# Patient Record
Sex: Female | Born: 1935
Health system: Southern US, Community
[De-identification: ages and names within clinical notes are randomized; demographics above are authoritative.]

## PROBLEM LIST (undated history)

## (undated) DIAGNOSIS — G4733 Obstructive sleep apnea (adult) (pediatric): Secondary | ICD-10-CM

## (undated) DIAGNOSIS — Z9989 Dependence on other enabling machines and devices: Secondary | ICD-10-CM

## (undated) DIAGNOSIS — R51 Headache: Secondary | ICD-10-CM

## (undated) DIAGNOSIS — F32A Depression, unspecified: Secondary | ICD-10-CM

## (undated) DIAGNOSIS — J069 Acute upper respiratory infection, unspecified: Secondary | ICD-10-CM

## (undated) DIAGNOSIS — I517 Cardiomegaly: Secondary | ICD-10-CM

## (undated) DIAGNOSIS — I441 Atrioventricular block, second degree: Secondary | ICD-10-CM

## (undated) DIAGNOSIS — Z950 Presence of cardiac pacemaker: Secondary | ICD-10-CM

## (undated) DIAGNOSIS — I499 Cardiac arrhythmia, unspecified: Secondary | ICD-10-CM

## (undated) DIAGNOSIS — R519 Headache, unspecified: Secondary | ICD-10-CM

## (undated) DIAGNOSIS — G43109 Migraine with aura, not intractable, without status migrainosus: Secondary | ICD-10-CM

## (undated) DIAGNOSIS — G4730 Sleep apnea, unspecified: Secondary | ICD-10-CM

## (undated) DIAGNOSIS — M1990 Unspecified osteoarthritis, unspecified site: Secondary | ICD-10-CM

## (undated) DIAGNOSIS — N189 Chronic kidney disease, unspecified: Secondary | ICD-10-CM

## (undated) DIAGNOSIS — R0789 Other chest pain: Secondary | ICD-10-CM

## (undated) DIAGNOSIS — G8929 Other chronic pain: Secondary | ICD-10-CM

## (undated) DIAGNOSIS — E7800 Pure hypercholesterolemia, unspecified: Secondary | ICD-10-CM

## (undated) DIAGNOSIS — D649 Anemia, unspecified: Secondary | ICD-10-CM

## (undated) DIAGNOSIS — I10 Essential (primary) hypertension: Secondary | ICD-10-CM

## (undated) DIAGNOSIS — K219 Gastro-esophageal reflux disease without esophagitis: Secondary | ICD-10-CM

## (undated) DIAGNOSIS — E785 Hyperlipidemia, unspecified: Secondary | ICD-10-CM

## (undated) DIAGNOSIS — F329 Major depressive disorder, single episode, unspecified: Secondary | ICD-10-CM

## (undated) HISTORY — DX: Cardiomegaly: I51.7

## (undated) HISTORY — DX: Migraine with aura, not intractable, without status migrainosus: G43.109

## (undated) HISTORY — PX: FOOT SURGERY: SHX648

## (undated) HISTORY — DX: Headache, unspecified: R51.9

## (undated) HISTORY — DX: Pure hypercholesterolemia, unspecified: E78.00

## (undated) HISTORY — DX: Other chronic pain: G89.29

## (undated) HISTORY — PX: CARPAL TUNNEL RELEASE: SHX101

## (undated) HISTORY — DX: Hyperlipidemia, unspecified: E78.5

## (undated) HISTORY — PX: CATARACT EXTRACTION: SUR2

## (undated) HISTORY — DX: Essential (primary) hypertension: I10

## (undated) HISTORY — DX: Atrioventricular block, second degree: I44.1

## (undated) HISTORY — DX: Unspecified osteoarthritis, unspecified site: M19.90

## (undated) HISTORY — DX: Presence of cardiac pacemaker: Z95.0

## (undated) HISTORY — DX: Cardiac arrhythmia, unspecified: I49.9

## (undated) HISTORY — PX: HAND SURGERY: SHX662

## (undated) HISTORY — DX: Headache: R51

## (undated) HISTORY — DX: Chronic kidney disease, unspecified: N18.9

## (undated) HISTORY — PX: TOTAL KNEE ARTHROPLASTY: SHX125

## (undated) HISTORY — DX: Sleep apnea, unspecified: G47.30

## (undated) HISTORY — PX: KNEE SURGERY: SHX244

## (undated) HISTORY — DX: Obstructive sleep apnea (adult) (pediatric): G47.33

## (undated) HISTORY — DX: Other chest pain: R07.89

## (undated) HISTORY — DX: Dependence on other enabling machines and devices: Z99.89

---

## 1966-04-20 HISTORY — PX: TUBAL LIGATION: SHX77

## 1998-02-26 ENCOUNTER — Other Ambulatory Visit: Admission: RE | Admit: 1998-02-26 | Discharge: 1998-02-26 | Payer: Self-pay | Admitting: Obstetrics and Gynecology

## 1998-10-18 ENCOUNTER — Ambulatory Visit (HOSPITAL_COMMUNITY): Admission: RE | Admit: 1998-10-18 | Discharge: 1998-10-18 | Payer: Self-pay | Admitting: Gastroenterology

## 1998-10-18 ENCOUNTER — Encounter (INDEPENDENT_AMBULATORY_CARE_PROVIDER_SITE_OTHER): Payer: Self-pay | Admitting: Specialist

## 1999-05-13 ENCOUNTER — Other Ambulatory Visit: Admission: RE | Admit: 1999-05-13 | Discharge: 1999-05-13 | Payer: Self-pay | Admitting: Obstetrics and Gynecology

## 2000-05-14 ENCOUNTER — Other Ambulatory Visit: Admission: RE | Admit: 2000-05-14 | Discharge: 2000-05-14 | Payer: Self-pay | Admitting: Obstetrics and Gynecology

## 2000-11-14 ENCOUNTER — Ambulatory Visit (HOSPITAL_BASED_OUTPATIENT_CLINIC_OR_DEPARTMENT_OTHER): Admission: RE | Admit: 2000-11-14 | Discharge: 2000-11-14 | Payer: Self-pay | Admitting: Internal Medicine

## 2001-05-16 ENCOUNTER — Other Ambulatory Visit: Admission: RE | Admit: 2001-05-16 | Discharge: 2001-05-16 | Payer: Self-pay | Admitting: Obstetrics and Gynecology

## 2002-05-16 ENCOUNTER — Other Ambulatory Visit: Admission: RE | Admit: 2002-05-16 | Discharge: 2002-05-16 | Payer: Self-pay | Admitting: Obstetrics and Gynecology

## 2003-03-13 ENCOUNTER — Ambulatory Visit (HOSPITAL_COMMUNITY): Admission: RE | Admit: 2003-03-13 | Discharge: 2003-03-13 | Payer: Self-pay | Admitting: Gastroenterology

## 2003-03-13 ENCOUNTER — Encounter (INDEPENDENT_AMBULATORY_CARE_PROVIDER_SITE_OTHER): Payer: Self-pay | Admitting: *Deleted

## 2003-05-18 ENCOUNTER — Other Ambulatory Visit: Admission: RE | Admit: 2003-05-18 | Discharge: 2003-05-18 | Payer: Self-pay | Admitting: Obstetrics and Gynecology

## 2004-02-04 ENCOUNTER — Encounter (INDEPENDENT_AMBULATORY_CARE_PROVIDER_SITE_OTHER): Payer: Self-pay | Admitting: *Deleted

## 2004-02-04 ENCOUNTER — Ambulatory Visit (HOSPITAL_COMMUNITY): Admission: RE | Admit: 2004-02-04 | Discharge: 2004-02-04 | Payer: Self-pay | Admitting: Gastroenterology

## 2004-05-28 ENCOUNTER — Other Ambulatory Visit: Admission: RE | Admit: 2004-05-28 | Discharge: 2004-05-28 | Payer: Self-pay | Admitting: Obstetrics and Gynecology

## 2005-05-22 ENCOUNTER — Other Ambulatory Visit: Admission: RE | Admit: 2005-05-22 | Discharge: 2005-05-22 | Payer: Self-pay | Admitting: Obstetrics and Gynecology

## 2006-05-26 ENCOUNTER — Other Ambulatory Visit: Admission: RE | Admit: 2006-05-26 | Discharge: 2006-05-26 | Payer: Self-pay | Admitting: Obstetrics and Gynecology

## 2006-06-23 ENCOUNTER — Ambulatory Visit (HOSPITAL_COMMUNITY): Admission: RE | Admit: 2006-06-23 | Discharge: 2006-06-23 | Payer: Self-pay | Admitting: Gastroenterology

## 2008-04-20 HISTORY — PX: PACEMAKER INSERTION: SHX728

## 2008-06-04 ENCOUNTER — Encounter: Payer: Self-pay | Admitting: Cardiology

## 2008-06-04 ENCOUNTER — Inpatient Hospital Stay (HOSPITAL_COMMUNITY): Admission: EM | Admit: 2008-06-04 | Discharge: 2008-06-07 | Payer: Self-pay | Admitting: Emergency Medicine

## 2008-07-16 ENCOUNTER — Ambulatory Visit: Payer: Self-pay | Admitting: Obstetrics and Gynecology

## 2008-07-16 ENCOUNTER — Other Ambulatory Visit: Admission: RE | Admit: 2008-07-16 | Discharge: 2008-07-16 | Payer: Self-pay | Admitting: Obstetrics and Gynecology

## 2008-07-16 ENCOUNTER — Encounter: Payer: Self-pay | Admitting: Obstetrics and Gynecology

## 2009-02-15 ENCOUNTER — Ambulatory Visit: Payer: Self-pay | Admitting: Obstetrics and Gynecology

## 2009-02-18 ENCOUNTER — Ambulatory Visit: Payer: Self-pay | Admitting: Obstetrics and Gynecology

## 2009-02-28 ENCOUNTER — Ambulatory Visit: Payer: Self-pay | Admitting: Obstetrics and Gynecology

## 2009-07-31 ENCOUNTER — Ambulatory Visit: Payer: Self-pay | Admitting: Obstetrics and Gynecology

## 2010-02-16 ENCOUNTER — Encounter: Payer: Self-pay | Admitting: Internal Medicine

## 2010-02-17 ENCOUNTER — Ambulatory Visit: Payer: Self-pay | Admitting: Internal Medicine

## 2010-03-03 ENCOUNTER — Encounter: Payer: Self-pay | Admitting: Internal Medicine

## 2010-03-03 ENCOUNTER — Ambulatory Visit: Payer: Self-pay | Admitting: Cardiology

## 2010-03-18 ENCOUNTER — Ambulatory Visit (HOSPITAL_COMMUNITY)
Admission: RE | Admit: 2010-03-18 | Discharge: 2010-03-18 | Payer: Self-pay | Source: Home / Self Care | Admitting: Internal Medicine

## 2010-04-07 ENCOUNTER — Encounter: Payer: Self-pay | Admitting: Internal Medicine

## 2010-05-08 ENCOUNTER — Encounter
Admission: RE | Admit: 2010-05-08 | Discharge: 2010-05-20 | Payer: Self-pay | Source: Home / Self Care | Attending: Orthopedic Surgery | Admitting: Orthopedic Surgery

## 2010-05-20 NOTE — Cardiovascular Report (Signed)
Summary: Office Visit Remote   Office Visit Remote   Imported By: Roderic Ovens 03/03/2010 17:01:47  _____________________________________________________________________  External Attachment:    Type:   Image     Comment:   External Document

## 2010-05-20 NOTE — Letter (Signed)
Summary: Remote Device Check  [employer], Main Office  [address]   [place], [place] [address]   Phone: [phone]  Fax: [phone]     March 03, 2010 MRN: [id number]   [facility] [address] [place], [place]  [address]   Dear Ms. Handlin,   Your remote transmission was recieved and reviewed by your physician.  All diagnostics were within normal limits for you.  _____Your next transmission is scheduled for:                       .  Please transmit at any time this day.  If you have a wireless device your transmission will be sent automatically.  ___X___Your next office visit is scheduled for: February 2012 with Dr Johney Frame. Please call our office to schedule an appointment.    Sincerely,  Vella Kohler

## 2010-05-21 ENCOUNTER — Ambulatory Visit: Payer: Medicare Other | Attending: Internal Medicine | Admitting: Physical Therapy

## 2010-05-21 DIAGNOSIS — R262 Difficulty in walking, not elsewhere classified: Secondary | ICD-10-CM | POA: Insufficient documentation

## 2010-05-21 DIAGNOSIS — M25569 Pain in unspecified knee: Secondary | ICD-10-CM | POA: Insufficient documentation

## 2010-05-21 DIAGNOSIS — IMO0001 Reserved for inherently not codable concepts without codable children: Secondary | ICD-10-CM | POA: Insufficient documentation

## 2010-05-22 NOTE — Miscellaneous (Signed)
Summary: Device preload  Clinical Lists Changes  Observations: Added new observation of PPM INDICATN: Mobitz II (04/07/2010 11:48) Added new observation of MAGNET RTE: BOL 85 ERI 65 (04/07/2010 11:48) Added new observation of PPMLEADSTAT2: active (04/07/2010 11:48) Added new observation of PPMLEADSER2: [id number] (04/07/2010 11:48) Added new observation of PPMLEADMOD2: 5076  (04/07/2010 11:48) Added new observation of PPMLEADDOI2: 06/06/2008  (04/07/2010 11:48) Added new observation of PPMLEADLOC2: RV  (04/07/2010 11:48) Added new observation of PPMLEADSTAT1: active  (04/07/2010 11:48) Added new observation of PPMLEADSER1: [id number]  (04/07/2010 11:48) Added new observation of PPMLEADMOD1: 5076  (04/07/2010 11:48) Added new observation of PPMLEADDOI1: 06/06/2008  (04/07/2010 11:48) Added new observation of PPMLEADLOC1: RA  (04/07/2010 11:48) Added new observation of PPM DOI: 06/06/2008  (04/07/2010 11:48) Added new observation of PPM SERL#: [id number]  (04/07/2010 11:48) Added new observation of PPM MODL#: [id number]  (04/07/2010 96:29) Added new observation of PPM IMP MD: Charlynn Court  (04/07/2010 11:48) Added new observation of PACEMAKERMFG: Medtronic  (04/07/2010 11:48) Added new observation of PPM REFER MD: Villa Herb  (04/07/2010 11:48) Added new observation of PACEMAKER MD: Hillis Range, MD  (04/07/2010 11:48)      PPM Specifications Following MD:  Hillis Range, MD     Referring MD:  Villa Herb PPM Vendor:  Medtronic     PPM Model Number:  [id number]     PPM Serial Number:  [id number] PPM DOI:  06/06/2008     PPM Implanting MD:  Charlynn Court  Lead 1    Location: RA     DOI: 06/06/2008     Model #: [id number]     Serial #: [id number]     Status: active Lead 2    Location: RV     DOI: 06/06/2008     Model #: [id number]     Serial #: [id number]     Status: active  Magnet Response Rate:  BOL 85 ERI 65  Indications:  Mobitz II

## 2010-05-26 ENCOUNTER — Ambulatory Visit: Payer: Medicare Other | Admitting: Physical Therapy

## 2010-05-29 ENCOUNTER — Encounter: Payer: Medicare Other | Admitting: Physical Therapy

## 2010-06-02 ENCOUNTER — Encounter (INDEPENDENT_AMBULATORY_CARE_PROVIDER_SITE_OTHER): Payer: Medicare Other | Admitting: Internal Medicine

## 2010-06-02 ENCOUNTER — Encounter: Payer: Self-pay | Admitting: Internal Medicine

## 2010-06-02 DIAGNOSIS — E785 Hyperlipidemia, unspecified: Secondary | ICD-10-CM | POA: Insufficient documentation

## 2010-06-02 DIAGNOSIS — I10 Essential (primary) hypertension: Secondary | ICD-10-CM

## 2010-06-02 DIAGNOSIS — R609 Edema, unspecified: Secondary | ICD-10-CM | POA: Insufficient documentation

## 2010-06-02 DIAGNOSIS — I441 Atrioventricular block, second degree: Secondary | ICD-10-CM | POA: Insufficient documentation

## 2010-06-11 NOTE — Assessment & Plan Note (Signed)
Summary: gso card pt/mdt ppm/r.s from bumplist gd/kwb   Visit Type:  Device check Referring Provider:  Dr Patty Sermons   History of Present Illness: [name] is a pleasant 76 yo WF with a h/o mobitz II second degree AV block who presents to establish care in the EP device clinic.  She was found to have mobitz II AV block and underwent PPM implantation (MDT) by Dr Reyes Ivan 06/06/08.  She reports doing very well since having her pacemaker implantation.  She denies CP, palpitations, dizziness, presyncope, or syncope.  She reports dypsnea with moderate activity which is stable.  She denies orthopnea, or PND.  She has stable edema.  She has chronic bilateral knee pain which limits mobility.  She is otherwise without complaint today.  Current Medications (verified): 1)  Atenolol 50 Mg Tabs (Atenolol) .... Take 1 Tablet By Mouth Two Times A Day 2)  Benicar Hct 40-12.5 Mg Tabs (Olmesartan Medoxomil-Hctz) .... Take 1 Tablet By Mouth Once A Day 3)  Naproxen Dr 500 Mg Tbec (Naproxen) .... Take 1 Tablet By Mouth Two Times A Day 4)  Omeprazole 20 Mg Tbec (Omeprazole) .... Take 1 Tablet By Mouth Two Times A Day 5)  Simvastatin 40 Mg Tabs (Simvastatin) .... Take 1 Tablet By Mouth Once A Day 6)  Biotin 5000 5 Mg Caps (Biotin) .... Take 1 Capsule By Mouth Once A Day 7)  Calcium 600+d Plus Minerals 600-400 Mg-Unit Chew (Calcium Carbonate-Vit D-Min) .... Take 1 Tablet By Mouth Once A Day 8)  Fish Oil 1200 Mg Caps (Omega-3 Fatty Acids) .... Take 1 Capsule By Mouth Once A Day 9)  Iron 325 (65 Fe) Mg Tabs (Ferrous Sulfate) .... Take 1 Tablet By Mouth Once A Day 10)  Multivitamins  Tabs (Multiple Vitamin) .... Take 1 Tablet By Mouth Once A Day  Allergies (verified): No Known Drug Allergies  Past History:  Past Medical History: Mobitz II AV block s/p PPM (MDT) by Dr Reyes Ivan 06/06/08 obesity DJD HTN HL OSA  Past Surgical History: Mobitz II AV block s/p PPM (MDT) by Dr Reyes Ivan 06/06/08  Family  History: CAD  Social History: denies TED  Review of Systems       All systems are reviewed and negative except as listed in the HPI.   Vital Signs:  Patient profile:   76 year old female Height:      61 inches Weight:      219.75 pounds BMI:     41.67 Pulse rate:   71 / minute Pulse rhythm:   regular Resp:     18 per minute BP sitting:   138 / 73  (left arm) Cuff size:   large  Vitals Entered By: Vikki Ports (June 02, 2010 3:38 PM)  Physical Exam  General:  obese, pleasant Head:  normocephalic and atraumatic Eyes:  PERRLA/EOM intact; conjunctiva and lids normal. Mouth:  Teeth, gums and palate normal. Oral mucosa normal. Neck:  supple Lungs:  Clear bilaterally to auscultation and percussion. Heart:  RRR, no m/r/g Abdomen:  Bowel sounds positive; abdomen soft and non-tender without masses, organomegaly, or hernias noted. No hepatosplenomegaly. Msk:  Back normal, normal gait. Muscle strength and tone normal. Extremities:  No clubbing or cyanosis. 1+ BLE edema Neurologic:  Alert and oriented x 3. Skin:  Intact without lesions or rashes. Psych:  Normal affect.   PPM Specifications Following MD:  Hillis Range, MD     Referring MD:  Villa Herb PPM Vendor:  Medtronic     PPM  Model Number:  ADDRL1     PPM Serial Number:  [id number] PPM DOI:  06/06/2008     PPM Implanting MD:  Charlynn Court  Lead 1    Location: RA     DOI: 06/06/2008     Model #: 8413     Serial #: [id number]     Status: active Lead 2    Location: RV     DOI: 06/06/2008     Model #: 3664     Serial #: [id number]     Status: active  Magnet Response Rate:  BOL 85 ERI 65  Indications:  Mobitz II  MD Comments:  see scanned report  Impression & Recommendations:  Problem # 1:  MOBITZ II ATRIOVENTRICULAR BLOCK (ICD-426.12) normal pacemaker function see scanned report  Problem # 2:  ESSENTIAL HYPERTENSION, BENIGN (ICD-401.1) above goal likely worsened by NSAIDS avoid NSAIDS as  above consider tylenol for pain weight loss also advised salt restriction  Problem # 3:  EDEMA (ICD-782.3) likely worsened by NSAIDS avoid NSAIDS as able  Problem # 4:  HYPERLIPIDEMIA (ICD-272.4) stable Her updated medication list for this problem includes:    Simvastatin 40 Mg Tabs (Simvastatin) .Marland Kitchen... Take 1 tablet by mouth once a day  Patient Instructions: 1)  Your physician wants you to follow-up in:12 months with Dr.Dare Sanger   You will receive a reminder letter in the mail two months in advance. If you don't receive a letter, please call our office to schedule the follow-up appointment.

## 2010-06-26 NOTE — Cardiovascular Report (Signed)
Summary: Office Visit   Office Visit   Imported By: Roderic Ovens 06/20/2010 12:34:51  _____________________________________________________________________  External Attachment:    Type:   Image     Comment:   External Document

## 2010-08-05 LAB — CARDIAC PANEL(CRET KIN+CKTOT+MB+TROPI)
CK, MB: 1.8 ng/mL (ref 0.3–4.0)
CK, MB: 1.7 ng/mL (ref 0.3–4.0)
Relative Index: INVALID (ref 0.0–2.5)
Relative Index: INVALID (ref 0.0–2.5)
Total CK: 85 U/L (ref 7–177)
Total CK: 72 U/L (ref 7–177)
Troponin I: <0.01 ng/mL (ref 0.00–0.06)
Troponin I: <0.01 ng/mL (ref 0.00–0.06)

## 2010-08-05 LAB — CBC
HCT: 38.4 % (ref 36.0–46.0)
HCT: 36.6 % (ref 36.0–46.0)
Hemoglobin: 13.5 g/dL (ref 12.0–15.0)
Hemoglobin: 12.9 g/dL (ref 12.0–15.0)
MCHC: 35.2 g/dL (ref 30.0–36.0)
MCHC: 35.2 g/dL (ref 30.0–36.0)
MCV: 87.6 fL (ref 78.0–100.0)
MCV: 86.8 fL (ref 78.0–100.0)
Platelets: 247 10*3/uL (ref 150–400)
Platelets: 242 10*3/uL (ref 150–400)
RBC: 4.38 MIL/uL (ref 3.87–5.11)
RBC: 4.22 MIL/uL (ref 3.87–5.11)
RDW: 13.9 % (ref 11.5–15.5)
RDW: 13.7 % (ref 11.5–15.5)
WBC: 5.3 10*3/uL (ref 4.0–10.5)
WBC: 4.8 10*3/uL (ref 4.0–10.5)

## 2010-08-05 LAB — BASIC METABOLIC PANEL
BUN: 15 mg/dL (ref 6–23)
CO2: 23 mEq/L (ref 19–32)
Calcium: 9.7 mg/dL (ref 8.4–10.5)
Chloride: 110 mEq/L (ref 96–112)
Creatinine, Ser: 0.92 mg/dL (ref 0.4–1.2)
GFR calc Af Amer: >60 mL/min (ref >60)
GFR calc non Af Amer: >60 mL/min (ref >60)
Glucose, Bld: 111 mg/dL — ABNORMAL HIGH (ref 70–99)
Potassium: 4.3 mEq/L (ref 3.5–5.1)
Sodium: 140 mEq/L (ref 135–145)

## 2010-08-05 LAB — APTT: aPTT: 29 seconds (ref 24–37)

## 2010-08-05 LAB — GLUCOSE, CAPILLARY
Glucose-Capillary: 80 mg/dL (ref 70–99)
Glucose-Capillary: 92 mg/dL (ref 70–99)

## 2010-08-05 LAB — DIFFERENTIAL
Basophils Absolute: 0.1 10*3/uL (ref 0.0–0.1)
Basophils Relative: 1 % (ref 0–1)
Eosinophils Absolute: 0.3 10*3/uL (ref 0.0–0.7)
Eosinophils Relative: 5 % (ref 0–5)
Lymphocytes Relative: 35 % (ref 12–46)
Lymphs Abs: 1.9 10*3/uL (ref 0.7–4.0)
Monocytes Absolute: 0.5 10*3/uL (ref 0.1–1.0)
Monocytes Relative: 9 % (ref 3–12)
Neutro Abs: 2.6 10*3/uL (ref 1.7–7.7)
Neutrophils Relative %: 49 % (ref 43–77)

## 2010-08-05 LAB — COMPREHENSIVE METABOLIC PANEL
ALT: 13 U/L (ref 0–35)
AST: 26 U/L (ref 0–37)
Albumin: 3.8 g/dL (ref 3.5–5.2)
Alkaline Phosphatase: 24 U/L — ABNORMAL LOW (ref 39–117)
BUN: 14 mg/dL (ref 6–23)
CO2: 25 mEq/L (ref 19–32)
Calcium: 9.8 mg/dL (ref 8.4–10.5)
Chloride: 108 mEq/L (ref 96–112)
Creatinine, Ser: 0.98 mg/dL (ref 0.4–1.2)
GFR calc Af Amer: >60 mL/min (ref >60)
GFR calc non Af Amer: 56 mL/min — ABNORMAL LOW (ref >60)
Glucose, Bld: 102 mg/dL — ABNORMAL HIGH (ref 70–99)
Potassium: 4.6 mEq/L (ref 3.5–5.1)
Sodium: 141 mEq/L (ref 135–145)
Total Bilirubin: 0.8 mg/dL (ref 0.3–1.2)
Total Protein: 6.6 g/dL (ref 6.0–8.3)

## 2010-08-05 LAB — T4, FREE: Free T4: 1.1 ng/dL (ref 0.89–1.80)

## 2010-08-05 LAB — LIPID PANEL
Cholesterol: 171 mg/dL (ref 0–200)
HDL: 47 mg/dL (ref >39)
LDL Cholesterol: 95 mg/dL (ref 0–99)
Total CHOL/HDL Ratio: 3.6 RATIO
Triglycerides: 144 mg/dL (ref <150)
VLDL: 29 mg/dL (ref 0–40)

## 2010-08-05 LAB — BRAIN NATRIURETIC PEPTIDE: Pro B Natriuretic peptide (BNP): 151 pg/mL — ABNORMAL HIGH (ref 0.0–100.0)

## 2010-08-05 LAB — TROPONIN I: Troponin I: <0.01 ng/mL (ref 0.00–0.06)

## 2010-08-05 LAB — HEMOGLOBIN A1C
Hgb A1c MFr Bld: 5.7 % (ref 4.6–6.1)
Mean Plasma Glucose: 117 mg/dL

## 2010-08-05 LAB — CK TOTAL AND CKMB (NOT AT ARMC)
CK, MB: 2.2 ng/mL (ref 0.3–4.0)
Relative Index: INVALID (ref 0.0–2.5)
Total CK: 89 U/L (ref 7–177)

## 2010-08-05 LAB — HEMOCCULT GUIAC POC 1CARD (OFFICE): Fecal Occult Bld: NEGATIVE

## 2010-08-05 LAB — PROTIME-INR
INR: 1 (ref 0.00–1.49)
Prothrombin Time: 13.4 seconds (ref 11.6–15.2)

## 2010-08-05 LAB — POCT CARDIAC MARKERS
CKMB, poc: 1.2 ng/mL (ref 1.0–8.0)
Myoglobin, poc: 75.3 ng/mL (ref 12–200)
Troponin i, poc: <0.05 ng/mL (ref 0.00–0.09)

## 2010-08-05 LAB — D-DIMER, QUANTITATIVE: D-Dimer, Quant: 0.38 ug/mL-FEU (ref 0.00–0.48)

## 2010-08-05 LAB — TSH: TSH: 2.64 u[IU]/mL (ref 0.350–4.500)

## 2010-08-05 LAB — MAGNESIUM: Magnesium: 2.3 mg/dL (ref 1.5–2.5)

## 2010-08-06 ENCOUNTER — Encounter: Payer: Self-pay | Admitting: Cardiology

## 2010-08-07 ENCOUNTER — Other Ambulatory Visit: Payer: Self-pay | Admitting: Obstetrics and Gynecology

## 2010-08-07 ENCOUNTER — Encounter: Payer: Medicare Other | Admitting: Obstetrics and Gynecology

## 2010-08-07 ENCOUNTER — Encounter (INDEPENDENT_AMBULATORY_CARE_PROVIDER_SITE_OTHER): Payer: Medicare Other | Admitting: Obstetrics and Gynecology

## 2010-08-07 ENCOUNTER — Other Ambulatory Visit (HOSPITAL_COMMUNITY)
Admission: RE | Admit: 2010-08-07 | Discharge: 2010-08-07 | Disposition: A | Discharge status: Home / Self Care | Payer: Medicare Other | Source: Ambulatory Visit | Attending: Obstetrics and Gynecology | Admitting: Obstetrics and Gynecology

## 2010-08-07 DIAGNOSIS — B373 Candidiasis of vulva and vagina: Secondary | ICD-10-CM

## 2010-08-07 DIAGNOSIS — N898 Other specified noninflammatory disorders of vagina: Secondary | ICD-10-CM

## 2010-08-07 DIAGNOSIS — Z124 Encounter for screening for malignant neoplasm of cervix: Secondary | ICD-10-CM | POA: Insufficient documentation

## 2010-08-07 DIAGNOSIS — N3941 Urge incontinence: Secondary | ICD-10-CM

## 2010-08-07 DIAGNOSIS — N390 Urinary tract infection, site not specified: Secondary | ICD-10-CM

## 2010-08-19 ENCOUNTER — Encounter: Payer: Self-pay | Admitting: Internal Medicine

## 2010-08-19 ENCOUNTER — Ambulatory Visit (INDEPENDENT_AMBULATORY_CARE_PROVIDER_SITE_OTHER): Payer: Medicare Other | Admitting: Internal Medicine

## 2010-08-19 VITALS — BP 126/70 | HR 69 | Ht 61.0 in | Wt 219.0 lb

## 2010-08-19 DIAGNOSIS — G4733 Obstructive sleep apnea (adult) (pediatric): Secondary | ICD-10-CM

## 2010-08-19 DIAGNOSIS — K219 Gastro-esophageal reflux disease without esophagitis: Secondary | ICD-10-CM

## 2010-08-19 DIAGNOSIS — R05 Cough: Secondary | ICD-10-CM

## 2010-08-19 NOTE — Progress Notes (Signed)
Subjective:    Patient ID: [name], female    DOB: 08-Jan-1935, 76 y.o.   MRN: [id number]  HPI 58 yoF here with husband on kind referral by Dr Eloise Harman for pulmonary consultation because of cough. Never smoker. Describes onset of cough about 2 years ago. Persistent through most days, without defined aggravating factors. Had seasonal allergic rhinitis mainly in Spring years ago, but denies hx of asthma or pneumonia.  PFT at [facility] 03/18/10 showed mild reversible small  airway obstruction. Has been treated for GERD with dilatation of stricture twice, most recently 2 years ago- she isn't sure if that timing relates to onset of cough. Increasing omeprazole to twice daily may have helped dough. Notes cough on first waking in AM, then through day. Not related to eating. Good and bad days with no pattern. Feels throat tickle then coughs, without any sense of deep chest involvement. Denies nose or sinus discomfort, drainage, etc. Some short of breath with activity, but without wheeze and she attributes it to her level of fitness and obesity. Has OSA/ CPAP- she doesn't think this is the problem. Coughs little at night. CXR 03/03/10- reported mild CE. Pacemaker, spondylosis. No active process.   Review of Systems See HPI Constitutional:   No weight loss, night sweats,  Fevers, chills, fatigue, lassitude. HEENT:   No headaches,  Difficulty swallowing,  Tooth/dental problems,  Sore throat,                No sneezing, itching, ear ache, nasal congestion, post nasal drip,   CV:  No chest pain,  Orthopnea, PND, swelling in lower extremities, anasarca, dizziness, palpitations  GI  No heartburn, indigestion, abdominal pain, nausea, vomiting, diarrhea, change in bowel habits, loss of appetite  Resp: No acute shortness of breath with exertion or at rest.  No excess mucus, no productive cough,   No coughing up of blood.  No change in color of mucus.  No wheezing.    Skin: no rash or lesions.  GU: no dysuria,  change in color of urine, no urgency or frequency.  No flank pain.  MS:  No joint pain or swelling.  No decreased range of motion.  No back pain.  Psych:  No change in mood or affect. No depression or anxiety.  No memory loss.      Objective:   Physical Exam General- Alert, Oriented, Affect-appropriate, Distress- none acute,  obese  Skin- rash-none, lesions- none, excoriation- none  Lymphadenopathy- none  Head- atraumatic  Eyes- Gross vision intact, PERRLA, conjunctivae clear secretions  Ears- Normal- Hearing, canals, Tm L ,   R ,  Nose- Clear, No-Septal dev, mucus, polyps, erosion, perforation   Throat- Mallampati IV , mucosa clear , drainage- none, tonsils- atrophic  Neck- flexible , trachea midline, no stridor , thyroid nl, carotid no bruit  Chest - symmetrical excursion , unlabored     Heart/CV- RRR , no murmur , no gallop  , no rub, nl s1 s2                     - JVD- none , edema- none, stasis changes- none, varices- none     Lung- Trace crackle in bases, unlabored, wheeze- none, cough- none , dullness-none, rub- none     Chest wall- Abd- tender-no, distended-no, bowel sounds-present, HSM- no  Br/ Gen/ Rectal- Not done, not indicated  Extrem- cyanosis- none, clubbing, none, atrophy- none, strength- nl  Neuro- grossly intact to observation  Assessment & Plan:

## 2010-08-19 NOTE — Patient Instructions (Signed)
Sample Spiriva once daily. Please call and let us know if this helps.  Try using a sugar-free hard candy instead of peppermint

## 2010-08-21 ENCOUNTER — Encounter: Payer: Self-pay | Admitting: *Deleted

## 2010-08-22 ENCOUNTER — Ambulatory Visit (INDEPENDENT_AMBULATORY_CARE_PROVIDER_SITE_OTHER): Payer: Medicare Other | Admitting: Cardiology

## 2010-08-22 ENCOUNTER — Encounter: Payer: Self-pay | Admitting: Cardiology

## 2010-08-22 VITALS — BP 130/72 | HR 67 | Wt 218.0 lb

## 2010-08-22 DIAGNOSIS — M1990 Unspecified osteoarthritis, unspecified site: Secondary | ICD-10-CM | POA: Insufficient documentation

## 2010-08-22 DIAGNOSIS — I119 Hypertensive heart disease without heart failure: Secondary | ICD-10-CM

## 2010-08-22 DIAGNOSIS — I10 Essential (primary) hypertension: Secondary | ICD-10-CM

## 2010-08-22 NOTE — Assessment & Plan Note (Signed)
This elderly woman has been experiencing severe pain in her right knee.  She is scheduled for right total knee replacement on 11/17/10.  She comes in for preoperative cardiac clearance.  She has not been expressing any recent chest pain or shortness of breath.  She does have a history of atrioventricular node disease and has a functioning dual-chamber pacemaker.

## 2010-08-22 NOTE — Progress Notes (Signed)
[name] Date of Birth:  12-18-1934 [facility] [address]   [address] [place], [place]  [address] [phone]           Fax   [phone]  HPI: This pleasant 76 year old Caucasian female is seen for a six-month followup office visit and for preoperative cardiac clearance prior to scheduled right total knee replacement on 11/17/10.  She has a past history of intermittent high-grade AV block and has a functioning dual-chamber pacemaker.  She has a history of essential hypertension and history of hypercholesterolemia.  She's also had a history of an intermittent cough she does have a CPAP machine.  She had recent pulmonary function studies which were done at [facility] which she states was okay.  She recently saw Dr. Levonne Spiller young who Gave her a trial of Spiriva.  She is not on ACE inhibitors.  She does not have any history of ischemic heart disease.  She had a normal LexiScan Cardiolite stress test on 05/24/09.  This was done as preop clearance for hand surgery.  She had a echocardiogram on 06/04/08 which showed normal ejection fraction of 65% and no left ventricular regional wall motion abnormalities.  Left ventricular diastolic function parameters were normal.   Current Outpatient Prescriptions  Medication Sig Dispense Refill  . amLODipine (NORVASC) 5 MG tablet Take 5 mg by mouth daily.        Marland Kitchen aspirin 81 MG tablet Take 81 mg by mouth daily.        Marland Kitchen atenolol (TENORMIN) 50 MG tablet Take 100 mg by mouth daily.        . Biotin 5000 MCG TABS Take 1 tablet by mouth daily.        . Calcium Carbonate-Vitamin D (CALCIUM 600+D) 600-400 MG-UNIT per tablet Take 1 tablet by mouth daily.        . Multiple Vitamin (MULTIVITAMIN PO) Take 1 tablet by mouth daily.        . naproxen (NAPROSYN) 500 MG tablet Take 500 mg by mouth 2 (two) times daily with a meal.        . Omega-3 Fatty Acids (FISH OIL) 1200 MG CAPS Take 1 capsule by mouth daily.        Marland Kitchen omeprazole  (PRILOSEC) 20 MG capsule Take 20 mg by mouth 2 (two) times daily.       . simvastatin (ZOCOR) 40 MG tablet Take 40 mg by mouth at bedtime.        Marland Kitchen telmisartan-hydrochlorothiazide (MICARDIS HCT) 80-12.5 MG per tablet Take 1 tablet by mouth daily.        . Tiotropium Bromide Monohydrate (SPIRIVA HANDIHALER IN) Inhale into the lungs.        . Ferrous Sulfate (IRON) 325 (65 FE) MG TABS Take 1 tablet by mouth daily.          Allergies  Allergen Reactions  . Amoxicillin     Patient Active Problem List  Diagnoses  . HYPERLIPIDEMIA  . Essential hypertension, benign  . MOBITZ II ATRIOVENTRICULAR BLOCK  . EDEMA  . Osteoarthritis    History  Smoking status  . Never Smoker   Smokeless tobacco  . Not on file    History  Alcohol Use: Not on file    Family History  Problem Relation Age of Onset  . Emphysema Father   . Allergies Father   . Heart disease Father   . Heart disease Mother     Review of Systems: The  patient denies any heat or cold intolerance.  No weight gain or weight loss.  The patient denies headaches or blurry vision.  There is no cough or sputum production.  The patient denies dizziness.  There is no hematuria or hematochezia.  The patient denies any muscle aches or arthritis.  The patient denies any rash.  The patient denies frequent falling or instability.  There is no history of depression or anxiety.  All other systems were reviewed and are negative.   Physical Exam: Filed Vitals:   08/22/10 1033  BP: 130/72  Pulse: 67  The general appearance feels a well-developed well-nourished woman in no distress.Pupils equal and reactive.   Extraocular Movements are full.  There is no scleral icterus.  The mouth and pharynx are normal.  The neck is supple.  The carotids reveal no bruits.  The jugular venous pressure is normal.  The thyroid is not enlarged.  There is no lymphadenopathy.The chest is clear to percussion and auscultation. There are no rales or rhonchi.  Expansion of the chest is symmetrical.The precordium is quiet.  The first heart sound is normal.  The second heart sound is physiologically split.  There is no murmur gallop rub or click.  There is no abnormal lift or heave.The abdomen is soft and nontender. Bowel sounds are normal. The liver and spleen are not enlarged. There Are no abdominal masses. There are no bruits.The pedal pulses are good.  There is no phlebitis or edema.  There is no cyanosis or clubbing.Strength is normal and symmetrical in all extremities.  There is no lateralizing weakness.  There are no sensory deficits.The skin is warm and dry.  There is no rash.  EKG today shows an AV paced rhythm at 67 per minute.    Assessment / Plan:  My impression is that the patient is doing well.  She is cleared for orthopedic surgery.  No further cardiac testing needs to be done.  She is to continue her same medications.  She'll be rechecked here in 6 months.

## 2010-08-22 NOTE — Assessment & Plan Note (Signed)
The patient has a past history of essential hypertension.  He is presently on Micardis HCT 80 mg/12.5 mg one daily and her blood pressure has been remaining stable she's not having the dizzy spells or syncope.

## 2010-08-23 ENCOUNTER — Encounter: Payer: Self-pay | Admitting: Internal Medicine

## 2010-08-23 DIAGNOSIS — G4733 Obstructive sleep apnea (adult) (pediatric): Secondary | ICD-10-CM | POA: Insufficient documentation

## 2010-08-23 DIAGNOSIS — R05 Cough: Secondary | ICD-10-CM | POA: Insufficient documentation

## 2010-08-23 DIAGNOSIS — K219 Gastro-esophageal reflux disease without esophagitis: Secondary | ICD-10-CM | POA: Insufficient documentation

## 2010-08-23 NOTE — Assessment & Plan Note (Signed)
She reports compliance with CPAP

## 2010-08-23 NOTE — Assessment & Plan Note (Signed)
Nonspecific cough. Clues are her small airways reactive disease, seen on PFT, without previous asthma. This may respond to a bronchodilator. Also there may be relation to her hx of GERD with stricture dilatation, although she doesn't recognize it. I have asked her to stop use of peppermint lozenges, which may relax the LES. We will try Spiriva. If that doesn't help consider methacholine challenge.

## 2010-08-29 ENCOUNTER — Encounter: Payer: Self-pay | Admitting: Internal Medicine

## 2010-09-02 ENCOUNTER — Telehealth: Payer: Self-pay | Admitting: Internal Medicine

## 2010-09-02 NOTE — H&P (Signed)
NAMEKAMRIN, SPATH               ACCOUNT NO.:  [id number]   MEDICAL RECORD NO.:  [id number]          PATIENT TYPE:  INP   LOCATION:  2039                         FACILITY:  [facility]   PHYSICIAN:  Cassell Clement, M.D. DATE OF BIRTH:  1935/02/04   DATE OF ADMISSION:  06/04/2008  DATE OF DISCHARGE:                              HISTORY & PHYSICAL   ADDENDUM   PHYSICAL EXAMINATION:  VITAL SIGNS:  Blood pressure is 156/80, pulse is  76 and regular with transient drop in pulse to 38 on telemetry monitor.  The respirations are normal.  GENERAL APPEARANCE:  A well-developed, somewhat overweight woman in no  acute distress.  SKIN:  Warm and dry.  HEENT:  The pupils are equal and reactive.  The extraocular movements  are full.  The sclerae are nonicteric.  Mouth and pharynx are normal.  NECK:  The jugular venous pressure is normal.  Carotids normal.  Thyroid  normal.  No lymphadenopathy.  CHEST:  Clear to percussion and auscultation.  HEART:  A quiet precordium.  The first sound is normal, second sound is  normal.  There is no murmur, gallop, rub, or click.  ABDOMEN:  Obese, nontender.  No hepatosplenomegaly.  No palpable mass.  The bowel sounds are active.  EXTREMITIES:  Good pedal pulses.  There is no phlebitis or edema.  There  is no cyanosis or clubbing.  There is no skin rash.  NEUROLOGIC:  Motor strength and sensation are intact.  The patient is  alert and oriented.           ______________________________  Cassell Clement, M.D.     TB/MEDQ  D:  06/05/2008  T:  06/06/2008  Job:  [id number]   cc:   Barry Dienes. Eloise Harman, M.D.

## 2010-09-02 NOTE — Telephone Encounter (Signed)
Spoke with pt.  She states that she has finished spiriva samples given to her at last ov on 08/19/10- she states that she feels that spiriva has helped with her am wheezing and her cough.  She would like rx for this sent to [facility]. She also wanted to let Dr Maple Hudson know that she is now taking micardis HCT for her BP. Med has already been added to her list. Pls advise if okay to send rx for spiriva, thanks! Allergies  Allergen Reactions  . Amoxicillin

## 2010-09-02 NOTE — H&P (Signed)
[name], [name]               ACCOUNT NO.:  [id number]   MEDICAL RECORD NO.:  [id number]          PATIENT TYPE:  OBV   LOCATION:  2039                         FACILITY:  [facility]   PHYSICIAN:  Cassell Clement, M.D. DATE OF BIRTH:  [birth year]   DATE OF ADMISSION:  06/04/2008  DATE OF DISCHARGE:                              HISTORY & PHYSICAL   CHIEF COMPLAINT:  Chest pain.   HISTORY OF PRESENT ILLNESS:  This is a 76 year old married Caucasian  female admitted with substernal chest pain.  She had the onset of the  discomfort 2 days ago on Saturday night, June 02, 2008, at around 7  p.m.  She was washing dishes at that time and noted a heaviness in her  chest associated with dyspnea.  She has sleep apnea and put herself on a  CPAP machine and obtained relief.  On the following evening, last  evening, she again had similar symptoms in the late afternoon and  evening and again put herself on her CPAP machine with relief of dyspnea  and chest discomfort.  This morning, she again had onset of chest  pressure and called Dr. Reuel Boom Paterson's office who advised her to come  to the emergency room.  The patient does not have any history of known  coronary artery disease.  She was seen in our office for the last time  on March 06, 2003, and at that time had a 1-day adenosine Cardiolite  SPECT imaging study which was negative for ischemia and showed LV  function to be vigorous.  She does have a history of hypertension.  She  has been on atenolol uncertain dose each evening and Benicar uncertain  dose each morning.  Other home meds include naproxen 500 mg b.i.d.,  omeprazole 20 mg daily, simvastatin uncertain dose at bedtime and she  also takes vitamins, fish oil, calcium, iron, and macular protection  plus multivitamin.   The social history reveals that she is retired from [employer] for  [employer]  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  She is  married, has 3 grown children.  She does not  use alcohol or tobacco.   The family history reveals her father had several heart attacks and died  of emphysema at 65.  Mother had an angina pectoris, but lived to age 53.   Previous surgery includes 2 cesarean sections, left knee arthroscopy,  and cataract surgery.   REVIEW OF SYSTEMS:  The patient does have a history of  hypercholesterolemia.  She has a history of obesity.  Her present weight  is about 210.  The review of systems reveals that the chest discomfort  that she experienced in the last 3 days intermittently was substernal,  did not radiate, was not associated with any nausea, vomiting, or  diaphoresis.   The gastrointestinal function reveals no constipation or change in bowel  habits, hematochezia, or melena.  She has urinary frequency during the  day, but rarely has any nocturia at night.  She has had a slight cough,  not productive of any sputum  and she did notice that when she coughed  the symptoms seemed to improve a little 2 nights ago despite the fact  she did not bring up any sputum.  The patient denies any dizziness or  syncope or Stokes-Adams attacks.   All other systems negative in detail.   Her chest x-ray shows cardiomegaly.  Her EKG shows normal sinus rhythm,  first-degree heart block with right bundle-branch block and she had a  right bundle-branch block in 2004 as well.  Her D-dimer is normal.  Initial point-of-care cardiac enzymes are normal.  Her white count is  53,00, hemoglobin 13.5, and platelets 247,000.  Myoglobin 75.  CK-MB is  1.2.  Troponin less than 0.05.  Sodium 143, potassium 4.3, blood sugar  111, BUN 15, and creatinine 0.92.  Occult blood is negative  Her D-dimer  is 0.38.  Protime is 13.4.  PTT 29.   IMPRESSION:  1. Atypical chest pain, rule out myocardial infarction, rule out      angina pectoris.  2. Transient second-degree heart block noted on the ER monitor during      the  examination.  It was transient, but it did reproduce somewhat      the symptoms that she had experienced earlier.  3. Old right bundle-branch block.  4. Essential hypertension with cardiomegaly.  5. Hyperlipidemia.  6. Sleep apnea.  7. Osteoarthritis.  8. Macular degeneration.   DISPOSITION:  We are admitting her to telemetry observation status.  We  will try to get a 2-D echo today to evaluate cardiomegaly.  We will get  serial cardiac enzymes.  We will check a B-natriuretic peptide and A1c  and lipid panel.  We will continue her home meds except for stopping her  atenolol because of the transient second-degree heart block noted on the  monitor.           ______________________________  Cassell Clement, M.D.     TB/MEDQ  D:  06/04/2008  T:  06/05/2008  Job:  [id number]   cc:   Barry Dienes. Eloise Harman, M.D.

## 2010-09-02 NOTE — Discharge Summary (Signed)
[name], [name]               ACCOUNT NO.:  [id number]   MEDICAL RECORD NO.:  [id number]          PATIENT TYPE:  INP   LOCATION:  2039                         FACILITY:  [facility]   PHYSICIAN:  Cassell Clement, M.D. DATE OF BIRTH:  [birth year]   DATE OF ADMISSION:  06/04/2008  DATE OF DISCHARGE:  06/07/2008                               DISCHARGE SUMMARY   FINAL DIAGNOSES:  1. Second-degree atrioventricular block.  2. Old right bundle-branch block.  3. Essential hypertension.  4. Cardiomegaly.  5. Atypical chest pain, myocardial infarction ruled out.  6. Hyperlipidemia.  7. Sleep apnea.  8. Osteoarthritis.  9. Macular degeneration.   OPERATIONS PERFORMED:  1. Two-dimensional echocardiogram.  2. Insertion of Medtronic dual-chamber pacemaker by Dr. Lady Deutscher      on June 06, 2008, for symptomatic second-degree heart block.   HISTORY:  This is a 76 year old Caucasian female admitted with atypical  chest pain.  She had been experiencing intermittent chest discomfort  associated with a heaviness in the chest and dyspnea for the past 48  hours.  Interestingly, she would put herself on her CPAP machine and  that seemed to give her relief.  On the morning of admission, she had  recurrence of the chest pressure and was advised to come to the  emergency room.  She denies any radiation of discomfort to the arms.  There has been no nausea, vomiting, or diaphoresis.  No fever, chills,  or bronchitis.   Home meds including atenolol, Benicar, Naprosyn, omeprazole,  simvastatin, and multiple vitamins and supplements.   PHYSICAL EXAMINATION:  VITAL SIGNS:  On admission, blood pressure of  150/80, pulse was 68 with normal sinus rhythm, and she was having short  runs of 3:1 heart block with pulse dropping into the high 30s and low  40s.  HEAD AND NECK:  Unremarkable.  Jugular venous pressure normal.  Carotids  normal.  LUNGS:  Clear.  HEART:  No murmur, gallop, or rub.  ABDOMEN:   Soft and nontender.  EXTREMITIES:  Good peripheral pulses.  No phlebitis or edema.  NEUROLOGIC:  Physiologic.   On admission, her chest x-ray showed cardiomegaly, but no CHF.  EKG  showed normal sinus rhythm with a first-degree heart block and with  intermittent second-degree block and she had a chronic right bundle-  branch block.  Her D-dimer was normal.  Cardiac point of care enzymes  were negative x1 on admission.   HOSPITAL COURSE:  The patient was admitted to telemetry.  She underwent  a 2-dimensional echocardiogram on the day of admission which showed good  left ventricular systolic function and no significant valve  abnormalities.  We stopped her atenolol on admission because of the  presence of his second-degree heart block.  We continued her on ARBs.  The patient continued to have runs of second-degree block.  It seemed  that her symptoms of chest heaviness were correlated with the episodes  of second-degree block and that her symptoms were related to her AV  block.  We looked for reversible causes of the AV block.  She was no  longer on  any beta blocker, but the AV block persisted.  Thyroid  function was normal.  It was felt that she would benefit from a dual-  chamber pacemaker and she was seen by Dr. Lady Deutscher, who felt that a  dual-chamber permanent pacemaker was indicated.  This was implanted by  Dr. Reyes Ivan on June 06, 2008.  It is a Medtronic unit.  The procedure  went well.  The following morning, she had a chest x-ray which showed no  pneumothorax and the pacemaker was interrogated and showed that it was  functioning normally.  The incision looked good.  Blood pressure  remained elevated and we are going to resume her beta blocker at  discharge to help normalize blood pressure.   The patient will be sent home with careful instructions regarding  limitation of movement of the left arm for the next week and with  instructions not to get the incision wet.  The  incision will be painted  with Betadine daily for 3 days at home.  The patient will return to work  on Wednesday, June 20, 2008.  The patient will be seen in my office in 1  week for a followup office visit and EKG.   DISCHARGE MEDICATIONS:  1. Benicar 40 mg daily.  2. Atenolol 50 mg nightly.  3. Aspirin 81 mg daily.  4. Naprosyn 500 mg twice a day.  5. Omeprazole 20 mg daily.  6. Simvastatin 40 mg nightly.  7. Tylenol as needed for pain.  Continue home multivitamins and      supplements and she will continue use of her CPAP machine at home.   TIME SPENT IN DISCUSSING DISCHARGE PLANS AND PREPARING DISCHARGE PAPERS:  30 minutes.   CONDITION ON DISCHARGED:  Improved.           ______________________________  Cassell Clement, M.D.     TB/MEDQ  D:  06/07/2008  T:  06/07/2008  Job:  [id number]   cc:   Elmore Guise., M.D.  Barry Dienes Eloise Harman, M.D.

## 2010-09-03 MED ORDER — TIOTROPIUM BROMIDE MONOHYDRATE 18 MCG IN CAPS: ORAL_CAPSULE | RESPIRATORY_TRACT | Status: DC

## 2010-09-03 NOTE — Telephone Encounter (Signed)
Lmomtcb x 1 

## 2010-09-03 NOTE — Telephone Encounter (Signed)
Ok to Rx Spiriva, # 30, 1 daily, refill prn

## 2010-09-04 ENCOUNTER — Encounter: Payer: Medicare Other | Admitting: *Deleted

## 2010-09-04 NOTE — Telephone Encounter (Signed)
Spoke w/ pt and she is aware rx was sent to pharmacy and needed nothing else further

## 2010-09-05 ENCOUNTER — Encounter: Payer: Self-pay | Admitting: *Deleted

## 2010-09-05 NOTE — Op Note (Signed)
NAMEKRISLYNN, [name]               ACCOUNT NO.:  [id number]   MEDICAL RECORD NO.:  [id number]          PATIENT TYPE:  AMB   LOCATION:  ENDO                         FACILITY:  [facility]   PHYSICIAN:  Petra Kuba, M.D.    DATE OF BIRTH:  06/04/1934   DATE OF PROCEDURE:  06/23/2006  DATE OF DISCHARGE:                               OPERATIVE REPORT   PROCEDURE:  Esophagogastroduodenoscopy with Savory dilatation.   INDICATION:  Recurrent dysphagia.  History of hiatal hernia and  stricture.  Consent was signed after risks, benefits, methods and  options thoroughly discussed multiple times in the past.   MEDICINES USED:  Fentanyl 50 mcg, Versed 5 mg.   PROCEDURE:  The video endoscope was inserted by direct vision.  The  proximal main esophagus was normal.  There was a widely patent ring just  above the small hiatal hernia.  The scope passed easily through and into  the stomach.  Advanced through a normal antrum, normal pylorus, into a  normal duodenal bulb and around the celiac to a normal second portion of  the duodenum.  The scope was slowly withdrawn back to the bulb and a  good look there ruled out abnormalities in that location.  The scope was  withdrawn back into the stomach and retroflexed.  Annularis, cardia,  fundus, lesser and greater curve were all normal, except for the hiatal  hernia being confirmed in the cardia.  Straight visualization of the  stomach did not reveal any additional findings. The scope was slowly  withdrawn back to 20 cm, again confirming the normal esophagus.  The  scope was readvanced into the stomach.  Under fluoro guidance, the  Savory wire was advanced and customary J loop was confirmed  endoscopically and under fluoro, scope was removed, making sure to keep  the wire in the proper location and then succession of the Savory 14 and  then 16 mm dilators were advanced and confirmed in proper position in  the stomach under fluoro.  Once the 16 was advanced  into the stomach,  the wire was withdrawn back into the dilator.  Both were removed in  tandem.  The procedure was terminated at this junction.  Both dilators  were passed with very minimal resistance and only trace of heme on the  16.  The patient tolerated the procedure well.  There was no obvious  immediate complication.   ENDOSCOPIC DIAGNOSES:  1. Small hiatal hernia with a widely patent ring.  2. Otherwise normal esophagogastroduodenoscopy and therapy Savory      dilatations seen under fluoroscopy.   PLAN:  Seeing how the dilation worked, followup with me p.r.n. or in 6  weeks and recheck symptoms to make sure no further workup plans or more  aggressive dilatations are needed.           ______________________________  Petra Kuba, M.D.     MEM/MEDQ  D:  06/23/2006  T:  06/23/2006  Job:  [id number]   cc:   Vania Rea. Jarold Motto, MD, Caleen Essex, FAGA

## 2010-09-05 NOTE — Op Note (Signed)
[name], [name]               ACCOUNT NO.:  [id number]   MEDICAL RECORD NO.:  [id number]          PATIENT TYPE:  AMB   LOCATION:  ENDO                         FACILITY:  [facility]   PHYSICIAN:  Petra Kuba, M.D.    DATE OF BIRTH:  Jan 11, 1935   DATE OF PROCEDURE:  DATE OF DISCHARGE:                                 OPERATIVE REPORT   PROCEDURE:  Colonoscopy with biopsy.   SURGEON:   INDICATIONS FOR PROCEDURE:  Screening.  Consent was signed after risks,  benefits and options were thoroughly discussed multiple times in the past.   MEDICATIONS:  Demerol 75 and Versed 5.   DESCRIPTION OF PROCEDURE:  Rectal inspection was pertinent for external  hemorrhoids, small.  Digital examination was negative.  Video pediatric  adjustable colonoscope was inserted and easily advanced around the colon to  the cecum.  This did require rolling her on her back and some abdominal  pressure.  No obvious abnormality was seen on insertion except for some rare  early left-sided diverticula.  The cecum was identified by the appendiceal  orifice and the ileocecal valve.  Prep was adequate.  There was some liquid  stool that required washing and suctioning.  On slow withdrawal through the  colon, in the cecal pole, a tiny polyp was seen and was cold biopsied x2.  The ascending colon was normal.  The mid transverse questionable polyp along  the fold was seen and cold biopsied x3, put in the same container.  Scope  was further withdrawn.  The rare early left-sided diverticula were  confirmed; in the mid sigmoid, another tiny polyp was seen, cold biopsied x2  put in the same container.  No other abnormalities were seen as we slowly  withdrew back to the rectum.  Anorectal pull through and retroflexion  confirmed some small hemorrhoids.  Scope was straightened and readvanced a  short ways up the left side of the colon.  Air was suctioned, scope removed.  The patient tolerated the procedure well.  There was no  obvious immediate  complication.   ENDOSCOPIC DIAGNOSES:  1.  Internal and external small hemorrhoids.  2.  Rare early left-sided diverticula.  3.  Three tiny questionable polyps in the sigmoid, transverse and cecum, all      cold biopsied.  4.  Otherwise within normal limits to the cecum.   PLAN:  Await pathology to determine future colonic screening.  Happy to see  back p.r.n. Otherwise return care to Dr. Eloise Harman for the customary health  care maintenance to include yearly rectals and guaiacs.       MEM/MEDQ  D:  02/04/2004  T:  02/04/2004  Job:  [id number]   cc:   Barry Dienes. Eloise Harman, M.D.  [address]  [place]  [place] [address]  Fax: [phone]

## 2010-09-05 NOTE — Op Note (Signed)
NAME:  Tracey, Levine                         ACCOUNT NO.:  [id number]   MEDICAL RECORD NO.:  [id number]                   PATIENT TYPE:  AMB   LOCATION:  ENDO                                 FACILITY:  [facility]   PHYSICIAN:  Petra Kuba, M.D.                 DATE OF BIRTH:  05-01-1934   DATE OF PROCEDURE:  03/13/2003  DATE OF DISCHARGE:                                 OPERATIVE REPORT   PROCEDURE:  Esophagogastroduodenoscopy with biopsy and Savary dilatation.   INDICATIONS:  Patient with increasing dysphagia with known hiatal hernia and  ring.  Consent was signed after risks, benefits, methods, and options  thoroughly discussed in the office.   MEDICINES USED:  Demerol 60 mg, Versed 6 mg.   DESCRIPTION OF PROCEDURE:  The video endoscope was inserted by direct  vision.  The proximal and midesophagus looked normal.  In the distal  esophagus was a small hiatal hernia and a widely patent fibrous ring.  The  scope passed easily through this area and into the antrum, pertinent for  some antral erosions, probably nonsteroidal-induced, through a normal  pylorus, into a normal duodenal bulb and around the C-loop to a normal  second portion of the duodenum.  The scope was withdrawn back to the bulb  and a good look there ruled out abnormalities in that location.  The scope  was withdrawn back to the stomach and retroflexed.  High in the cardia the  hiatal hernia was confirmed.  The fundus, angularis, lesser and greater  curve were normal on retroflexed visualization and straight visualization of  the stomach and pertinent for antral erosions and ruled out any additional  findings.  The scope was then slowly withdrawn back to the posterior  pharynx.  No additional esophageal findings were seen.  The scope was then  advanced to the antrum.  Savary wire was advanced.  The customary J-loop was  confirmed under fluoroscopy.  The scope was removed and in succession the  Savary 14 and 16 mm  dilators were advanced and confirmed in the proper  position in the stomach under fluoroscopy, and there was no resistance or  any heme on either of the dilators.  We went ahead and reinserted the scope  after the 16 and the wire were removed in tandem, and there was good  fracturing of the ring seen, no active bleeding or obvious significant tear.  We did go ahead and take four biopsies of the antral erosions into the  proximal stomach at this juncture to rule out Helicobacter and confirm the  antritis.  The scope was then slowly withdrawn.  Again a good look at the  ring confirmed its fracturing, no active bleeding.  The scope was further  withdrawn.  No additional findings were seen.  The patient tolerated the  procedure well.  There was no obvious immediate complication or problem.   ENDOSCOPIC DIAGNOSES:  1. Small hiatal hernia with widely patent fibrous ring.  2. Antral erosions, probably nonsteroidal-induced, status post biopsy.  3. Otherwise normal esophagogastroduodenoscopy with two biopsies in the     proximal stomach to rule out Helicobacter.   THERAPY:  Savary dilatation under fluoroscopy to 16 mm without heme or  resistance, with good fracturing of the ring post endoscopy, done more to  take biopsies than to reassess.   PLAN:  1. Continue Aciphex, since it seems to be helping.  2. Follow up p.r.n. or in two to three months to recheck symptoms and make     sure no further workup plans are needed.  3. Await pathology to see if Helicobacter is needed to be treated.                                               Petra Kuba, M.D.    MEM/MEDQ  D:  03/13/2003  T:  03/13/2003  Job:  [id number]   cc:   Barry Dienes. Eloise Harman, M.D.  [address]  [place]  [place] [address]  Fax: [phone]

## 2010-09-09 ENCOUNTER — Encounter: Payer: Self-pay | Admitting: Internal Medicine

## 2010-09-14 ENCOUNTER — Other Ambulatory Visit: Payer: Self-pay | Admitting: Internal Medicine

## 2010-09-16 ENCOUNTER — Ambulatory Visit (INDEPENDENT_AMBULATORY_CARE_PROVIDER_SITE_OTHER): Payer: Medicare Other | Admitting: *Deleted

## 2010-09-16 DIAGNOSIS — I441 Atrioventricular block, second degree: Secondary | ICD-10-CM

## 2010-09-16 NOTE — Progress Notes (Signed)
Pacer remote check  

## 2010-09-30 ENCOUNTER — Encounter: Payer: Self-pay | Admitting: *Deleted

## 2010-09-30 ENCOUNTER — Encounter: Payer: Self-pay | Admitting: Internal Medicine

## 2010-09-30 ENCOUNTER — Ambulatory Visit (INDEPENDENT_AMBULATORY_CARE_PROVIDER_SITE_OTHER): Payer: Medicare Other | Admitting: Internal Medicine

## 2010-09-30 VITALS — BP 118/62 | HR 83 | Ht 61.0 in | Wt 215.2 lb

## 2010-09-30 DIAGNOSIS — R05 Cough: Secondary | ICD-10-CM

## 2010-09-30 DIAGNOSIS — R059 Cough, unspecified: Secondary | ICD-10-CM

## 2010-09-30 DIAGNOSIS — G4733 Obstructive sleep apnea (adult) (pediatric): Secondary | ICD-10-CM

## 2010-09-30 DIAGNOSIS — K219 Gastro-esophageal reflux disease without esophagitis: Secondary | ICD-10-CM

## 2010-09-30 NOTE — Assessment & Plan Note (Signed)
Respsonse to Spiriva suggests cholinergic/ vagal cough mechanism. This may be a cough equivalent asthma.    We can continue this since she feels she is now good enough

## 2010-09-30 NOTE — Progress Notes (Signed)
Subjective:    Patient ID: AIMAN SONN, female    DOB: Jul 20, 1934, 76 y.o.   MRN: [id number]  HPI    Review of Systems     Objective:   Physical Exam        Assessment & Plan:   Subjective:    Patient ID: MYRIAH BOGGUS, female    DOB: 1934-06-15, 76 y.o.   MRN: [id number]  HPI 08/19/10- 64 yoF here with husband on kind referral by Dr Eloise Harman for pulmonary consultation because of cough. Never smoker. Describes onset of cough about 2 years ago. Persistent through most days, without defined aggravating factors. Had seasonal allergic rhinitis mainly in Spring years ago, but denies hx of asthma or pneumonia.  PFT at [facility] 03/18/10 showed mild reversible small  airway obstruction. Has been treated for GERD with dilatation of stricture twice, most recently 2 years ago- she isn't sure if that timing relates to onset of cough. Increasing omeprazole to twice daily may have helped dough. Notes cough on first waking in AM, then through day. Not related to eating. Good and bad days with no pattern. Feels throat tickle then coughs, without any sense of deep chest involvement. Denies nose or sinus discomfort, drainage, etc. Some short of breath with activity, but without wheeze and she attributes it to her level of fitness and obesity. Has OSA/ CPAP- she doesn't think this is the problem. Coughs little at night. CXR 03/03/10- reported mild CE. Pacemaker, spondylosis. No active process.  09/30/10- Cough, GERD, OSA/ CPAP Changed her Spiriva from morning to bedtime. Cough and wheeze were mostly in morning. Wheeze is gone. She notes a little cough, especially with laughter. Cough is much better than before. Spiriva does dry her mouth.  Continues CPAP every night at 12. She is facing TKR right knee.   Review of Systems See HPI Constitutional:   No weight loss, night sweats,  Fevers, chills, fatigue, lassitude. HEENT:   No headaches,  Difficulty swallowing,  Tooth/dental problems,  Sore throat,           No sneezing, itching, ear ache, nasal congestion, post nasal drip,   CV:  No chest pain,  Orthopnea, PND, swelling in lower extremities, anasarca, dizziness, palpitations  GI  No heartburn, indigestion, abdominal pain, nausea, vomiting, diarrhea, change in bowel habits, loss of appetite  Resp: No acute shortness of breath with exertion or at rest.  No excess mucus, no productive cough,   No coughing up of blood.  No change in color of mucus.  No wheezing.    Skin: no rash or lesions.  GU: no dysuria, change in color of urine, no urgency or frequency.  No flank pain.  MS:  No joint pain or swelling.  No decreased range of motion.  No back pain.  Psych:  No change in mood or affect. No depression or anxiety.  No memory loss.      Objective:   Physical Exam General- Alert, Oriented, Affect-appropriate, Distress- none acute,  obese  Skin- rash-none, lesions- none, excoriation- none  Lymphadenopathy- none  Head- atraumatic  Eyes- Gross vision intact, PERRLA, conjunctivae clear secretions  Ears- Normal- Hearing, canals, Tm - normal  Nose- Clear, No-Septal dev, mucus, polyps, erosion, perforation   Throat- Mallampati IV , mucosa clear , drainage- none, tonsils- atrophic  Neck- flexible , trachea midline, no stridor , thyroid nl, carotid no bruit  Chest - symmetrical excursion , unlabored     Heart/CV- RRR , no murmur , no gallop  ,  no rub, nl s1 s2                     - JVD- none , edema- none, stasis changes- none, varices- none     Lung- Trace crackle in bases, unlabored, wheeze- none, cough- none , dullness-none, rub- none     Chest wall- Abd- tender-no, distended-no, bowel sounds-present, HSM- no  Br/ Gen/ Rectal- Not done, not indicated  Extrem- cyanosis- none, clubbing, none, atrophy- none, strength- nl  Neuro- grossly intact to observation         Assessment & Plan:

## 2010-09-30 NOTE — Patient Instructions (Signed)
Continue CPAP at 12. Let me have a copy of the download and I can follow you here as you request.   Consider trying Biotene for dry mouth- otc  Continue Spiriva

## 2010-09-30 NOTE — Assessment & Plan Note (Signed)
She is asking that I manage her CPAP. Pressure seems good. We discussed management through upcoming surgery.

## 2010-10-04 ENCOUNTER — Encounter: Payer: Self-pay | Admitting: Internal Medicine

## 2010-10-04 NOTE — Assessment & Plan Note (Signed)
  Reminded about reflux precautions.

## 2010-10-30 ENCOUNTER — Telehealth: Payer: Self-pay | Admitting: Cardiology

## 2010-10-30 NOTE — Telephone Encounter (Deleted)
OV [phone]

## 2010-11-05 ENCOUNTER — Other Ambulatory Visit: Payer: Self-pay | Admitting: Orthopedic Surgery

## 2010-11-05 ENCOUNTER — Encounter (HOSPITAL_COMMUNITY): Payer: Medicare Other

## 2010-11-05 LAB — URINALYSIS, ROUTINE W REFLEX MICROSCOPIC
Bilirubin Urine: NEGATIVE
Nitrite: NEGATIVE
Protein, ur: NEGATIVE mg/dL
Specific Gravity, Urine: 1.024 (ref 1.005–1.030)
Urobilinogen, UA: 1 mg/dL (ref 0.0–1.0)

## 2010-11-05 LAB — COMPREHENSIVE METABOLIC PANEL
Albumin: 3.6 g/dL (ref 3.5–5.2)
Alkaline Phosphatase: 29 U/L — ABNORMAL LOW (ref 39–117)
BUN: 32 mg/dL — ABNORMAL HIGH (ref 6–23)
Chloride: 102 mEq/L (ref 96–112)
Creatinine, Ser: 1.26 mg/dL — ABNORMAL HIGH (ref 0.50–1.10)
GFR calc Af Amer: 50 mL/min — ABNORMAL LOW (ref >60)
Glucose, Bld: 124 mg/dL — ABNORMAL HIGH (ref 70–99)
Potassium: 4.1 mEq/L (ref 3.5–5.1)
Total Bilirubin: 0.3 mg/dL (ref 0.3–1.2)

## 2010-11-05 LAB — CBC
MCHC: 32.8 g/dL (ref 30.0–36.0)
Platelets: 296 10*3/uL (ref 150–400)
RDW: 14.2 % (ref 11.5–15.5)
WBC: 8.7 10*3/uL (ref 4.0–10.5)

## 2010-11-05 LAB — PROTIME-INR
INR: 1.12 (ref 0.00–1.49)
Prothrombin Time: 14.6 seconds (ref 11.6–15.2)

## 2010-11-05 LAB — SURGICAL PCR SCREEN
MRSA, PCR: NEGATIVE
Staphylococcus aureus: NEGATIVE

## 2010-11-17 ENCOUNTER — Inpatient Hospital Stay (HOSPITAL_COMMUNITY)
Admission: RE | Admit: 2010-11-17 | Discharge: 2010-11-20 | DRG: 470 | Disposition: A | Discharge status: Home Health | Payer: Medicare Other | Source: Ambulatory Visit | Attending: Orthopedic Surgery | Admitting: Orthopedic Surgery

## 2010-11-17 DIAGNOSIS — Z780 Asymptomatic menopausal state: Secondary | ICD-10-CM

## 2010-11-17 DIAGNOSIS — H547 Unspecified visual loss: Secondary | ICD-10-CM | POA: Diagnosis present

## 2010-11-17 DIAGNOSIS — G43909 Migraine, unspecified, not intractable, without status migrainosus: Secondary | ICD-10-CM | POA: Diagnosis present

## 2010-11-17 DIAGNOSIS — H9190 Unspecified hearing loss, unspecified ear: Secondary | ICD-10-CM | POA: Diagnosis present

## 2010-11-17 DIAGNOSIS — E871 Hypo-osmolality and hyponatremia: Secondary | ICD-10-CM | POA: Diagnosis not present

## 2010-11-17 DIAGNOSIS — Z01812 Encounter for preprocedural laboratory examination: Secondary | ICD-10-CM

## 2010-11-17 DIAGNOSIS — K219 Gastro-esophageal reflux disease without esophagitis: Secondary | ICD-10-CM | POA: Diagnosis present

## 2010-11-17 DIAGNOSIS — E7800 Pure hypercholesterolemia, unspecified: Secondary | ICD-10-CM | POA: Diagnosis present

## 2010-11-17 DIAGNOSIS — H3530 Unspecified macular degeneration: Secondary | ICD-10-CM | POA: Diagnosis present

## 2010-11-17 DIAGNOSIS — Z79899 Other long term (current) drug therapy: Secondary | ICD-10-CM

## 2010-11-17 DIAGNOSIS — K449 Diaphragmatic hernia without obstruction or gangrene: Secondary | ICD-10-CM | POA: Diagnosis present

## 2010-11-17 DIAGNOSIS — Z7982 Long term (current) use of aspirin: Secondary | ICD-10-CM

## 2010-11-17 DIAGNOSIS — I10 Essential (primary) hypertension: Secondary | ICD-10-CM | POA: Diagnosis present

## 2010-11-17 DIAGNOSIS — Z950 Presence of cardiac pacemaker: Secondary | ICD-10-CM

## 2010-11-17 DIAGNOSIS — G4733 Obstructive sleep apnea (adult) (pediatric): Secondary | ICD-10-CM | POA: Diagnosis present

## 2010-11-17 DIAGNOSIS — M1710 Unilateral primary osteoarthritis, unspecified knee: Principal | ICD-10-CM | POA: Diagnosis present

## 2010-11-17 LAB — TYPE AND SCREEN

## 2010-11-17 LAB — PROTIME-INR: INR: 1.18 (ref 0.00–1.49)

## 2010-11-17 NOTE — Telephone Encounter (Signed)
No note required for this encounter.

## 2010-11-17 NOTE — Op Note (Signed)
[name], Levine NO.:  [id number]  MEDICAL RECORD NO.:  [id number]  LOCATION:  0010                         FACILITY:  [facility]  PHYSICIAN:  Ollen Gross, M.D.    DATE OF BIRTH:  07/29/1934  DATE OF PROCEDURE:  11/17/2010 DATE OF DISCHARGE:                              OPERATIVE REPORT   PREOPERATIVE DIAGNOSIS:  Osteoarthritis, right knee.  POSTOPERATIVE DIAGNOSIS:  Osteoarthritis, right knee.  PROCEDURE:  Right total knee arthroplasty.  SURGEON:  Ollen Gross, M.D.  ASSISTANT:  Alexzandrew L. Perkins, P.A.C.  ANESTHESIA:  Attempted spinal, conversion to general.  ESTIMATED BLOOD LOSS:  Minimal.  DRAIN:  Hemovac x1.  TOURNIQUET TIME:  41 minutes at 300 mmHg.  COMPLICATIONS:  None.  CONDITION.:  Stable to recovery.  BRIEF CLINICAL NOTE:  [name] is a 76 year old female with advanced end-stage arthritis of the right knee with progressively worsening pain and dysfunction.  She has failed nonoperative management and presents for right total knee arthroplasty.  PROCEDURE IN DETAIL:  After attempted administration of spinal anesthetic, a tourniquet was placed high on her right thigh.  Right lower extremity prepped and draped in usual sterile fashion.  I tested by pinching the skin and she felt that, so was converted to general. Extremity was wrapped in Esmarch, knee flexed, tourniquet inflated to 300 mmHg.  Midline incision was made with a 10 blade through subcutaneous tissue to the level of the extensor mechanism.  Fresh blade was used make a medial parapatellar arthrotomy.  Soft tissue on the proximal medial tibia was subperiosteally elevated to the joint line with the knife and into the semimembranosus bursa with a Cobb elevator. Soft tissue laterally was elevated with attention being paid to avoid patellar tendon on tibial tubercle.  Patella was everted, knee flexed 90 degrees, ACL and PCL removed.  Drill was used to create a starting  hole in distal femur.  Canal was thoroughly irrigated.  A 5 degrees right valgus alignment guide was placed and the distal femoral cutting block pinned to remove 10 mm off the distal femur.  Distal femoral resection was made with an oscillating saw.  Tibia subluxed forward, menisci removed.  Extramedullary tibial alignment guide was placed referencing proximally at the medial aspect of tibial tubercle and distally along the second metatarsal axis, tibial crest.  Block was pinned to remove 2 mm off the more deficient medial side.  Tibial resection was made with an oscillating saw.  Size 2.5 was most appropriate tibial component.  Proximal tibia was prepared with a modular drill and keel punch for the size 2.5.  Femoral sizing guide was placed, size 2 was the most appropriate. Rotation was marked of the epicondylar axis.  Size 2 cutting block was placed and the anterior-posterior chamfer cuts made.  Intercondylar block was placed and that cut was made.  Trial size 2 posterior stabilized femur was placed.  A 10-mm posterior stabilized rotating platform insert trial was placed.  With the 10, full extension was achieved with excellent varus-valgus and anterior-posterior balance throughout full range of motion.  Patella was everted and thickness measured to be 22 mm.  Freehand resection was taken to 12 mm, 35 template  was placed, lug holes were drilled, trial patella was placed and it tracks normally.  Osteophytes were removed off the posterior femur with the trial in place.  All trials were removed and the cut bone surfaces prepared with pulsatile lavage.  Cement was mixed and once ready for implantation, the size 2.5 mobile bearing tibial tray, size 2 posterior stabilized femur and 35 patella were cemented into place. Patella was held with the clamp.  Trial 10-mm insert was placed, knee held in full extension, all extruded cement removed.  When the cement fully hardened, then the permanent  10-mm posterior stabilized rotating platform insert was placed in the tibial tray.  Wound was copiously irrigated with saline solution and the arthrotomy closed over Hemovac drain with interrupted #1 PDS.  Flexion against gravity was to 135 degrees.  Patella tracks normally.  Tourniquet was released after total time of 41 minutes.  Subcu was closed with interrupted 2-0 Vicryl, subcuticular running 4-0 Monocryl.  The incision were cleaned and dried and Steri-Strips and bulky sterile dressing were applied.  Catheter for Marcaine pain pump was placed and pump initiated.  Incisions were cleaned and dried.  Steri-Strips applied.  Bulky sterile dressing applied.  She was placed into a knee immobilizer, awakened and transported to recovery in stable condition.     Ollen Gross, M.D.     FA/MEDQ  D:  11/17/2010  T:  11/17/2010  Job:  [id number]  Electronically Signed by Ollen Gross M.D. on 11/17/2010 05:34:50 PM

## 2010-11-18 LAB — BASIC METABOLIC PANEL
CO2: 26 mEq/L (ref 19–32)
Calcium: 9.1 mg/dL (ref 8.4–10.5)
Chloride: 100 mEq/L (ref 96–112)
Glucose, Bld: 134 mg/dL — ABNORMAL HIGH (ref 70–99)
Potassium: 4.1 mEq/L (ref 3.5–5.1)
Sodium: 133 mEq/L — ABNORMAL LOW (ref 135–145)

## 2010-11-18 LAB — PROTIME-INR: INR: 1.15 (ref 0.00–1.49)

## 2010-11-18 LAB — CBC
HCT: 32.8 % — ABNORMAL LOW (ref 36.0–46.0)
Hemoglobin: 11.3 g/dL — ABNORMAL LOW (ref 12.0–15.0)
MCV: 85.4 fL (ref 78.0–100.0)
RBC: 3.84 MIL/uL — ABNORMAL LOW (ref 3.87–5.11)
RDW: 13.8 % (ref 11.5–15.5)
WBC: 10.2 10*3/uL (ref 4.0–10.5)

## 2010-11-19 LAB — BASIC METABOLIC PANEL
CO2: 29 mEq/L (ref 19–32)
Calcium: 9.5 mg/dL (ref 8.4–10.5)
Creatinine, Ser: 0.8 mg/dL (ref 0.50–1.10)
GFR calc non Af Amer: >60 mL/min (ref >60)

## 2010-11-19 LAB — CBC
Hemoglobin: 10.9 g/dL — ABNORMAL LOW (ref 12.0–15.0)
MCH: 28.6 pg (ref 26.0–34.0)
MCV: 87.1 fL (ref 78.0–100.0)
Platelets: 267 10*3/uL (ref 150–400)
RBC: 3.81 MIL/uL — ABNORMAL LOW (ref 3.87–5.11)
WBC: 11 10*3/uL — ABNORMAL HIGH (ref 4.0–10.5)

## 2010-11-20 LAB — CBC
MCH: 28.2 pg (ref 26.0–34.0)
MCV: 86.1 fL (ref 78.0–100.0)
Platelets: 301 10*3/uL (ref 150–400)
RDW: 13.9 % (ref 11.5–15.5)
WBC: 11 10*3/uL — ABNORMAL HIGH (ref 4.0–10.5)

## 2010-12-01 NOTE — Discharge Summary (Signed)
[name], [name]               ACCOUNT NO.:  [id number]  MEDICAL RECORD NO.:  [id number]  LOCATION:  1614                         FACILITY:  [facility]  PHYSICIAN:  Ollen Gross, M.D.    DATE OF BIRTH:  04-30-1934  DATE OF ADMISSION:  11/17/2010 DATE OF DISCHARGE:  11/20/2010                              DISCHARGE SUMMARY   DATE OF DISCHARGE:  November 20, 2010  ADMITTING DIAGNOSES: 1. Osteoarthritis bilateral knees, right greater than left. 2. Migraines. 3. Impaired hearing. 4. Impaired vision. 5. Tinnitus. 6. Macular degeneration. 7. Right eye cataract. 8. Sleep apnea. 9. Hypertension. 10.Hypercholesterolemia. 11.Pacemaker placement secondary to bradycardia/sick sinus syndrome. 12.Hiatal hernia. 13.Reflux disease. 14.Hemorrhoids. 15.Childhood illnesses of measles and mumps. 16.Postmenopausal.  DISCHARGE DIAGNOSIS: 1. Osteoarthritis right knee status post right total knee replacement     arthroplasty. 2. Postop hyponatremia improved. 3. Migraines. 4. Impaired hearing. 5. Impaired vision. 6. Tinnitus. 7. Macular degeneration. 8. Right eye cataract. 9. Sleep apnea. 10.Hypertension. 11.Hypercholesterolemia. 12.Pacemaker placement secondary to bradycardia/sick sinus syndrome. 13.Hiatal hernia. 14.Reflux disease. 15.Hemorrhoids. 16.Childhood illnesses of measles and mumps. 17.Postmenopausal.  PROCEDURE:  November 17, 2010, right total knee.  SURGEON:  Ollen Gross, M.D.  ASSISTANT:  Alexzandrew L. Perkins, P.A.C.  ANESTHESIA:  Surgery was done under attempted spinal but was converted over to general.  TOURNIQUET TIME:  41 minutes.  CONSULTS:  None.  BRIEF HISTORY:  The patient is a 76 year old female with advanced arthritis of right knee, progressive worsening pain dysfunction, failed nonoperative management, now presents for total knee arthroplasty.  LABORATORY DATA:  Preop CBC showed hemoglobin 12.3, hematocrit 37.5, white cell count 8.7, platelets 296.   PT/INR 14.6 and 1.12 with PTT of 34.  Chem panel on admission, elevated glucose of 124, elevated BUN and creatinine of 32 and 1.26 respectively, slightly low alk phos of 29. Remaining chem panel within normal limits.  Preop UA negative.  Blood group type A negative.  Nasal swabs were negative staph aureus, negative for MRSA.  Serial CBCs were followed.  Hemoglobin dropped down to 11.3, then 10.9, last H and H back up to 11.4 and 34.8.  Serial protimes followed per Coumadin protocol.  Last PT/INR 28.3 and 2.6.  Serial BMPs were followed for 48 hours.  Electrolytes showed sodium dropped 138 - 133, back up to 136.  Sodium 97.  BUN and creatinine came down to normal levels of 10 and 0.8.  EKG dated Aug 22, 2010, no interpretation, appears to be in sinus rhythm, confirmed by Dr. Patty Sermons.  HOSPITAL COURSE:  The patient on the date of admission was taken to the OR, underwent above procedure without complications.  The patient tolerated the procedure well, later transferred to the orthopedic floor, started on p.o. and IV analgesics, given 24 hours postop IV antibiotics. Started Coumadin for DVT prophylaxis.  She did have some pain through the night but actually got better by the next morning.  Sodium was down a little bit, but he had good urine output.  She was started back on home meds, started back on CPAP since she has had a history of sleep apnea.  She started to get up out of bed on day one, by day two  actually doing a little bit better.  Dressing changed, incision looked good.  She was already walking about 80 feet by day two.  No signs of infection. Hemoglobin was stable and progressing well.  By day three she was seen on rounds by Dr. Lequita Halt, she was meeting her goals with therapy.  She was tolerating her meds and discharged home.  DISCHARGE/PLAN: 1. She was discharged home on November 20, 2010. 2. Discharge diagnoses, please see above. 3. Discharge meds, OxyIR for pain, Robaxin for  spasm, Coumadin for DVT     prophylaxis.  Continue home meds of Spiriva, simvastatin,     omeprazole, Micardis, HCTZ, atenolol, amlodipine.  DIET:  Heart-healthy cardiac diet.  FOLLOWUP:  In 2 weeks.  ACTIVITY:  She is to weight-bear as tolerated, total knee protocol. Home health PT.  DISPOSITION:  Home.  CONDITION ON DISCHARGE:  Improved.     Alexzandrew L. Julien Girt, P.A.C.   ______________________________ Ollen Gross, M.D.    ALP/MEDQ  D:  11/20/2010  T:  11/21/2010  Job:  [id number]  cc:   Barry Dienes. Eloise Harman, M.D. Fax: [phone]  Clinton D. Maple Hudson, MD, FCCP, FACP [facility] [address] [place] [address]  Cassell Clement, M.D. Fax: [phone]  Electronically Signed by Patrica Duel P.A.C. on 11/27/2010 07:41:35 AM Electronically Signed by Ollen Gross M.D. on 12/01/2010 11:38:35 AM

## 2010-12-01 NOTE — H&P (Signed)
[name], [name]               ACCOUNT NO.:  [id number]  MEDICAL RECORD NO.:  [id number]  LOCATION:                                 FACILITY:  PHYSICIAN:  Ollen Gross, M.D.    DATE OF BIRTH:  Nov 14, 1934  DATE OF ADMISSION:  11/17/2010 DATE OF DISCHARGE:                             HISTORY & PHYSICAL   CHIEF COMPLAINT:  Right greater than left knee pain.  HISTORY OF PRESENT ILLNESS:  The patient is a 76 year old female who has been seen by Dr. Lequita Halt for ongoing bilateral knee pain.  She has had problems with her knees, dating back for several years now.  She has been undergone a left knee arthroscopy in the past and unfortunately her knee is progressively gotten worse with time.  It is felt, she would benefit from undergoing surgical intervention.  X-rays show end-stage arthritis and she elects to proceed with surgery.  ALLERGIES:  No known drug allergies.  INTOLERANCES:  CODEINE causes nausea, but she has taken Vicodin in the past without difficulty.  CURRENT MEDICATIONS:  Naproxen, simvastatin, atenolol, omeprazole, baby aspirin, tramadol, calcium plus D,multivitamin, fish oil. She also takes Spiriva, she usually takes for a week on and then a week off.  PAST MEDICAL HISTORY: 1. Migraines. 2. Impaired hearing, uses hearing aids. 3. Impaired vision, uses glasses. 4. Tinnitus. 5. Macular degeneration. 6. Right eye cataract. 7. Sleep apnea, which she uses CPAP. 8. Hypertension. 9. Hypercholesterolemia. 10.Pacemaker placement secondary to bradycardia/sick sinus syndrome. 11.Hiatal hernia. 12.Reflux disease. 13.Hemorrhoids. 14.Childhood illnesses measles, mumps, postmenopausal.  PAST SURGICAL HISTORY:  Pacemaker placement, knee scope, foot surgery, cesarean section x2.  FAMILY HISTORY:  Father with heart disease.  Mother with breast cancer.  SOCIAL HISTORY:  Nonsmoker.  No alcohol.  Three children.  Husband will be assisting with care.  She has two steps  entering onto the porch and one into her home.  REVIEW OF SYSTEMS:  GENERAL:  No fever, chills, or night sweats.  NEURO: No seizure, syncope, or paralysis.  RESPIRATORY:  No shortness breath, productive cough, or hemoptysis.  CARDIOVASCULAR:  No chest pain, angina, or orthopnea.  GI:  No nausea, vomiting, diarrhea, or constipation.  GU:  No dysuria, hematuria, or discharge. MUSCULOSKELETAL:  Knee pain.  PHYSICAL EXAMINATION:  VITAL SIGNS:  Pulse 76, respirations 12, blood pressure 118/70. GENERAL:  A 76 year old white female, well nourished, well developed, in no acute stress.  She is alert, oriented, and cooperative.  Very pleasant.  Good historian.  Short statured.  Overweight. HEENT:  Normocephalic, atraumatic.  Pupils are round and reactive.  EOMs intact.  Never wore glasses, has bilateral hearing aids. NECK:  Supple.  No carotid bruits. CHEST:  Clear anterior and posterior chest walls.  No rhonchi, rales, or wheezing. HEART:  Regular rate and rhythm without murmur.  S1, S2 noted. ABDOMEN:  Soft, round, slightly protuberant.  Bowel sounds present. RECTAL/BREASTS/GENITALIA:  Not done, not pertinent to present illness. EXTREMITIES:  Right knee, no effusion, range of motion 10-120, marked crepitus, tender more medial than lateral.  Left knee, no effusion, range of motion 5-120, marked crepitus, tender more medial than lateral.  IMPRESSION:  Osteoarthritis, right knee greater than  left knee.  PLAN:  The patient will be admitted to [facility] to undergo a right total knee replacement arthroplasty.  Surgery will be performed by Dr. Ollen Gross.  She has had a clearance given to her by Dr. Eloise Harman, also clearance by Dr. Patty Sermons.  She does states that she has talked with Dr. Maple Hudson and he has requested that her CPAP be applied in the recovery room.     Alexzandrew L. Julien Girt, P.A.C.   ______________________________ Ollen Gross, M.D.    ALP/MEDQ  D:   11/16/2010  T:  11/17/2010  Job:  [id number]  cc:   Barry Dienes. Eloise Harman, M.D. Fax: [phone]  Clinton D. Maple Hudson, MD, FCCP, FACP [facility] [address] [place] [address]  Cassell Clement, M.D. Fax: [phone]  Electronically Signed by Patrica Duel P.A.C. on 11/20/2010 09:37:05 AM Electronically Signed by Ollen Gross M.D. on 12/01/2010 11:38:32 AM

## 2010-12-18 ENCOUNTER — Encounter: Payer: Self-pay | Admitting: Internal Medicine

## 2010-12-18 ENCOUNTER — Ambulatory Visit (INDEPENDENT_AMBULATORY_CARE_PROVIDER_SITE_OTHER): Payer: Medicare Other | Admitting: *Deleted

## 2010-12-18 ENCOUNTER — Other Ambulatory Visit: Payer: Self-pay

## 2010-12-18 DIAGNOSIS — I441 Atrioventricular block, second degree: Secondary | ICD-10-CM

## 2010-12-19 LAB — REMOTE PACEMAKER DEVICE
AL IMPEDENCE PM: 397 Ohm
ATRIAL PACING PM: 32
BATTERY VOLTAGE: 2.8 V

## 2010-12-24 ENCOUNTER — Encounter: Payer: Self-pay | Admitting: *Deleted

## 2011-01-01 NOTE — Progress Notes (Signed)
Pacer checked by remote 

## 2011-03-19 ENCOUNTER — Other Ambulatory Visit: Payer: Self-pay

## 2011-03-19 ENCOUNTER — Encounter: Payer: Self-pay | Admitting: Internal Medicine

## 2011-03-19 ENCOUNTER — Ambulatory Visit (INDEPENDENT_AMBULATORY_CARE_PROVIDER_SITE_OTHER): Payer: Medicare Other | Admitting: *Deleted

## 2011-03-19 DIAGNOSIS — I441 Atrioventricular block, second degree: Secondary | ICD-10-CM

## 2011-03-20 LAB — REMOTE PACEMAKER DEVICE
AL AMPLITUDE: 2.8 mv
AL IMPEDENCE PM: 413 Ohm
AL THRESHOLD: 0.5 V
ATRIAL PACING PM: 30
BAMS-0001: 175 {beats}/min
BATTERY VOLTAGE: 2.8 V
RV LEAD IMPEDENCE PM: 670 Ohm
RV LEAD THRESHOLD: 0.625 V
VENTRICULAR PACING PM: 99

## 2011-03-24 NOTE — Progress Notes (Signed)
Remote pacer check  

## 2011-03-31 ENCOUNTER — Ambulatory Visit (INDEPENDENT_AMBULATORY_CARE_PROVIDER_SITE_OTHER): Payer: Medicare Other | Admitting: Internal Medicine

## 2011-03-31 ENCOUNTER — Encounter: Payer: Self-pay | Admitting: Internal Medicine

## 2011-03-31 VITALS — BP 138/64 | HR 85 | Ht 60.0 in | Wt 203.6 lb

## 2011-03-31 DIAGNOSIS — R05 Cough: Secondary | ICD-10-CM

## 2011-03-31 DIAGNOSIS — G4733 Obstructive sleep apnea (adult) (pediatric): Secondary | ICD-10-CM

## 2011-03-31 NOTE — Progress Notes (Signed)
Patient ID: [name], female    DOB: 07/05/1934, 76 y.o.   MRN: [id number]  HPI 08/19/10- 79 yoF here with husband on kind referral by Dr Eloise Harman for pulmonary consultation because of cough. Never smoker. Describes onset of cough about 2 years ago. Persistent through most days, without defined aggravating factors. Had seasonal allergic rhinitis mainly in Spring years ago, but denies hx of asthma or pneumonia.  PFT at [facility] 03/18/10 showed mild reversible small  airway obstruction. Has been treated for GERD with dilatation of stricture twice, most recently 2 years ago- she isn't sure if that timing relates to onset of cough. Increasing omeprazole to twice daily may have helped dough. Notes cough on first waking in AM, then through day. Not related to eating. Good and bad days with no pattern. Feels throat tickle then coughs, without any sense of deep chest involvement. Denies nose or sinus discomfort, drainage, etc. Some short of breath with activity, but without wheeze and she attributes it to her level of fitness and obesity. Has OSA/ CPAP- she doesn't think this is the problem. Coughs little at night. CXR 03/03/10- reported mild CE. Pacemaker, spondylosis. No active process.  09/30/10- Cough, GERD, OSA/ CPAP Changed her Spiriva from morning to bedtime. Cough and wheeze were mostly in morning. Wheeze is gone. She notes a little cough, especially with laughter. Cough is much better than before. Spiriva does dry her mouth.  Continues CPAP every night at 12. She is facing TKR right knee.   03/31/11- 76 yoF never smoker followed for Cough, GERD, OSA/ CPAP Has had flu shot. Since last year has had a right total knee replacement without respiratory complication. She stopped Spiriva as unhelpful. She has a pacemaker but denies acute heart problems. She is going to a gym to ride an Administrator, arts. She has had no coughing lately.  Sleep apnea is well controlled using CPAP comfortably at 12 CWP, all night every  night/[employer].  Review of Systems See HPI Constitutional:   No-   weight loss, night sweats, fevers, chills, fatigue, lassitude. HEENT:   No-  headaches, difficulty swallowing, tooth/dental problems, sore throat,       No-  sneezing, itching, ear ache, nasal congestion, post nasal drip,  CV:  No-   chest pain, orthopnea, PND, swelling in lower extremities, anasarca,                                  dizziness, palpitations Resp: No-   shortness of breath with exertion or at rest.              No-   productive cough,  No non-productive cough,  No- coughing up of blood.              No-   change in color of mucus.  No- wheezing.   Skin: No-   rash or lesions. GI:  No-   heartburn, indigestion, abdominal pain, nausea, vomiting, diarrhea,                 change in bowel habits, loss of appetite GU: No-   dysuria, change in color of urine, no urgency or frequency.  No- flank pain. MS:  No-   joint pain or swelling.  No- decreased range of motion.  No- back pain. Neuro-     nothing unusual Psych:  No- change in mood or affect. No depression or anxiety.  No  memory loss.      Objective:   Physical Exam General- Alert, Oriented, Affect-appropriate, Distress- none acute, overweight Skin- rash-none, lesions- none, excoriation- none Lymphadenopathy- none Head- atraumatic            Eyes- Gross vision intact, PERRLA, conjunctivae clear secretions            Ears- Hearing, canals-normal            Nose- Clear, no-Septal dev, mucus, polyps, erosion, perforation             Throat- Mallampati III , mucosa clear , drainage- none, tonsils- atrophic Neck- flexible , trachea midline, no stridor , thyroid nl, carotid no bruit Chest - symmetrical excursion , unlabored           Heart/CV- RRR , no murmur , no gallop  , no rub, nl s1 s2                           - JVD- none , edema- none, stasis changes- none, varices- none           Lung- clear to P&A, wheeze- none, cough- none , dullness-none, rub- none            Chest wall- L pacemaker Abd- tender-no, distended-no, bowel sounds-present, HSM- no Br/ Gen/ Rectal- Not done, not indicated Extrem- cyanosis- none, clubbing, none, atrophy- none, strength- nl Neuro- grossly intact to observation

## 2011-03-31 NOTE — Patient Instructions (Signed)
Ok to restart Spiriva if needed for cough  Please ask Dr Silvano Rusk office to send a copy of your original diagnostic sleep study for our record.

## 2011-04-04 NOTE — Assessment & Plan Note (Signed)
Good compliance and control with CPAP. No problems identified. We reviewed medical indications for CPAP again.

## 2011-04-04 NOTE — Assessment & Plan Note (Signed)
Cough has been substantially controlled. We will address again as needed.

## 2011-04-16 ENCOUNTER — Encounter: Payer: Self-pay | Admitting: *Deleted

## 2011-05-19 ENCOUNTER — Ambulatory Visit (INDEPENDENT_AMBULATORY_CARE_PROVIDER_SITE_OTHER): Payer: Medicare Other | Admitting: Cardiology

## 2011-05-19 ENCOUNTER — Encounter: Payer: Self-pay | Admitting: Cardiology

## 2011-05-19 DIAGNOSIS — I119 Hypertensive heart disease without heart failure: Secondary | ICD-10-CM

## 2011-05-19 DIAGNOSIS — I441 Atrioventricular block, second degree: Secondary | ICD-10-CM

## 2011-05-19 DIAGNOSIS — I10 Essential (primary) hypertension: Secondary | ICD-10-CM

## 2011-05-19 DIAGNOSIS — E785 Hyperlipidemia, unspecified: Secondary | ICD-10-CM

## 2011-05-19 DIAGNOSIS — M1990 Unspecified osteoarthritis, unspecified site: Secondary | ICD-10-CM

## 2011-05-19 NOTE — Assessment & Plan Note (Signed)
The patient has not been experiencing any exertional chest pain or dyspnea.  She's not having any signs or symptoms of congestive heart failure.  Her blood pressure has been remaining stable on current therapy.

## 2011-05-19 NOTE — Progress Notes (Signed)
[name] Date of Birth:  08-12-1934 [facility] [address] [place], [place]  [address] [phone]  Fax   [phone]  HPI: This pleasant 77 year old woman is seen for a preoperative clearance office visit.  She is scheduled for left total knee replacement on 08/24/11 by Dr. Lequita Halt.  She has a past history of intermittent Mobitz 2 AV block and has a functioning dual-chamber pacemaker.  She has a history of essential hypertension and history of hypercholesterolemia.  She has sleep apnea and uses a CPAP machine.  She does not have any history of ischemic heart disease.  She had a normal lysis and Cardiolite stress test on 05/24/09.  She had an echocardiogram on 06/04/08 which showed a normal ejection fraction of 65% and no regional wall motion abnormalities and showed normal left ventricular diastolic function is well.  Current Outpatient Prescriptions  Medication Sig Dispense Refill  . amLODipine (NORVASC) 5 MG tablet Take 5 mg by mouth daily.        Marland Kitchen aspirin 81 MG tablet Take 81 mg by mouth daily.        Marland Kitchen atenolol (TENORMIN) 50 MG tablet Take 100 mg by mouth daily.        . Multiple Vitamin (MULTIVITAMIN PO) Take 1 tablet by mouth daily.        . Multiple Vitamins-Minerals (MACULAR VITAMIN BENEFIT) TABS Take by mouth daily.        . naproxen (NAPROSYN) 500 MG tablet Take 500 mg by mouth daily.       . Omega-3 Fatty Acids (FISH OIL) 1200 MG CAPS Take 1 capsule by mouth daily.        Marland Kitchen omeprazole (PRILOSEC) 20 MG capsule Take 20 mg by mouth daily.       . simvastatin (ZOCOR) 40 MG tablet Take 40 mg by mouth at bedtime.        Marland Kitchen telmisartan-hydrochlorothiazide (MICARDIS HCT) 80-12.5 MG per tablet Take 1 tablet by mouth daily.          Allergies  Allergen Reactions  . Amoxicillin     Patient Active Problem List  Diagnoses  . HYPERLIPIDEMIA  . Essential hypertension, benign  . MOBITZ II ATRIOVENTRICULAR BLOCK  . EDEMA  . Osteoarthritis  . Cough,  persistent  . Obstructive sleep apnea  . GERD (gastroesophageal reflux disease)  . Mobitz type 2 second degree AV block  . Benign hypertensive heart disease without heart failure    History  Smoking status  . Never Smoker   Smokeless tobacco  . Not on file    History  Alcohol Use: Not on file    Family History  Problem Relation Age of Onset  . Emphysema Father   . Allergies Father   . Heart disease Father   . Heart disease Mother     Review of Systems: The patient denies any heat or cold intolerance.  No weight gain or weight loss.  The patient denies headaches or blurry vision.  There is no cough or sputum production.  The patient denies dizziness.  There is no hematuria or hematochezia.  The patient denies any muscle aches or arthritis.  The patient denies any rash.  The patient denies frequent falling or instability.  There is no history of depression or anxiety.  All other systems were reviewed and are negative.   Physical Exam: Filed Vitals:   05/19/11 1112  BP: 114/65  Pulse: 71   the general appearance reveals a slightly overweight woman  in no acute distress.  She has lost 14 pounds since her last office visit here.Pupils equal and reactive.   Extraocular Movements are full.  There is no scleral icterus.  The mouth and pharynx are normal.  The neck is supple.  The carotids reveal no bruits.  The jugular venous pressure is normal.  The thyroid is not enlarged.  There is no lymphadenopathy.  The chest is clear to percussion and auscultation. There are no rales or rhonchi. Expansion of the chest is symmetrical.  The precordium is quiet.  The first heart sound is normal.  The second heart sound is physiologically split.  There is no murmur gallop rub or click.  There is no abnormal lift or heave.  The abdomen is soft and nontender. Bowel sounds are normal. The liver and spleen are not enlarged. There Are no abdominal masses. There are no bruits.  The pedal pulses are  good.  There is no phlebitis or edema.  There is no cyanosis or clubbing. Strength is normal and symmetrical in all extremities.  There is no lateralizing weakness.  There are no sensory deficits.  The skin is warm and dry.  There is no rash.  EKG today shows normal sinus rhythm with ventricular pacing which is tracking the atrium normally.      Assessment / Plan: The patient is doing well.  She is cleared from a cardiac standpoint for proposed knee surgery in May.  We will plan to see her back for a followup office visit in about 6 months.  She will stay on same medication and she is encouraged to continue with her weight loss regimen.

## 2011-05-19 NOTE — Assessment & Plan Note (Signed)
The patient has had progressive difficulty with ambulation because of left knee pain.  She has had a previous successful right total knee replacement on 11/17/10.

## 2011-05-19 NOTE — Patient Instructions (Signed)
Your physician recommends that you continue on your current medications as directed. Please refer to the Current Medication list given to you today.  Your physician wants you to follow-up in: 6 months. You will receive a reminder letter in the mail two months in advance. If you don't receive a letter, please call our office to schedule the follow-up appointment.  

## 2011-05-19 NOTE — Assessment & Plan Note (Signed)
The patient has been on simvastatin 40 mg daily.  She is not having any myalgias from simvastatin.  Her lipids are checked by her primary care provider

## 2011-05-26 NOTE — Progress Notes (Signed)
Addended by: Judithe Modest D on: 05/26/2011 12:22 PM   Modules accepted: Orders

## 2011-06-08 ENCOUNTER — Encounter: Payer: Self-pay | Admitting: Internal Medicine

## 2011-06-08 ENCOUNTER — Ambulatory Visit (INDEPENDENT_AMBULATORY_CARE_PROVIDER_SITE_OTHER): Payer: Medicare Other | Admitting: Internal Medicine

## 2011-06-08 DIAGNOSIS — I441 Atrioventricular block, second degree: Secondary | ICD-10-CM

## 2011-06-08 DIAGNOSIS — I10 Essential (primary) hypertension: Secondary | ICD-10-CM

## 2011-06-08 LAB — PACEMAKER DEVICE OBSERVATION
AL IMPEDENCE PM: 408 Ohm
AL THRESHOLD: 0.5 V
ATRIAL PACING PM: 30
RV LEAD AMPLITUDE: 11.2 mv
RV LEAD IMPEDENCE PM: 669 Ohm

## 2011-06-08 NOTE — Assessment & Plan Note (Signed)
Normal pacemaker function See Arita Miss Art report  AV delay extended today to promote intrinsic conduction

## 2011-06-08 NOTE — Patient Instructions (Addendum)
Your physician wants you to follow-up in: 12 months with Dr Jacquiline Doe will receive a reminder letter in the mail two months in advance. If you don't receive a letter, please call our office to schedule the follow-up appointment.   Remote monitoring is used to monitor your Pacemaker of ICD from home. This monitoring reduces the number of office visits required to check your device to one time per year. It allows [place] to keep an eye on the functioning of your device to ensure it is working properly. You are scheduled for a device check from home on 09/10/11. You may send your transmission at any time that day. If you have a wireless device, the transmission will be sent automatically. After your physician reviews your transmission, you will receive a postcard with your next transmission date.

## 2011-06-08 NOTE — Assessment & Plan Note (Signed)
Stable No change required today  Sodium restriction advised Avoid NSIADS as able

## 2011-06-08 NOTE — Progress Notes (Signed)
PCP:  Garlan Fillers, MD, MD Primary Cardiologist:  Dr Patty Sermons  The patient presents today for routine electrophysiology followup.  Since last being seen in our clinic, the patient reports doing very well. Her primary concern is with knee pain.  She has stable edema.  Today, she denies symptoms of palpitations, chest pain, shortness of breath, orthopnea, PND,  dizziness, presyncope, syncope, or neurologic sequela.  The patient feels that she is tolerating medications without difficulties and is otherwise without complaint today.   Past Medical History  Diagnosis Date  . Hypertension   . Abnormal heart rhythm   . Hyperlipidemia   . Chronic headaches   . Sleep apnea     on CPAP-uses [employer]  . Second degree Mobitz II AV block     s/p PPM by Dr Reyes Ivan 2010  . Hypercholesteremia   . Obstructive sleep apnea on CPAP   . Cardiomegaly   . Atypical chest pain   . Osteoarthritis   . Muscle degeneration    Past Surgical History  Procedure Date  . Tubal ligation 1968  . Pacemaker insertion 2010    dual-chamber (MDT) implanted by Dr Reyes Ivan  . Cesarean section   . Cesarean section   . Foot surgery     Current Outpatient Prescriptions  Medication Sig Dispense Refill  . amLODipine (NORVASC) 5 MG tablet Take 5 mg by mouth daily.        Marland Kitchen aspirin 81 MG tablet Take 81 mg by mouth daily.        Marland Kitchen atenolol (TENORMIN) 50 MG tablet Take 100 mg by mouth daily.        . Multiple Vitamin (MULTIVITAMIN PO) Take 1 tablet by mouth daily.        . Multiple Vitamins-Minerals (MACULAR VITAMIN BENEFIT) TABS Take by mouth daily.        . naproxen (NAPROSYN) 500 MG tablet Take 500 mg by mouth 2 (two) times daily with a meal.       . Omega-3 Fatty Acids (FISH OIL) 1200 MG CAPS Take 1 capsule by mouth daily.        Marland Kitchen omeprazole (PRILOSEC) 20 MG capsule Take 20 mg by mouth daily.       . Probiotic Product ([employer] DIGESTIVE PROBIOTIC PO) Take 1 tablet by mouth daily.      . simvastatin (ZOCOR) 40 MG tablet Take  40 mg by mouth at bedtime.        Marland Kitchen telmisartan-hydrochlorothiazide (MICARDIS HCT) 80-12.5 MG per tablet Take 1 tablet by mouth daily.          Allergies  Allergen Reactions  . Amoxicillin     History   Social History  . Marital Status: Married    Spouse Name: Liana Gerold Soden    Number of Children: 3  . Years of Education: N/A   Occupational History  . retired    Social History Main Topics  . Smoking status: Never Smoker   . Smokeless tobacco: Not on file  . Alcohol Use: Not on file  . Drug Use: Not on file  . Sexually Active: Not on file   Other Topics Concern  . Not on file   Social History Narrative  . No narrative on file    Family History  Problem Relation Age of Onset  . Emphysema Father   . Allergies Father   . Heart disease Father   . Heart disease Mother    Physical Exam: Filed Vitals:   06/08/11 1010  BP:  120/66  Pulse: 79  Weight: 208 lb 12.8 oz (94.711 kg)    GEN- The patient is overweight appearing, alert and oriented x 3 today.   Head- normocephalic, atraumatic Eyes-  Sclera clear, conjunctiva pink Ears- hearing intact Oropharynx- clear Neck- supple, no JVP Lymph- no cervical lymphadenopathy Lungs- Clear to ausculation bilaterally, normal work of breathing Chest- pacemaker pocket is well healed Heart- Regular rate and rhythm, no murmurs, rubs or gallops, PMI not laterally displaced GI- soft, NT, ND, + BS Extremities- no clubbing, cyanosis, 1+ edema  Pacemaker interrogation- reviewed in detail today,  See PACEART report  Assessment and Plan:

## 2011-06-15 ENCOUNTER — Telehealth: Payer: Self-pay | Admitting: Internal Medicine

## 2011-06-15 NOTE — Telephone Encounter (Signed)
New Problem   Patient came into office 06/08/11, Adjustments made to pacer on that day.  Patient was told that she should not feel any difference, but has since that time.  She is experiencing flutters lasting about 15 seconds, and she defiantly feels different. Please return call to patient on cell # [phone].  No SOB or dizziness.

## 2011-06-15 NOTE — Telephone Encounter (Signed)
Per pt since changes made on 06-08-11 it is hard to get breath or chest heaviness. Not all time but did not have these feelings before changes. Pt would like to change back to prev settings. Scheduled pt for 06-16-11 @ 845.

## 2011-07-31 ENCOUNTER — Encounter: Payer: Self-pay | Admitting: Gynecology

## 2011-08-11 ENCOUNTER — Ambulatory Visit (INDEPENDENT_AMBULATORY_CARE_PROVIDER_SITE_OTHER): Payer: Medicare Other | Admitting: Obstetrics and Gynecology

## 2011-08-11 ENCOUNTER — Encounter (HOSPITAL_COMMUNITY): Payer: Self-pay | Admitting: Pharmacy Technician

## 2011-08-11 ENCOUNTER — Encounter: Payer: Self-pay | Admitting: Obstetrics and Gynecology

## 2011-08-11 ENCOUNTER — Other Ambulatory Visit: Payer: Self-pay | Admitting: Orthopedic Surgery

## 2011-08-11 VITALS — BP 126/76 | Ht 61.0 in | Wt 208.0 lb

## 2011-08-11 DIAGNOSIS — B373 Candidiasis of vulva and vagina: Secondary | ICD-10-CM

## 2011-08-11 DIAGNOSIS — R3915 Urgency of urination: Secondary | ICD-10-CM

## 2011-08-11 DIAGNOSIS — R32 Unspecified urinary incontinence: Secondary | ICD-10-CM | POA: Insufficient documentation

## 2011-08-11 DIAGNOSIS — N6019 Diffuse cystic mastopathy of unspecified breast: Secondary | ICD-10-CM

## 2011-08-11 DIAGNOSIS — N393 Stress incontinence (female) (male): Secondary | ICD-10-CM

## 2011-08-11 DIAGNOSIS — N952 Postmenopausal atrophic vaginitis: Secondary | ICD-10-CM

## 2011-08-11 DIAGNOSIS — N3941 Urge incontinence: Secondary | ICD-10-CM

## 2011-08-11 MED ORDER — NYSTATIN-TRIAMCINOLONE 100000-0.1 UNIT/GM-% EX OINT: TOPICAL_OINTMENT | Freq: Two times a day (BID) | CUTANEOUS | Status: DC

## 2011-08-11 NOTE — Progress Notes (Signed)
Patient came back to see me today for further follow up. She just had her mammogram and she was told it was normal. She thinks she feels thickness under her right breast. She is not having any pain. We have not received report yet. She is having no vaginal bleeding. She is having no pelvic pain. She has normal bone densities. She does her lab through her PCP. She is experiencing loss of urine both with urgency and with coughing and laughing. It is much more common with urgency. She will occasionally have nocturia but never more than once a night. She does have vaginal dryness but she is doing well without treatment. She gets recurrent vulvar itching due to a yeast and is responding well to Mytrex cream.  ROS: 12 system review done. Pertinent positives above. Other positives include hypertension, hyperlipidemia, atrioventricular block with pacemaker, osteoarthritis, sleep apnea, chronic cough, obstructive sleep apnea, GERD.  Physical examination: Kennon Portela present. HEENT within normal limits. Neck: Thyroid not large. No masses. Supraclavicular nodes: not enlarged. Breasts: Examined in both sitting and lying  position. No skin changes and no masses. The patient does have lumpy breasts but clearly no dominant lesions. Abdomen: Soft no guarding rebound or masses or hernia. Pelvic: External: Within normal limits. BUS: Within normal limits except for her small sebaceous cyst of left labia. Vaginal:within normal limits. poor estrogen effect. No evidence of cystocele rectocele or enterocele. Cervix: clean. Uterus: Normal size and shape. Adnexa: No masses. Rectovaginal exam: Confirmatory and negative. Extremities: Within normal limits.  Assessment: #1. Urgency incontinence #2. Stress incontinence #3. Fibrocystic breasts #4. Atrophic vaginitis #5. Stable sebaceous cyst of left labia #6. Yeast vaginitis.  Plan: Enablex 7.5 mg daily. Samples given. Mytrex cream prescription written. Instructed on Kegel  exercises. Reassured about her breasts assuming mammogram report is normal.

## 2011-08-14 ENCOUNTER — Other Ambulatory Visit: Payer: Self-pay | Admitting: Orthopedic Surgery

## 2011-08-16 ENCOUNTER — Other Ambulatory Visit: Payer: Self-pay | Admitting: Orthopedic Surgery

## 2011-08-16 MED ORDER — BUPIVACAINE 0.25 % ON-Q PUMP SINGLE CATH 300ML: 300.0000 mL | INJECTION | Status: DC

## 2011-08-16 MED ORDER — DEXAMETHASONE SODIUM PHOSPHATE 10 MG/ML IJ SOLN: 10.0000 mg | Freq: Once | INTRAMUSCULAR | Status: DC

## 2011-08-17 ENCOUNTER — Ambulatory Visit (HOSPITAL_COMMUNITY)
Admission: RE | Admit: 2011-08-17 | Discharge: 2011-08-17 | Disposition: A | Discharge status: Home / Self Care | Payer: Medicare Other | Source: Ambulatory Visit | Attending: Orthopedic Surgery | Admitting: Orthopedic Surgery

## 2011-08-17 ENCOUNTER — Encounter (HOSPITAL_COMMUNITY): Payer: Self-pay

## 2011-08-17 ENCOUNTER — Encounter (HOSPITAL_COMMUNITY)
Admission: RE | Admit: 2011-08-17 | Discharge: 2011-08-17 | Disposition: A | Discharge status: Home / Self Care | Payer: Medicare Other | Source: Ambulatory Visit | Attending: Orthopedic Surgery | Admitting: Orthopedic Surgery

## 2011-08-17 DIAGNOSIS — Z01812 Encounter for preprocedural laboratory examination: Secondary | ICD-10-CM | POA: Insufficient documentation

## 2011-08-17 DIAGNOSIS — Z01818 Encounter for other preprocedural examination: Secondary | ICD-10-CM | POA: Insufficient documentation

## 2011-08-17 HISTORY — DX: Acute upper respiratory infection, unspecified: J06.9

## 2011-08-17 HISTORY — DX: Headache: R51

## 2011-08-17 HISTORY — DX: Gastro-esophageal reflux disease without esophagitis: K21.9

## 2011-08-17 HISTORY — DX: Cardiac arrhythmia, unspecified: I49.9

## 2011-08-17 LAB — APTT: aPTT: 32 seconds (ref 24–37)

## 2011-08-17 LAB — CBC
HCT: 38.5 % (ref 36.0–46.0)
Hemoglobin: 12.9 g/dL (ref 12.0–15.0)
MCH: 29.1 pg (ref 26.0–34.0)
MCHC: 33.5 g/dL (ref 30.0–36.0)

## 2011-08-17 LAB — COMPREHENSIVE METABOLIC PANEL
Alkaline Phosphatase: 27 U/L — ABNORMAL LOW (ref 39–117)
BUN: 23 mg/dL (ref 6–23)
Calcium: 9.8 mg/dL (ref 8.4–10.5)
GFR calc Af Amer: 64 mL/min — ABNORMAL LOW (ref >90)
Glucose, Bld: 85 mg/dL (ref 70–99)
Potassium: 4.1 mEq/L (ref 3.5–5.1)
Total Protein: 7.7 g/dL (ref 6.0–8.3)

## 2011-08-17 LAB — PROTIME-INR: INR: 1.04 (ref 0.00–1.49)

## 2011-08-17 LAB — URINALYSIS, ROUTINE W REFLEX MICROSCOPIC
Bilirubin Urine: NEGATIVE
Nitrite: NEGATIVE
Protein, ur: NEGATIVE mg/dL
Specific Gravity, Urine: 1.01 (ref 1.005–1.030)
Urobilinogen, UA: 0.2 mg/dL (ref 0.0–1.0)

## 2011-08-17 LAB — SURGICAL PCR SCREEN: MRSA, PCR: NEGATIVE

## 2011-08-17 NOTE — Pre-Procedure Instructions (Signed)
PT HAS EKG AND CARDIOLOGY OFFICE NOTE FROM DR. BRACKBILL FROM 05/19/11 AND NOTE OF CARDIAC CLEARANCE FOR HER LEFT KNEE REPLACEMENT SURGERY.  REPORTS IN EPIC AND ON PT'S CHART. PT HAS PACEMAKER--AND PERIOPERATIVE PRESCRIPTION FOR IMPLANTED CARDIAC DEVICE PROGRAMMING ON PT'S CHART SIGNED BY DR. Hillis Range AND DR. ALLRED'S LAST OFFICE NOTE 06/08/11 IN EPIC AND ON PT'S CHART. CBC, CMET, PT, PTT, UA, CXR WERE DONE TODAY - PREOP AT [facility].  T/S TO BE DONE DAY OF SURGERY.

## 2011-08-17 NOTE — Patient Instructions (Signed)
YOUR SURGERY IS SCHEDULED ON:  Monday 5/6  AT 8:30 AM  REPORT TO [place] [facility]AY [facility] AT: 6:00 AM      PHONE # FOR SHORT STAY IS [phone]  DO NOT EAT OR DRINK ANYTHING AFTER MIDNIGHT THE NIGHT BEFORE YOUR SURGERY.  YOU MAY BRUSH YOUR TEETH, RINSE OUT YOUR MOUTH--BUT NO WATER, NO FOOD, NO CHEWING GUM, NO MINTS, NO CANDIES, NO CHEWING TOBACCO.  PLEASE TAKE THE FOLLOWING MEDICATIONS THE AM OF YOUR SURGERY WITH A FEW SIPS OF WATER:  ATENOLOL AND OMEPRAZOLE    IF YOU USE INHALERS--USE YOUR INHALERS THE AM OF YOUR SURGERY AND BRING INHALERS TO THE HOSPITAL -TAKE TO SURGERY.    IF YOU ARE DIABETIC:  DO NOT TAKE ANY DIABETIC MEDICATIONS THE AM OF YOUR SURGERY.  IF YOU TAKE INSULIN IN THE EVENINGS--PLEASE ONLY TAKE 1/2 NORMAL EVENING DOSE THE NIGHT BEFORE YOUR SURGERY.  NO INSULIN THE AM OF YOUR SURGERY.  IF YOU HAVE SLEEP APNEA AND USE CPAP OR BIPAP--PLEASE BRING THE MASK --NOT THE MACHINE-NOT THE TUBING   -JUST THE MASK. DO NOT BRING VALUABLES, MONEY, CREDIT CARDS.  CONTACT LENS, DENTURES / PARTIALS, GLASSES SHOULD NOT BE WORN TO SURGERY AND IN MOST CASES-HEARING AIDS WILL NEED TO BE REMOVED.  BRING YOUR GLASSES CASE, ANY EQUIPMENT NEEDED FOR YOUR CONTACT LENS. FOR PATIENTS ADMITTED TO THE HOSPITAL--CHECK OUT TIME THE DAY OF DISCHARGE IS 11:00 AM.  ALL INPATIENT ROOMS ARE PRIVATE - WITH BATHROOM, TELEPHONE, TELEVISION AND WIFI INTERNET. IF YOU ARE BEING DISCHARGED THE SAME DAY OF YOUR SURGERY--YOU CAN NOT DRIVE YOURSELF HOME--AND SHOULD NOT GO HOME ALONE BY TAXI OR BUS.  NO DRIVING OR OPERATING MACHINERY FOR 24 HOURS FOLLOWING ANESTHESIA / PAIN MEDICATIONS.                            SPECIAL INSTRUCTIONS:  CHLORHEXIDINE SOAP SHOWER (other brand names are Betasept and Hibiclens ) PLEASE SHOWER WITH CHLORHEXIDINE THE NIGHT BEFORE YOUR SURGERY AND THE AM OF YOUR SURGERY. DO NOT USE CHLORHEXIDINE ON YOUR FACE OR PRIVATE AREAS--YOU MAY USE YOUR NORMAL SOAP THOSE AREAS AND YOUR NORMAL SHAMPOO.   WOMEN SHOULD AVOID SHAVING UNDER ARMS AND SHAVING LEGS 48 HOURS BEFORE USING CHLORHEXIDINE TO AVOID SKIN IRRITATION.  DO NOT USE IF ALLERGIC TO CHLORHEXIDINE.  PLEASE READ OVER ANY  FACT SHEETS THAT YOU WERE GIVEN: MRSA INFORMATION, BLOOD TRANSFUSION INFORMATION, INCENTIVE SPIROMETER INFORMATION.

## 2011-08-23 ENCOUNTER — Other Ambulatory Visit: Payer: Self-pay | Admitting: Orthopedic Surgery

## 2011-08-23 NOTE — Anesthesia Preprocedure Evaluation (Addendum)
Anesthesia Evaluation  Patient identified by MRN, date of birth, ID band Patient awake    Reviewed: Allergy & Precautions, H&P , NPO status , Patient's Chart, lab work & pertinent test results, reviewed documented beta blocker date and time   Airway Mallampati: II TM Distance: >3 FB Neck ROM: full    Dental No notable dental hx. (+) Teeth Intact and Dental Advisory Given   Pulmonary neg pulmonary ROS, sleep apnea and Continuous Positive Airway Pressure Ventilation , Recent URI , Resolved,  breath sounds clear to auscultation  Pulmonary exam normal       Cardiovascular Exercise Tolerance: Good hypertension, Pt. on medications and Pt. on home beta blockers negative cardio ROS  + dysrhythmias + pacemaker Rhythm:regular Rate:Normal  Mobitz 2 AV block   Neuro/Psych  Headaches, negative neurological ROS  negative psych ROS   GI/Hepatic negative GI ROS, Neg liver ROS, GERD-  Medicated and Controlled,  Endo/Other  negative endocrine ROS  Renal/GU negative Renal ROS  negative genitourinary   Musculoskeletal   Abdominal   Peds  Hematology negative hematology ROS (+)   Anesthesia Other Findings   Reproductive/Obstetrics negative OB ROS                          Anesthesia Physical Anesthesia Plan  ASA: III  Anesthesia Plan: General   Post-op Pain Management:    Induction: Intravenous  Airway Management Planned: Oral ETT  Additional Equipment:   Intra-op Plan:   Post-operative Plan: Extubation in OR  Informed Consent: I have reviewed the patients History and Physical, chart, labs and discussed the procedure including the risks, benefits and alternatives for the proposed anesthesia with the patient or authorized representative who has indicated his/her understanding and acceptance.   Dental Advisory Given  Plan Discussed with: CRNA and Surgeon  Anesthesia Plan Comments:         Anesthesia Quick Evaluation

## 2011-08-23 NOTE — H&P (Signed)
[name]  DOB: 03/07/1935 Married / Language: English / Race: White Female  Date of Admission:  08/24/2011  Chief Complaint:  Left Knee Pain  History of Present Illness The patient is a 77 year old female who comes in for a preoperative History and Physical. The patient is scheduled for a left total knee arthroplasty to be performed by Dr. Frank V. Aluisio, MD at [facility] on 08/24/2011. The patient is a 76 year old female who is postop for her right total knee arthroplasty. Overall the patient feels that the right knee is doing well. Post operative pain has been mild. The patient does report pain The patient does indicate that these symptoms are gradually improving. No pain medications are being taken. The patient is utilzing a home exercise program. [name] states that the right knee is doing well. The left knee is causing a lot of problems and is progressively getting worse. She is now at a stage where she wants to consider intervention for the left knee. They have been treated conservatively in the past for the above stated problem and despite conservative measures, they continue to have progressive pain and severe functional limitations and dysfunction. They have failed non-operative management. It is felt that they would benefit from undergoing total joint replacement. Risks and benefits of the procedure have been discussed with the patient and they elect to proceed with surgery. There are no active contraindications to surgery such as ongoing infection or rapidly progressive neurological disease.  Allergies NKDA  Intolerances CODEINE. 04/30/1999 Nausea. intolerance PENICILLIN. 04/30/1999 Diarrhea, Nausea. Amoxicillin *PENICILLINS*. Diarrhea, Nausea.  Medication History AmLODIPine Besylate (5MG Tablet, Oral daily) Active. Aspirin Child (81MG Tablet Chewable, Oral at bedtime) Active. Atenolol (50MG Tablet, Oral two times daily) Active. Naproxen (500MG  Tablet, Oral daily) Active. Omega 3 (1200MG Capsule, Oral) Active. Omeprazole (20MG Capsule DR, Oral daily) Active. Simvastatin (40MG Tablet, Oral at bedtime) Active. Micardis HCT (80-12.5MG Tablet, Oral daily) Active.  Problem List/Past Medical Trigger finger (727.03). 10/17/2010 Gastroesophageal Reflux Disease High blood pressure Hypercholesterolemia Migraine Headache Osteoarthritis Other disease, cancer, significant illness Sleep Apnea. uses CPAP Migraine Headache Impaired Vision. wears glasses Impaired Hearing. hearing aids Macular Degeneration. Left eye Cataract. Right eye Pacemaker. Due to bradycardia - History of Mobitz Type II Atrioventricular block Mobitz Type II Incomplete Atrioventricular Block Hiatal Hernia Hemorrhoids  Family History Cancer. mother Congestive Heart Failure. mother Diabetes Mellitus. mother Drug / Alcohol Addiction. father Heart Disease. father and grandfather fathers side Heart disease in female family member before age 55 Hypertension. mother and father Osteoarthritis. mother and sister Father. Deceased, Emphysema. age 77 Mother. Deceased, Breast cancer. age 94  Social History Alcohol use. never consumed alcohol Children. 3 Current work status. retired Drug/Alcohol Rehab (Currently). no Drug/Alcohol Rehab (Previously). no Exercise. Exercises weekly; does other Illicit drug use. no Living situation. live with spouse Marital status. married Number of flights of stairs before winded. 1 Pain Contract. no Tobacco / smoke exposure. no Tobacco use. Never smoker. never smoker Post-Surgical Plans. Plan is to go home after the hospital stay. Advance Directives. Living Will, Healthcare POA  Past Surgical History Arthroscopy of Knee. left Carpal Tunnel Repair. Date: 2011. bilateral Cataract Surgery. left Cesarean Delivery. 2 times Foot Surgery. left Tonsillectomy Total Knee Replacement. Date:  11/17/2010. right Tubal Ligation Dilation and Curettage of Uterus - Multiple. 1961, 1968  Review of Systems General:Not Present- Chills, Fever, Night Sweats, Appetite Loss, Fatigue, Feeling sick, Weight Gain and Weight Loss. Skin:Not Present- Itching, Rash, Skin Color   Changes, Ulcer, Psoriasis and Change in Hair or Nails. HEENT:Present- Hearing Loss and Decreased Hearing. Not Present- Sensitivity to light, Hearing problems, Nose Bleed and Ringing in the Ears. Neck:Not Present- Swollen Glands and Neck Mass. Respiratory:Not Present- Snoring, Chronic Cough, Bloody sputum and Dyspnea. Cardiovascular:Not Present- Shortness of Breath, Chest Pain, Swelling of Extremities, Leg Cramps and Palpitations. Gastrointestinal:Not Present- Bloody Stool, Heartburn, Abdominal Pain, Vomiting, Nausea and Incontinence of Stool. Female Genitourinary:Present- Incontinence. Not Present- Blood in Urine, Menstrual Irregularities, Frequency and Nocturia. Musculoskeletal:Present- Joint Pain. Not Present- Muscle Weakness, Muscle Pain, Joint Stiffness, Joint Swelling and Back Pain. Neurological:Not Present- Tingling, Numbness, Burning, Tremor, Headaches and Dizziness. Psychiatric:Not Present- Anxiety, Depression and Memory Loss. Endocrine:Not Present- Cold Intolerance, Heat Intolerance, Excessive hunger and Excessive Thirst. Hematology:Not Present- Abnormal Bleeding, Anemia, Blood Clots and Easy Bruising.  Vitals Weight: 200 lb Height: 61 in Body Surface Area: 1.98 m Body Mass Index: 37.79 kg/m Pulse: 74 (Regular) Resp.: 12 (Unlabored) BP: 134/78 (Sitting, Left Arm, Standard)  Physical Exam The physical exam findings are as follows: Patient is a 77 year old female with continued knee pain.   General Mental Status - Alert, cooperative and good historian. General Appearance- pleasant. Not in acute distress. Orientation- Oriented X3. Build & Nutrition- Well nourished and Well  developed.   Head and Neck Head- normocephalic, atraumatic . Neck Global Assessment- supple. no bruit auscultated on the right and no bruit auscultated on the left.   Eye Pupil- Bilateral- Regular and Round. Motion- Bilateral- EOMI.   Chest and Lung Exam Auscultation: Breath sounds:- clear at anterior chest wall and - clear at posterior chest wall. Adventitious sounds:- No Adventitious sounds.   Cardiovascular Auscultation:Rhythm- Regular rate and rhythm. Heart Sounds- S1 WNL and S2 WNL. Murmurs & Other Heart Sounds:Auscultation of the heart reveals - No Murmurs.   Abdomen Inspection:Contour- Generalized mild distention. Palpation/Percussion:Tenderness- Abdomen is non-tender to palpation. Rigidity (guarding)- Abdomen is soft. Auscultation:Auscultation of the abdomen reveals - Bowel sounds normal.   Female Genitourinary Not done, not pertinent to present illness  Musculoskeletal On exam she is in no distress. The right knee looks fine. There is no swelling. Range of motion about 3 to 122 degrees. There is no instability noted. Her left knee shows slight varus, range 5 to 120. Moderate crepitus on range of motion with no instability.  RADIOGRAPHS: X rays - AP and lateral look excellent. Right knee prosthesis shows no abnormalities. She does have arthritis in the left knee.  EKG (not done in this office) - per Dr. Brackbill's office note, the EKG shows normal sinus rhythm with ventricular pacing which is tracking the atrium normally.  Assessment & Plan Osteoarthritis Left Knee  Patient is for a Left Total Knee Replacement by Dr. Aluisio.  Plan is to go home.  PCP - Dr. Daniel Paterson - Patient has been seen by Dr. Paterson and felt to be stable for surgery. Dr. Paterson would like to be notified FYI when the patient comes in for surgery.  Cards - Dr. Tom Brackbill - Patient has been seen by Dr. Brackbill preoperatively and flet to be  stable from a cardiac standpoint for the proposed knee surgery. He will see her back 6 months in follow up. He recommended her staying on the same medications at this time.  Drew Romilda Proby, PA-C  

## 2011-08-24 ENCOUNTER — Inpatient Hospital Stay (HOSPITAL_COMMUNITY)
Admission: RE | Admit: 2011-08-24 | Discharge: 2011-08-27 | DRG: 470 | Disposition: A | Discharge status: Home Health | Payer: Medicare Other | Source: Ambulatory Visit | Attending: Orthopedic Surgery | Admitting: Orthopedic Surgery

## 2011-08-24 ENCOUNTER — Ambulatory Visit (HOSPITAL_COMMUNITY): Payer: Medicare Other | Admitting: Anesthesiology

## 2011-08-24 ENCOUNTER — Other Ambulatory Visit: Payer: Self-pay | Admitting: Orthopedic Surgery

## 2011-08-24 ENCOUNTER — Encounter (HOSPITAL_COMMUNITY): Payer: Self-pay | Admitting: Anesthesiology

## 2011-08-24 ENCOUNTER — Encounter (HOSPITAL_COMMUNITY): Payer: Self-pay | Admitting: *Deleted

## 2011-08-24 ENCOUNTER — Encounter (HOSPITAL_COMMUNITY)
Admission: RE | Disposition: A | Discharge status: Home Health | Payer: Self-pay | Source: Ambulatory Visit | Attending: Orthopedic Surgery

## 2011-08-24 DIAGNOSIS — M179 Osteoarthritis of knee, unspecified: Secondary | ICD-10-CM | POA: Diagnosis present

## 2011-08-24 DIAGNOSIS — Z96659 Presence of unspecified artificial knee joint: Secondary | ICD-10-CM

## 2011-08-24 DIAGNOSIS — I10 Essential (primary) hypertension: Secondary | ICD-10-CM | POA: Diagnosis present

## 2011-08-24 DIAGNOSIS — D62 Acute posthemorrhagic anemia: Secondary | ICD-10-CM | POA: Diagnosis not present

## 2011-08-24 DIAGNOSIS — M1710 Unilateral primary osteoarthritis, unspecified knee: Secondary | ICD-10-CM | POA: Diagnosis present

## 2011-08-24 DIAGNOSIS — Z950 Presence of cardiac pacemaker: Secondary | ICD-10-CM

## 2011-08-24 DIAGNOSIS — K219 Gastro-esophageal reflux disease without esophagitis: Secondary | ICD-10-CM | POA: Diagnosis present

## 2011-08-24 DIAGNOSIS — G4733 Obstructive sleep apnea (adult) (pediatric): Secondary | ICD-10-CM | POA: Diagnosis present

## 2011-08-24 HISTORY — PX: TOTAL KNEE ARTHROPLASTY: SHX125

## 2011-08-24 LAB — TYPE AND SCREEN
ABO/RH(D): A NEG
Antibody Screen: NEGATIVE

## 2011-08-24 SURGERY — ARTHROPLASTY, KNEE, TOTAL
Anesthesia: General | Site: Knee | Laterality: Left | Wound class: Clean

## 2011-08-24 MED ORDER — TEMAZEPAM 15 MG PO CAPS: 15.0000 mg | ORAL_CAPSULE | Freq: Every evening | ORAL | Status: DC | PRN

## 2011-08-24 MED ORDER — METOCLOPRAMIDE HCL 10 MG PO TABS: 5.0000 mg | ORAL_TABLET | Freq: Three times a day (TID) | ORAL | Status: DC | PRN

## 2011-08-24 MED ORDER — HYDROMORPHONE HCL PF 1 MG/ML IJ SOLN
INTRAMUSCULAR | Status: DC | PRN
  Administered 2011-08-24 (×2): 1 mg via INTRAVENOUS

## 2011-08-24 MED ORDER — DIPHENHYDRAMINE HCL 12.5 MG/5ML PO ELIX: 12.5000 mg | ORAL_SOLUTION | ORAL | Status: DC | PRN

## 2011-08-24 MED ORDER — ATORVASTATIN CALCIUM 20 MG PO TABS
20.0000 mg | ORAL_TABLET | Freq: Every day | ORAL | Status: DC
  Administered 2011-08-25 – 2011-08-26 (×2): 20 mg via ORAL
  Filled 2011-08-24 (×4): qty 1

## 2011-08-24 MED ORDER — TIOTROPIUM BROMIDE MONOHYDRATE 18 MCG IN CAPS
18.0000 ug | ORAL_CAPSULE | Freq: Every day | RESPIRATORY_TRACT | Status: DC | PRN
  Filled 2011-08-24: qty 5

## 2011-08-24 MED ORDER — METOCLOPRAMIDE HCL 5 MG/ML IJ SOLN: 5.0000 mg | Freq: Three times a day (TID) | INTRAMUSCULAR | Status: DC | PRN

## 2011-08-24 MED ORDER — CEFAZOLIN SODIUM 1-5 GM-% IV SOLN
1.0000 g | Freq: Four times a day (QID) | INTRAVENOUS | Status: AC
  Administered 2011-08-24 – 2011-08-25 (×3): 1 g via INTRAVENOUS
  Filled 2011-08-24 (×4): qty 50

## 2011-08-24 MED ORDER — CEFAZOLIN SODIUM-DEXTROSE 2-3 GM-% IV SOLR
INTRAVENOUS | Status: AC
  Filled 2011-08-24: qty 50

## 2011-08-24 MED ORDER — BUPIVACAINE ON-Q PAIN PUMP (FOR ORDER SET NO CHG)
INJECTION | Status: DC
  Filled 2011-08-24: qty 1

## 2011-08-24 MED ORDER — ACETAMINOPHEN 10 MG/ML IV SOLN
1000.0000 mg | Freq: Once | INTRAVENOUS | Status: AC
  Administered 2011-08-24: 1000 mg via INTRAVENOUS

## 2011-08-24 MED ORDER — CEFAZOLIN SODIUM-DEXTROSE 2-3 GM-% IV SOLR
2.0000 g | INTRAVENOUS | Status: AC
  Administered 2011-08-24: 2 g via INTRAVENOUS

## 2011-08-24 MED ORDER — RIVAROXABAN 10 MG PO TABS
10.0000 mg | ORAL_TABLET | Freq: Every day | ORAL | Status: DC
  Administered 2011-08-25 – 2011-08-27 (×3): 10 mg via ORAL
  Filled 2011-08-24 (×4): qty 1

## 2011-08-24 MED ORDER — PANTOPRAZOLE SODIUM 40 MG PO TBEC
40.0000 mg | DELAYED_RELEASE_TABLET | Freq: Every day | ORAL | Status: DC
  Filled 2011-08-24 (×2): qty 1

## 2011-08-24 MED ORDER — FLUTICASONE PROPIONATE 50 MCG/ACT NA SUSP
1.0000 | Freq: Every day | NASAL | Status: DC | PRN
  Filled 2011-08-24: qty 16

## 2011-08-24 MED ORDER — FENTANYL CITRATE 0.05 MG/ML IJ SOLN
INTRAMUSCULAR | Status: DC | PRN
  Administered 2011-08-24: 100 ug via INTRAVENOUS
  Administered 2011-08-24: 50 ug via INTRAVENOUS
  Administered 2011-08-24: 100 ug via INTRAVENOUS
  Administered 2011-08-24 (×2): 50 ug via INTRAVENOUS

## 2011-08-24 MED ORDER — PROPOFOL 10 MG/ML IV EMUL
INTRAVENOUS | Status: DC | PRN
  Administered 2011-08-24: 120 mg via INTRAVENOUS

## 2011-08-24 MED ORDER — PHENOL 1.4 % MT LIQD: 1.0000 | OROMUCOSAL | Status: DC | PRN

## 2011-08-24 MED ORDER — NEOSTIGMINE METHYLSULFATE 1 MG/ML IJ SOLN
INTRAMUSCULAR | Status: DC | PRN
  Administered 2011-08-24: 4 mg via INTRAVENOUS

## 2011-08-24 MED ORDER — METHOCARBAMOL 100 MG/ML IJ SOLN
500.0000 mg | Freq: Four times a day (QID) | INTRAVENOUS | Status: DC | PRN
  Administered 2011-08-24: 500 mg via INTRAVENOUS
  Filled 2011-08-24: qty 5

## 2011-08-24 MED ORDER — BUPIVACAINE 0.25 % ON-Q PUMP SINGLE CATH 300ML
300.0000 mL | INJECTION | Status: DC
  Filled 2011-08-24: qty 300

## 2011-08-24 MED ORDER — HYDROMORPHONE HCL PF 1 MG/ML IJ SOLN: 0.2500 mg | INTRAMUSCULAR | Status: DC | PRN

## 2011-08-24 MED ORDER — ONDANSETRON HCL 4 MG/2ML IJ SOLN
INTRAMUSCULAR | Status: DC | PRN
  Administered 2011-08-24: 4 mg via INTRAVENOUS

## 2011-08-24 MED ORDER — LIDOCAINE HCL (CARDIAC) 20 MG/ML IV SOLN
INTRAVENOUS | Status: DC | PRN
  Administered 2011-08-24: 60 mg via INTRAVENOUS

## 2011-08-24 MED ORDER — LACTATED RINGERS IV SOLN
INTRAVENOUS | Status: DC | PRN
  Administered 2011-08-24 (×2): via INTRAVENOUS

## 2011-08-24 MED ORDER — ATENOLOL 50 MG PO TABS
50.0000 mg | ORAL_TABLET | Freq: Two times a day (BID) | ORAL | Status: DC
  Administered 2011-08-24 – 2011-08-27 (×6): 50 mg via ORAL
  Filled 2011-08-24 (×7): qty 1

## 2011-08-24 MED ORDER — ROCURONIUM BROMIDE 100 MG/10ML IV SOLN
INTRAVENOUS | Status: DC | PRN
  Administered 2011-08-24: 40 mg via INTRAVENOUS

## 2011-08-24 MED ORDER — FLEET ENEMA 7-19 GM/118ML RE ENEM: 1.0000 | ENEMA | Freq: Once | RECTAL | Status: AC | PRN

## 2011-08-24 MED ORDER — GLYCOPYRROLATE 0.2 MG/ML IJ SOLN
INTRAMUSCULAR | Status: DC | PRN
  Administered 2011-08-24: 0.6 mg via INTRAVENOUS

## 2011-08-24 MED ORDER — ACETAMINOPHEN 10 MG/ML IV SOLN
INTRAVENOUS | Status: AC
  Filled 2011-08-24: qty 100

## 2011-08-24 MED ORDER — HYDROMORPHONE HCL 2 MG PO TABS
2.0000 mg | ORAL_TABLET | ORAL | Status: DC | PRN
  Administered 2011-08-25 (×2): 2 mg via ORAL
  Administered 2011-08-25 – 2011-08-27 (×3): 4 mg via ORAL
  Administered 2011-08-27: 2 mg via ORAL
  Filled 2011-08-24: qty 1
  Filled 2011-08-24: qty 2
  Filled 2011-08-24: qty 1
  Filled 2011-08-24: qty 2
  Filled 2011-08-24: qty 1
  Filled 2011-08-24: qty 2

## 2011-08-24 MED ORDER — DEXAMETHASONE SODIUM PHOSPHATE 10 MG/ML IJ SOLN
INTRAMUSCULAR | Status: DC | PRN
  Administered 2011-08-24: 10 mg via INTRAVENOUS

## 2011-08-24 MED ORDER — TELMISARTAN-HCTZ 80-12.5 MG PO TABS: 1.0000 | ORAL_TABLET | Freq: Every day | ORAL | Status: DC

## 2011-08-24 MED ORDER — POLYETHYLENE GLYCOL 3350 17 G PO PACK: 17.0000 g | PACK | Freq: Every day | ORAL | Status: DC | PRN

## 2011-08-24 MED ORDER — HYDROCHLOROTHIAZIDE 12.5 MG PO CAPS
12.5000 mg | ORAL_CAPSULE | Freq: Every day | ORAL | Status: DC
  Administered 2011-08-25 – 2011-08-26 (×2): 12.5 mg via ORAL
  Filled 2011-08-24 (×3): qty 1

## 2011-08-24 MED ORDER — TRAMADOL HCL 50 MG PO TABS
50.0000 mg | ORAL_TABLET | Freq: Four times a day (QID) | ORAL | Status: DC | PRN
  Filled 2011-08-24: qty 2

## 2011-08-24 MED ORDER — ACETAMINOPHEN 650 MG RE SUPP: 650.0000 mg | Freq: Four times a day (QID) | RECTAL | Status: DC | PRN

## 2011-08-24 MED ORDER — DOCUSATE SODIUM 100 MG PO CAPS
100.0000 mg | ORAL_CAPSULE | Freq: Two times a day (BID) | ORAL | Status: DC
  Administered 2011-08-24 – 2011-08-27 (×6): 100 mg via ORAL

## 2011-08-24 MED ORDER — NYSTATIN-TRIAMCINOLONE 100000-0.1 UNIT/GM-% EX OINT
1.0000 "application " | TOPICAL_OINTMENT | Freq: Two times a day (BID) | CUTANEOUS | Status: DC | PRN
  Filled 2011-08-24: qty 15

## 2011-08-24 MED ORDER — SODIUM CHLORIDE 0.9 % IV SOLN: INTRAVENOUS | Status: DC

## 2011-08-24 MED ORDER — SIMVASTATIN 40 MG PO TABS
40.0000 mg | ORAL_TABLET | Freq: Every day | ORAL | Status: DC
  Filled 2011-08-24: qty 1

## 2011-08-24 MED ORDER — HYDROMORPHONE HCL PF 1 MG/ML IJ SOLN: 0.5000 mg | INTRAMUSCULAR | Status: DC | PRN

## 2011-08-24 MED ORDER — LACTATED RINGERS IV SOLN
INTRAVENOUS | Status: DC
  Administered 2011-08-24: 10:00:00 via INTRAVENOUS

## 2011-08-24 MED ORDER — IRBESARTAN 300 MG PO TABS
300.0000 mg | ORAL_TABLET | Freq: Every day | ORAL | Status: DC
  Administered 2011-08-25: 300 mg via ORAL
  Filled 2011-08-24 (×4): qty 1

## 2011-08-24 MED ORDER — ACETAMINOPHEN 10 MG/ML IV SOLN
1000.0000 mg | Freq: Four times a day (QID) | INTRAVENOUS | Status: AC
  Administered 2011-08-24 – 2011-08-25 (×4): 1000 mg via INTRAVENOUS
  Filled 2011-08-24 (×6): qty 100

## 2011-08-24 MED ORDER — ONDANSETRON HCL 4 MG PO TABS: 4.0000 mg | ORAL_TABLET | Freq: Four times a day (QID) | ORAL | Status: DC | PRN

## 2011-08-24 MED ORDER — ONDANSETRON HCL 4 MG/2ML IJ SOLN
4.0000 mg | Freq: Four times a day (QID) | INTRAMUSCULAR | Status: DC | PRN
  Administered 2011-08-26: 4 mg via INTRAVENOUS
  Filled 2011-08-24: qty 2

## 2011-08-24 MED ORDER — METHOCARBAMOL 500 MG PO TABS
500.0000 mg | ORAL_TABLET | Freq: Four times a day (QID) | ORAL | Status: DC | PRN
  Administered 2011-08-24 – 2011-08-27 (×4): 500 mg via ORAL
  Filled 2011-08-24 (×4): qty 1

## 2011-08-24 MED ORDER — BISACODYL 10 MG RE SUPP: 10.0000 mg | Freq: Every day | RECTAL | Status: DC | PRN

## 2011-08-24 MED ORDER — SODIUM CHLORIDE 0.9 % IR SOLN
Status: DC | PRN
  Administered 2011-08-24: 3000 mL

## 2011-08-24 MED ORDER — MENTHOL 3 MG MT LOZG: 1.0000 | LOZENGE | OROMUCOSAL | Status: DC | PRN

## 2011-08-24 MED ORDER — AMLODIPINE BESYLATE 5 MG PO TABS
5.0000 mg | ORAL_TABLET | Freq: Every day | ORAL | Status: DC
  Administered 2011-08-24 – 2011-08-26 (×3): 5 mg via ORAL
  Filled 2011-08-24 (×4): qty 1

## 2011-08-24 MED ORDER — ACETAMINOPHEN 325 MG PO TABS: 650.0000 mg | ORAL_TABLET | Freq: Four times a day (QID) | ORAL | Status: DC | PRN

## 2011-08-24 MED ORDER — DEXTROSE-NACL 5-0.9 % IV SOLN
INTRAVENOUS | Status: DC
  Administered 2011-08-24: 18:00:00 via INTRAVENOUS

## 2011-08-24 SURGICAL SUPPLY — 55 items
BAG SPEC THK2 15X12 ZIP CLS (MISCELLANEOUS) ×1
BAG ZIPLOCK 12X15 (MISCELLANEOUS) ×2 IMPLANT
BANDAGE ELASTIC 6 VELCRO ST LF (GAUZE/BANDAGES/DRESSINGS) ×2 IMPLANT
BANDAGE ESMARK 6X9 LF (GAUZE/BANDAGES/DRESSINGS) ×1 IMPLANT
BLADE SAG 18X100X1.27 (BLADE) ×2 IMPLANT
BLADE SAW SGTL 11.0X1.19X90.0M (BLADE) ×2 IMPLANT
BNDG CMPR 9X6 STRL LF SNTH (GAUZE/BANDAGES/DRESSINGS) ×1
BNDG ESMARK 6X9 LF (GAUZE/BANDAGES/DRESSINGS) ×2
BOWL SMART MIX CTS (DISPOSABLE) ×2 IMPLANT
CATH KIT ON-Q SILVERSOAK 5 (CATHETERS) ×1 IMPLANT
CATH KIT ON-Q SILVERSOAK 5IN (CATHETERS) ×2 IMPLANT
CEMENT HV SMART SET (Cement) ×4 IMPLANT
CLOTH BEACON ORANGE TIMEOUT ST (SAFETY) ×2 IMPLANT
CLSR STERI-STRIP ANTIMIC 1/2X4 (GAUZE/BANDAGES/DRESSINGS) ×1 IMPLANT
CUFF TOURN SGL QUICK 34 (TOURNIQUET CUFF) ×2
CUFF TRNQT CYL 34X4X40X1 (TOURNIQUET CUFF) ×1 IMPLANT
DRAPE EXTREMITY T 121X128X90 (DRAPE) ×2 IMPLANT
DRAPE POUCH INSTRU U-SHP 10X18 (DRAPES) ×2 IMPLANT
DRAPE U-SHAPE 47X51 STRL (DRAPES) ×2 IMPLANT
DRSG ADAPTIC 3X8 NADH LF (GAUZE/BANDAGES/DRESSINGS) ×2 IMPLANT
DRSG EMULSION OIL 3X16 NADH (GAUZE/BANDAGES/DRESSINGS) ×1 IMPLANT
DURAPREP 26ML APPLICATOR (WOUND CARE) ×2 IMPLANT
ELECT REM PT RETURN 9FT ADLT (ELECTROSURGICAL) ×2
ELECTRODE REM PT RTRN 9FT ADLT (ELECTROSURGICAL) ×1 IMPLANT
EVACUATOR 1/8 PVC DRAIN (DRAIN) ×2 IMPLANT
FACESHIELD LNG OPTICON STERILE (SAFETY) ×10 IMPLANT
GLOVE BIO SURGEON STRL SZ7.5 (GLOVE) ×2 IMPLANT
GLOVE BIO SURGEON STRL SZ8 (GLOVE) ×2 IMPLANT
GLOVE BIOGEL PI IND STRL 8 (GLOVE) ×2 IMPLANT
GLOVE BIOGEL PI INDICATOR 8 (GLOVE) ×2
GOWN STRL NON-REIN LRG LVL3 (GOWN DISPOSABLE) ×2 IMPLANT
GOWN STRL REIN XL XLG (GOWN DISPOSABLE) ×2 IMPLANT
HANDPIECE INTERPULSE COAX TIP (DISPOSABLE) ×2
IMMOBILIZER KNEE 20 (SOFTGOODS) ×2
IMMOBILIZER KNEE 20 THIGH 36 (SOFTGOODS) ×1 IMPLANT
KIT BASIN OR (CUSTOM PROCEDURE TRAY) ×2 IMPLANT
MANIFOLD NEPTUNE II (INSTRUMENTS) ×2 IMPLANT
NS IRRIG 1000ML POUR BTL (IV SOLUTION) ×2 IMPLANT
PACK TOTAL JOINT (CUSTOM PROCEDURE TRAY) ×2 IMPLANT
PAD ABD 7.5X8 STRL (GAUZE/BANDAGES/DRESSINGS) ×2 IMPLANT
PADDING CAST COTTON 6X4 STRL (CAST SUPPLIES) ×4 IMPLANT
POSITIONER SURGICAL ARM (MISCELLANEOUS) ×2 IMPLANT
SET HNDPC FAN SPRY TIP SCT (DISPOSABLE) ×1 IMPLANT
SPONGE GAUZE 4X4 12PLY (GAUZE/BANDAGES/DRESSINGS) ×2 IMPLANT
STRIP CLOSURE SKIN 1/2X4 (GAUZE/BANDAGES/DRESSINGS) ×4 IMPLANT
SUCTION FRAZIER 12FR DISP (SUCTIONS) ×2 IMPLANT
SUT MNCRL AB 4-0 PS2 18 (SUTURE) ×2 IMPLANT
SUT PDS AB 1 CT1 27 (SUTURE) ×6 IMPLANT
SUT VIC AB 2-0 CT1 27 (SUTURE) ×6
SUT VIC AB 2-0 CT1 TAPERPNT 27 (SUTURE) ×3 IMPLANT
SUT VLOC 180 0 24IN GS25 (SUTURE) ×2 IMPLANT
TOWEL OR 17X26 10 PK STRL BLUE (TOWEL DISPOSABLE) ×4 IMPLANT
TRAY FOLEY CATH 14FRSI W/METER (CATHETERS) ×2 IMPLANT
WATER STERILE IRR 1500ML POUR (IV SOLUTION) ×2 IMPLANT
WRAP KNEE MAXI GEL POST OP (GAUZE/BANDAGES/DRESSINGS) ×4 IMPLANT

## 2011-08-24 NOTE — Anesthesia Postprocedure Evaluation (Signed)
  Anesthesia Post-op Note  Patient: [name]  Procedure(s) Performed: Procedure(s) (LRB): TOTAL KNEE ARTHROPLASTY (Left)  Patient Location: PACU  Anesthesia Type: General  Level of Consciousness: awake and alert   Airway and Oxygen Therapy: Patient Spontanous Breathing  Post-op Pain: mild  Post-op Assessment: Post-op Vital signs reviewed, Patient's Cardiovascular Status Stable, Respiratory Function Stable, Patent Airway and No signs of Nausea or vomiting  Post-op Vital Signs: stable  Complications: No apparent anesthesia complications

## 2011-08-24 NOTE — Plan of Care (Signed)
Problem: Consults Goal: Diagnosis- Total Joint Replacement Outcome: Completed/Met Date Met:  08/24/11 Primary Total Knee     

## 2011-08-24 NOTE — Interval H&P Note (Signed)
History and Physical Interval Note:  08/24/2011 8:23 AM  [name]  has presented today for surgery, with the diagnosis of osteoarthritis left knee  The various methods of treatment have been discussed with the patient and family. After consideration of risks, benefits and other options for treatment, the patient has consented to  Procedure(s) (LRB): TOTAL KNEE ARTHROPLASTY (Left) as a surgical intervention .  The patients' history has been reviewed, patient examined, no change in status, stable for surgery.  I have reviewed the patients' chart and labs.  Questions were answered to the patient's satisfaction.     Loanne Drilling

## 2011-08-24 NOTE — Progress Notes (Signed)
Utilization review completed.  

## 2011-08-24 NOTE — Op Note (Signed)
Pre-operative diagnosis- Osteoarthritis  Left knee(s)  Post-operative diagnosis- Osteoarthritis Left knee(s)  Procedure-  Left  Total Knee Arthroplasty  Surgeon- Gus Rankin. Nissa Stannard, MD  Assistant- Avel Peace, PA-C   Anesthesia-  General EBL-* No blood loss amount entered *  Drains Hemovac  Tourniquet time-  Total Tourniquet Time Documented: Thigh (Left) - 31 minutes   Complications- None  Condition-PACU - hemodynamically stable.   Brief Clinical Note  [name] is a 77 y.o. year old female with end stage OA of her left knee with progressively worsening pain and dysfunction. She has constant pain, with activity and at rest and significant functional deficits with difficulties even with ADLs. She has had extensive non-op management including analgesics, injections of cortisone and viscosupplements, and home exercise program, but remains in significant pain with significant dysfunction. Radiographs show bone on bone arthritis medial and patellofemoral with osteophyte formation. She presents now for left Total Knee Arthroplasty.    Procedure in detail---   The patient is brought into the operating room and positioned supine on the operating table. After successful administration of  General anesthesia,   a tourniquet is placed high on the  Left thigh(s) and the lower extremity is prepped and draped in the usual sterile fashion. Time out is performed by the operating team and then the  Left lower extremity is wrapped in Esmarch, knee flexed and the tourniquet inflated to 300 mmHg.       A midline incision is made with a ten blade through the subcutaneous tissue to the level of the extensor mechanism. A fresh blade is used to make a medial parapatellar arthrotomy. Soft tissue over the proximal medial tibia is subperiosteally elevated to the joint line with a knife and into the semimembranosus bursa with a Cobb elevator. Soft tissue over the proximal lateral tibia is elevated with attention  being paid to avoiding the patellar tendon on the tibial tubercle. The patella is everted, knee flexed 90 degrees and the ACL and PCL are removed. Findings are bone on bone medial and patellofemoral with large medial osteophytes.        The drill is used to create a starting hole in the distal femur and the canal is thoroughly irrigated with sterile saline to remove the fatty contents. The 5 degree Left  valgus alignment guide is placed into the femoral canal and the distal femoral cutting block is pinned to remove 10 mm off the distal femur. Resection is made with an oscillating saw.      The tibia is subluxed forward and the menisci are removed. The extramedullary alignment guide is placed referencing proximally at the medial aspect of the tibial tubercle and distally along the second metatarsal axis and tibial crest. The block is pinned to remove 2mm off the more deficient medial  side. Resection is made with an oscillating saw. Size 2is the most appropriate size for the tibia and the proximal tibia is prepared with the modular drill and keel punch for that size.      The femoral sizing guide is placed and size 2.5 is most appropriate. Rotation is marked off the epicondylar axis and confirmed by creating a rectangular flexion gap at 90 degrees. The size 2.5 cutting block is pinned in this rotation and the anterior, posterior and chamfer cuts are made with the oscillating saw. The intercondylar block is then placed and that cut is made.      Trial size 2 tibial component, trial size 2.5 posterior stabilized femur  and a 12.5  mm posterior stabilized rotating platform insert trial is placed. Full extension is achieved with excellent varus/valgus and anterior/posterior balance throughout full range of motion. The patella is everted and thickness measured to be 22  mm. Free hand resection is taken to 12 mm, a 35 template is placed, lug holes are drilled, trial patella is placed, and it tracks normally. Osteophytes  are removed off the posterior femur with the trial in place. All trials are removed and the cut bone surfaces prepared with pulsatile lavage. Cement is mixed and once ready for implantation, the size 2 tibial implant, size  2.5 posterior stabilized femoral component, and the size 35 patella are cemented in place and the patella is held with the clamp. The trial insert is placed and the knee held in full extension. All extruded cement is removed and once the cement is hard the permanent 12.5 mm posterior stabilized rotating platform insert is placed into the tibial tray.      The wound is copiously irrigated with saline solution and the extensor mechanism closed over a hemovac drain with #1 PDS suture. The tourniquet is released for a total tourniquet time of 31  minutes. Flexion against gravity is 140 degrees and the patella tracks normally. Subcutaneous tissue is closed with 2.0 vicryl and subcuticular with running 4.0 Monocryl. The catheter for the Marcaine pain pump is placed and the pump is initiated. The incision is cleaned and dried and steri-strips and a bulky sterile dressing are applied. The limb is placed into a knee immobilizer and the patient is awakened and transported to recovery in stable condition.      Please note that a surgical assistant was a medical necessity for this procedure in order to perform it in a safe and expeditious manner. Surgical assistant was necessary to retract the ligaments and vital neurovascular structures to prevent injury to them and also necessary for proper positioning of the limb to allow for anatomic placement of the prosthesis.   Gus Rankin Buryl Bamber, MD    08/24/2011, 9:30 AM

## 2011-08-24 NOTE — Preoperative (Signed)
Beta Blockers   Reason not to administer Beta Blockers:Not Applicable 

## 2011-08-24 NOTE — H&P (View-Only) (Signed)
Terrace M. Min  DOB: 15-Oct-1934 Married / Language: English / Race: White Female  Date of Admission:  08/24/2011  Chief Complaint:  Left Knee Pain  History of Present Illness The patient is a 77 year old female who comes in for a preoperative History and Physical. The patient is scheduled for a left total knee arthroplasty to be performed by Dr. Gus Rankin. Aluisio, MD at [facility] on 08/24/2011. The patient is a 77 year old female who is postop for her right total knee arthroplasty. Overall the patient feels that the right knee is doing well. Post operative pain has been mild. The patient does report pain The patient does indicate that these symptoms are gradually improving. No pain medications are being taken. The patient is utilzing a home exercise program. Ms. Giuliano states that the right knee is doing well. The left knee is causing a lot of problems and is progressively getting worse. She is now at a stage where she wants to consider intervention for the left knee. They have been treated conservatively in the past for the above stated problem and despite conservative measures, they continue to have progressive pain and severe functional limitations and dysfunction. They have failed non-operative management. It is felt that they would benefit from undergoing total joint replacement. Risks and benefits of the procedure have been discussed with the patient and they elect to proceed with surgery. There are no active contraindications to surgery such as ongoing infection or rapidly progressive neurological disease.  Allergies NKDA  Intolerances CODEINE. 04/30/1999 Nausea. intolerance PENICILLIN. 04/30/1999 Diarrhea, Nausea. Amoxicillin *PENICILLINS*. Diarrhea, Nausea.  Medication History AmLODIPine Besylate (5MG  Tablet, Oral daily) Active. Aspirin Child (81MG  Tablet Chewable, Oral at bedtime) Active. Atenolol (50MG  Tablet, Oral two times daily) Active. Naproxen (500MG   Tablet, Oral daily) Active. Omega 3 (1200MG  Capsule, Oral) Active. Omeprazole (20MG  Capsule DR, Oral daily) Active. Simvastatin (40MG  Tablet, Oral at bedtime) Active. Micardis HCT (80-12.5MG  Tablet, Oral daily) Active.  Problem List/Past Medical Trigger finger (727.03). 10/17/2010 Gastroesophageal Reflux Disease High blood pressure Hypercholesterolemia Migraine Headache Osteoarthritis Other disease, cancer, significant illness Sleep Apnea. uses CPAP Migraine Headache Impaired Vision. wears glasses Impaired Hearing. hearing aids Macular Degeneration. Left eye Cataract. Right eye Pacemaker. Due to bradycardia - History of Mobitz Type II Atrioventricular block Mobitz Type II Incomplete Atrioventricular Block Hiatal Hernia Hemorrhoids  Family History Cancer. mother Congestive Heart Failure. mother Diabetes Mellitus. mother Drug / Alcohol Addiction. father Heart Disease. father and grandfather fathers side Heart disease in female family member before age 75 Hypertension. mother and father Osteoarthritis. mother and sister Father. Deceased, Emphysema. age 77 Mother. Deceased, Breast cancer. age 42  Social History Alcohol use. never consumed alcohol Children. 3 Current work status. retired Financial planner (Currently). no Drug/Alcohol Rehab (Previously). no Exercise. Exercises weekly; does other Illicit drug use. no Living situation. live with spouse Marital status. married Number of flights of stairs before winded. 1 Pain Contract. no Tobacco / smoke exposure. no Tobacco use. Never smoker. never smoker Post-Surgical Plans. Plan is to go home after the hospital stay. Advance Directives. Living Will, Healthcare POA  Past Surgical History Arthroscopy of Knee. left Carpal Tunnel Repair. Date: 2011. bilateral Cataract Surgery. left Cesarean Delivery. 2 times Foot Surgery. left Tonsillectomy Total Knee Replacement. Date:  11/17/2010. right Tubal Ligation Dilation and Curettage of Uterus - Multiple. 1961, 1968  Review of Systems General:Not Present- Chills, Fever, Night Sweats, Appetite Loss, Fatigue, Feeling sick, Weight Gain and Weight Loss. Skin:Not Present- Itching, Rash, Skin Color  Changes, Ulcer, Psoriasis and Change in Hair or Nails. HEENT:Present- Hearing Loss and Decreased Hearing. Not Present- Sensitivity to light, Hearing problems, Nose Bleed and Ringing in the Ears. Neck:Not Present- Swollen Glands and Neck Mass. Respiratory:Not Present- Snoring, Chronic Cough, Bloody sputum and Dyspnea. Cardiovascular:Not Present- Shortness of Breath, Chest Pain, Swelling of Extremities, Leg Cramps and Palpitations. Gastrointestinal:Not Present- Bloody Stool, Heartburn, Abdominal Pain, Vomiting, Nausea and Incontinence of Stool. Female Genitourinary:Present- Incontinence. Not Present- Blood in Urine, Menstrual Irregularities, Frequency and Nocturia. Musculoskeletal:Present- Joint Pain. Not Present- Muscle Weakness, Muscle Pain, Joint Stiffness, Joint Swelling and Back Pain. Neurological:Not Present- Tingling, Numbness, Burning, Tremor, Headaches and Dizziness. Psychiatric:Not Present- Anxiety, Depression and Memory Loss. Endocrine:Not Present- Cold Intolerance, Heat Intolerance, Excessive hunger and Excessive Thirst. Hematology:Not Present- Abnormal Bleeding, Anemia, Blood Clots and Easy Bruising.  Vitals Weight: 200 lb Height: 61 in Body Surface Area: 1.98 m Body Mass Index: 37.79 kg/m Pulse: 74 (Regular) Resp.: 12 (Unlabored) BP: 134/78 (Sitting, Left Arm, Standard)  Physical Exam The physical exam findings are as follows: Patient is a 77 year old female with continued knee pain.   General Mental Status - Alert, cooperative and good historian. General Appearance- pleasant. Not in acute distress. Orientation- Oriented X3. Build & Nutrition- Well nourished and Well  developed.   Head and Neck Head- normocephalic, atraumatic . Neck Global Assessment- supple. no bruit auscultated on the right and no bruit auscultated on the left.   Eye Pupil- Bilateral- Regular and Round. Motion- Bilateral- EOMI.   Chest and Lung Exam Auscultation: Breath sounds:- clear at anterior chest wall and - clear at posterior chest wall. Adventitious sounds:- No Adventitious sounds.   Cardiovascular Auscultation:Rhythm- Regular rate and rhythm. Heart Sounds- S1 WNL and S2 WNL. Murmurs & Other Heart Sounds:Auscultation of the heart reveals - No Murmurs.   Abdomen Inspection:Contour- Generalized mild distention. Palpation/Percussion:Tenderness- Abdomen is non-tender to palpation. Rigidity (guarding)- Abdomen is soft. Auscultation:Auscultation of the abdomen reveals - Bowel sounds normal.   Female Genitourinary Not done, not pertinent to present illness  Musculoskeletal On exam she is in no distress. The right knee looks fine. There is no swelling. Range of motion about 3 to 122 degrees. There is no instability noted. Her left knee shows slight varus, range 5 to 120. Moderate crepitus on range of motion with no instability.  RADIOGRAPHS: X rays - AP and lateral look excellent. Right knee prosthesis shows no abnormalities. She does have arthritis in the left knee.  EKG (not done in this office) - per Dr. Yevonne Pax office note, the EKG shows normal sinus rhythm with ventricular pacing which is tracking the atrium normally.  Assessment & Plan Osteoarthritis Left Knee  Patient is for a Left Total Knee Replacement by Dr. Lequita Halt.  Plan is to go home.  PCP - Dr. Jarome Matin - Patient has been seen by Dr. Eloise Harman and felt to be stable for surgery. Dr. Eloise Harman would like to be notified FYI when the patient comes in for surgery.  Cards - Dr. Ronny Flurry - Patient has been seen by Dr. Patty Sermons preoperatively and flet to be  stable from a cardiac standpoint for the proposed knee surgery. He will see her back 6 months in follow up. He recommended her staying on the same medications at this time.  Avel Peace, PA-C

## 2011-08-24 NOTE — Transfer of Care (Signed)
Immediate Anesthesia Transfer of Care Note  Patient: [name]  Procedure(s) Performed: Procedure(s) (LRB): TOTAL KNEE ARTHROPLASTY (Left)  Patient Location: PACU  Anesthesia Type: General  Level of Consciousness: awake, alert , oriented and patient cooperative  Airway & Oxygen Therapy: Patient Spontanous Breathing and Patient connected to face mask oxygen  Post-op Assessment: Report given to PACU RN, Post -op Vital signs reviewed and stable and Patient moving all extremities X 4  Post vital signs: Reviewed and stable  Complications: No apparent anesthesia complications

## 2011-08-25 LAB — BASIC METABOLIC PANEL
CO2: 24 mEq/L (ref 19–32)
Calcium: 9.1 mg/dL (ref 8.4–10.5)
Chloride: 106 mEq/L (ref 96–112)
Creatinine, Ser: 0.89 mg/dL (ref 0.50–1.10)
Glucose, Bld: 143 mg/dL — ABNORMAL HIGH (ref 70–99)

## 2011-08-25 LAB — CBC
Hemoglobin: 10.1 g/dL — ABNORMAL LOW (ref 12.0–15.0)
MCH: 29.1 pg (ref 26.0–34.0)
MCV: 85.3 fL (ref 78.0–100.0)
RBC: 3.47 MIL/uL — ABNORMAL LOW (ref 3.87–5.11)
WBC: 10.1 10*3/uL (ref 4.0–10.5)

## 2011-08-25 MED ORDER — OMEPRAZOLE 20 MG PO CPDR
20.0000 mg | DELAYED_RELEASE_CAPSULE | Freq: Every day | ORAL | Status: DC
  Administered 2011-08-25 – 2011-08-27 (×3): 20 mg via ORAL
  Filled 2011-08-25 (×4): qty 1

## 2011-08-25 MED ORDER — NON FORMULARY: 20.0000 mg | Freq: Every day | Status: DC

## 2011-08-25 MED ORDER — SODIUM CHLORIDE 0.9 % IV SOLN: INTRAVENOUS | Status: DC

## 2011-08-25 MED ORDER — SODIUM CHLORIDE 0.9 % IV SOLN
INTRAVENOUS | Status: AC
  Administered 2011-08-25: 08:00:00 via INTRAVENOUS

## 2011-08-25 MED ORDER — SODIUM CHLORIDE 0.9 % IV BOLUS (SEPSIS)
250.0000 mL | Freq: Once | INTRAVENOUS | Status: AC
  Administered 2011-08-25: 250 mL via INTRAVENOUS

## 2011-08-25 NOTE — Evaluation (Signed)
Physical Therapy Evaluation Patient Details Name: [name] MRN: [id number] DOB: 13-Mar-1935 Today's Date: 08/25/2011 Time: 4098-1191 PT Time Calculation (min): 28 min  PT Assessment / Plan / Recommendation Clinical Impression  Pt s/p L TKA.  Pt would benefit from acute PT services in order to improve L LE strength and ROM to increase independence with transfers, ambulation and stairs for safe d/c home with spouse.    PT Assessment  Patient needs continued PT services    Follow Up Recommendations  Home health PT    Equipment Recommendations  None recommended by PT    Frequency 7X/week    Precautions / Restrictions Precautions Precautions: Knee Required Braces or Orthoses: Knee Immobilizer - Left Knee Immobilizer - Left: Discontinue once straight leg raise with < 10 degree lag Restrictions Weight Bearing Restrictions: Yes LLE Weight Bearing: Weight bearing as tolerated   Pertinent Vitals/Pain       Mobility  Bed Mobility Bed Mobility: Supine to Sit Supine to Sit: 5: Supervision;With rails;HOB elevated Details for Bed Mobility Assistance: verbal cues for technique Transfers Transfers: Sit to Stand;Stand to Sit Sit to Stand: 4: Min guard;With upper extremity assist;From bed Stand to Sit: 4: Min guard;With upper extremity assist;To chair/3-in-1 Details for Transfer Assistance: verbal cues for hand placement and L LE forward Ambulation/Gait Ambulation/Gait Assistance: 4: Min guard Ambulation Distance (Feet): 80 Feet Assistive device: Rolling walker Ambulation/Gait Assistance Details: verbal cues for sequence Gait Pattern: Step-to pattern Gait velocity: decreased    Exercises Total Joint Exercises Ankle Circles/Pumps: AROM;Both;20 reps;Supine Quad Sets: Strengthening;Both;20 reps;Supine   PT Goals Acute Rehab PT Goals PT Goal Formulation: With patient Time For Goal Achievement: 09/01/11 Potential to Achieve Goals: Good Pt will go Sit to Stand: with modified  independence PT Goal: Sit to Stand - Progress: Goal set today Pt will go Stand to Sit: with modified independence PT Goal: Stand to Sit - Progress: Goal set today Pt will Ambulate: >150 feet;with modified independence;with least restrictive assistive device PT Goal: Ambulate - Progress: Goal set today Pt will Go Up / Down Stairs: 1-2 stairs;with supervision;with least restrictive assistive device PT Goal: Up/Down Stairs - Progress: Goal set today Pt will Perform Home Exercise Program: with supervision, verbal cues required/provided PT Goal: Perform Home Exercise Program - Progress: Goal set today  Visit Information  Last PT Received On: 08/25/11 Assistance Needed: +1    Subjective Data  Subjective: "I had my other knee done July 30th."   Prior Functioning  Home Living Lives With: Spouse Type of Home: House Home Access: Stairs to enter [employer] of Steps: 1 and 1 Entrance Stairs-Rails: Right Home Layout: One level Bathroom Shower/Tub: Health visitor: Handicapped height Home Adaptive Equipment: Shower chair with back;Walker - rolling;Straight cane Prior Function Level of Independence: Independent Communication Communication: No difficulties    Cognition  Overall Cognitive Status: Appears within functional limits for tasks assessed/performed Arousal/Alertness: Awake/alert Orientation Level: Appears intact for tasks assessed Behavior During Session: [facility] for tasks performed    Extremity/Trunk Assessment Right Upper Extremity Assessment RUE ROM/Strength/Tone: [facility] for tasks assessed Left Upper Extremity Assessment LUE ROM/Strength/Tone: WFL for tasks assessed Right Lower Extremity Assessment RLE ROM/Strength/Tone: [facility] for tasks assessed Left Lower Extremity Assessment LLE ROM/Strength/Tone: Deficits LLE ROM/Strength/Tone Deficits: fair quad contraction, ROM TBA   Balance    End of Session PT - End of Session Equipment Utilized During Treatment:  Left knee immobilizer Activity Tolerance: Patient tolerated treatment well Patient left: in chair;with call bell/phone within reach CPM Left  Knee CPM Left Knee: Off   [name],KATHrine E 08/25/2011, 11:09 AM Pager: [phone]

## 2011-08-25 NOTE — Progress Notes (Signed)
Set pt up on cpap with 2 lpm o2  Using her own mask from home.  Tolerating well, no issues to report

## 2011-08-25 NOTE — Progress Notes (Signed)
OT Screen Order received, chart reviewed. Spoke briefly with pt who had the other knee replaced in July. Pt will have necessary level of A from husband upon d/c and has all necessary DME. Pt presents with no OT needs at this time. Will sign off.  Garrel Ridgel, OTR/L  Pager [phone] 08/25/2011

## 2011-08-25 NOTE — Progress Notes (Signed)
Subjective: 1 Day Post-Op Procedure(s) (LRB): TOTAL KNEE ARTHROPLASTY (Left) Patient reports pain as mild.   Patient seen in rounds with Dr. Lequita Halt. Patient is well, and has had no acute complaints or problems We will start therapy today.  Plan is to go Home after hospital stay.  Objective: Vital signs in last 24 hours: Temp:  [97.3 F (36.3 C)-98.2 F (36.8 C)] 98 F (36.7 C) (05/07 1610) Pulse Rate:  [60-88] 77  (05/07 0633) Resp:  [16] 16  (05/07 0633) BP: (122-160)/(65-82) 154/81 mmHg (05/07 0633) SpO2:  [93 %-100 %] 100 % (05/07 0633) FiO2 (%):  [99 %] 99 % (05/06 1123) Weight:  [93.441 kg (206 lb)] 93.441 kg (206 lb) (05/06 1123)  Intake/Output from previous day:  Intake/Output Summary (Last 24 hours) at 08/25/11 0805 Last data filed at 08/25/11 0634  Gross per 24 hour  Intake 3673.75 ml  Output   4330 ml  Net -656.25 ml    Intake/Output this shift: 1600 since MN  Labs:  Basename 08/25/11 0400  HGB 10.1*    Basename 08/25/11 0400  WBC 10.1  RBC 3.47*  HCT 29.6*  PLT 234    Basename 08/25/11 0400  NA 139  K 3.7  CL 106  CO2 24  BUN 17  CREATININE 0.89  GLUCOSE 143*  CALCIUM 9.1   No results found for this basename: LABPT:2,INR:2 in the last 72 hours  EXAM General - Patient is Alert, Appropriate and Oriented Extremity - Neurovascular intact Sensation intact distally Dressing - dressing C/D/I Motor Function - intact, moving foot and toes well on exam.  Hemovac pulled without difficulty.  Past Medical History  Diagnosis Date  . Hypertension   . Abnormal heart rhythm   . Hyperlipidemia   . Chronic headaches   . Sleep apnea     on CPAP-uses [employer]  . Second degree Mobitz II AV block     s/p PPM by Dr Reyes Ivan 2010  . Hypercholesteremia   . Obstructive sleep apnea on CPAP   . Cardiomegaly   . Atypical chest pain   . Osteoarthritis   . Muscle degeneration   . Urinary incontinence   . Dysrhythmia     HX OF MOBITZ 2 AV BLOCK AND  FUNCTIONING PACEMAKER -DR. JAMES ALLRED FOLLOWS PT'S PACEMAKER CARE  . Recurrent upper respiratory infection (URI)     MARCH 2013 DX WITH BRONCHITIS--PT STATES RESOLVED--DOES HAVE SOME ALLERGIES AT PRESENT  . Headache     PT STATES HER CHRONIC H/A'S HAVE RESOLVED  . GERD (gastroesophageal reflux disease)     Assessment/Plan: 1 Day Post-Op Procedure(s) (LRB): TOTAL KNEE ARTHROPLASTY (Left) Principal Problem:  *OA (osteoarthritis) of knee   Advance diet Up with therapy Continue foley due to strict I&O and urinary output monitoring Discharge home with home health  DVT Prophylaxis - Xarelto Weight-Bearing as tolerated to left leg Keep foley until tomorrow. No vaccines. D/C PCA, Change to IV push D/C O2 and Pulse OX and try on Room Air  [name] 08/25/2011, 8:05 AM

## 2011-08-25 NOTE — Progress Notes (Signed)
Physical Therapy Treatment Note   08/25/11 1500  PT Visit Information  Last PT Received On 08/25/11  Assistance Needed +1  PT Time Calculation  PT Start Time 1418  PT Stop Time 1447  PT Time Calculation (min) 29 min  Subjective Data  Subjective "I just want this knee to go better than the last one."  Precautions  Precautions Knee  Required Braces or Orthoses Knee Immobilizer - Left  Knee Immobilizer - Left Discontinue once straight leg raise with < 10 degree lag  Restrictions  LLE Weight Bearing WBAT  Cognition  Overall Cognitive Status Appears within functional limits for tasks assessed/performed  Bed Mobility  Bed Mobility Sit to Supine  Sit to Supine 5: Supervision;HOB flat  Details for Bed Mobility Assistance verbal cues for technique  Transfers  Transfers Sit to Stand;Stand to Sit  Sit to Stand 4: Min guard;With upper extremity assist;From chair/3-in-1  Stand to Sit 4: Min guard;With upper extremity assist;To bed  Details for Transfer Assistance verbal cues for hand placement and L LE forward  Ambulation/Gait  Ambulation/Gait Assistance 4: Min guard  Ambulation Distance (Feet) 40 Feet  Assistive device Rolling walker  Ambulation/Gait Assistance Details one verbal cue for sequence upon initiating gait  Gait Pattern Step-to pattern  Gait velocity decreased  Total Joint Exercises  Quad Sets Strengthening;Left;10 reps;Supine  Short Arc Quad AROM;Strengthening;Left;Other reps (comment);Supine (20 reps)  Heel Slides AAROM;Left;10 reps;Supine (2 sets of 10)  Hip ABduction/ADduction AROM;Strengthening;Left;20 reps;Supine  Straight Leg Raises AAROM;Left;10 reps;Supine  Goniometric ROM L knee AAROM 0-55*  PT - End of Session  Equipment Utilized During Treatment Left knee immobilizer  Activity Tolerance Patient tolerated treatment well  Patient left in bed;with call bell/phone within reach (with ortho tech)  PT - Assessment/Plan  Comments on Treatment Session Pt did well  with therapy this afternoon.  Able to tolerate ambulation x2 today and performed exercises.  Ortho tech came in end of session to apply CPM.  PT Plan Discharge plan remains appropriate;Frequency remains appropriate  Follow Up Recommendations Home health PT  Equipment Recommended None recommended by PT  Acute Rehab PT Goals  PT Goal: Sit to Stand - Progress Progressing toward goal  PT Goal: Stand to Sit - Progress Progressing toward goal  PT Goal: Ambulate - Progress Progressing toward goal  PT Goal: Perform Home Exercise Program - Progress Progressing toward goal    Zenovia Jarred, PT Pager: [phone]

## 2011-08-26 LAB — BASIC METABOLIC PANEL
CO2: 26 mEq/L (ref 19–32)
Calcium: 8.9 mg/dL (ref 8.4–10.5)
Chloride: 106 mEq/L (ref 96–112)
Potassium: 3.6 mEq/L (ref 3.5–5.1)
Sodium: 140 mEq/L (ref 135–145)

## 2011-08-26 LAB — CBC
Hemoglobin: 9.5 g/dL — ABNORMAL LOW (ref 12.0–15.0)
MCH: 29.1 pg (ref 26.0–34.0)
Platelets: 213 10*3/uL (ref 150–400)
RBC: 3.26 MIL/uL — ABNORMAL LOW (ref 3.87–5.11)
WBC: 9.9 10*3/uL (ref 4.0–10.5)

## 2011-08-26 MED ORDER — POLYSACCHARIDE IRON COMPLEX 150 MG PO CAPS
150.0000 mg | ORAL_CAPSULE | Freq: Every day | ORAL | Status: DC
  Administered 2011-08-26 – 2011-08-27 (×2): 150 mg via ORAL
  Filled 2011-08-26 (×2): qty 1

## 2011-08-26 NOTE — Progress Notes (Signed)
Physical Therapy Treatment Note   08/26/11 1400  PT Visit Information  Last PT Received On 08/26/11  Assistance Needed +1  PT Time Calculation  PT Start Time 1321  PT Stop Time 1358  PT Time Calculation (min) 37 min  Subjective Data  Subjective "I'm feeling better after I got nausea meds."  Precautions  Precautions Knee  Required Braces or Orthoses Knee Immobilizer - Left  Knee Immobilizer - Left Discontinue once straight leg raise with < 10 degree lag  Restrictions  LLE Weight Bearing WBAT  Cognition  Overall Cognitive Status Appears within functional limits for tasks assessed/performed  Bed Mobility  Bed Mobility Sit to Supine  Sit to Supine 5: Supervision;HOB flat  Details for Bed Mobility Assistance verbal cues for technique  Transfers  Transfers Sit to Stand;Stand to Sit  Sit to Stand 4: Min guard;With upper extremity assist;From chair/3-in-1  Stand to Sit 4: Min guard;With upper extremity assist;To bed  Details for Transfer Assistance verbal cues for hand placement when assisted to raised toilet  Ambulation/Gait  Ambulation/Gait Assistance 4: Min guard  Ambulation Distance (Feet) 40 Feet  Assistive device Rolling walker  Ambulation/Gait Assistance Details pt does well with ambulation, decreased speed  Gait Pattern Step-to pattern  Total Joint Exercises  Ankle Circles/Pumps AROM;Both;20 reps;Supine  Quad Sets Strengthening;Supine;20 reps;Both  Short Arc [employer];Other reps (comment);Supine;AAROM (20 reps)  Heel Slides AAROM;Left;Supine;20 reps (2 sets of 10)  Hip ABduction/ADduction AROM;Strengthening;Left;20 reps;Supine  Goniometric ROM L knee AAROM flexion 55* limited by pain  PT - End of Session  Equipment Utilized During Treatment Left knee immobilizer  Activity Tolerance Patient tolerated treatment well  Patient left in bed;with call bell/phone within reach  PT - Assessment/Plan  Comments on Treatment Session Pt continues to perform well with  ambulation.  Pt performed exercises in bed.  Discussed practicing stairs in AM prior to d/c.  PT Plan Discharge plan remains appropriate;Frequency remains appropriate  Follow Up Recommendations Home health PT  Equipment Recommended None recommended by PT  Acute Rehab PT Goals  PT Goal: Sit to Stand - Progress Progressing toward goal  PT Goal: Stand to Sit - Progress Progressing toward goal  PT Goal: Ambulate - Progress Progressing toward goal  PT Goal: Perform Home Exercise Program - Progress Progressing toward goal    Zenovia Jarred, PT Pager: [phone]

## 2011-08-26 NOTE — Progress Notes (Signed)
Subjective: 2 Days Post-Op Procedure(s) (LRB): TOTAL KNEE ARTHROPLASTY (Left) Patient reports pain as mild.   Patient seen in rounds with Dr. Lequita Halt. Patient is well, and has had no acute complaints or problems Plan is to go Home after hospital stay.  Objective: Vital signs in last 24 hours: Temp:  [97.8 F (36.6 C)-98.6 F (37 C)] 98.6 F (37 C) (05/08 0551) Pulse Rate:  [79-86] 79  (05/08 0551) Resp:  [15-16] 16  (05/08 0551) BP: (109-137)/(69) 109/69 mmHg (05/08 0551) SpO2:  [95 %-98 %] 96 % (05/08 0551)  Intake/Output from previous day:  Intake/Output Summary (Last 24 hours) at 08/26/11 1200 Last data filed at 08/26/11 1000  Gross per 24 hour  Intake 2235.42 ml  Output   3200 ml  Net -964.58 ml    Intake/Output this shift: Total I/O In: 350 [P.O.:350] Out: 500 [Urine:500]  Labs:  Basename 08/26/11 0414 08/25/11 0400  HGB 9.5* 10.1*    Basename 08/26/11 0414 08/25/11 0400  WBC 9.9 10.1  RBC 3.26* 3.47*  HCT 28.2* 29.6*  PLT 213 234    Basename 08/26/11 0414 08/25/11 0400  NA 140 139  K 3.6 3.7  CL 106 106  CO2 26 24  BUN 17 17  CREATININE 1.00 0.89  GLUCOSE 118* 143*  CALCIUM 8.9 9.1   No results found for this basename: LABPT:2,INR:2 in the last 72 hours  EXAM General - Patient is Alert, Appropriate and Oriented Extremity - Neurovascular intact Sensation intact distally Dressing/Incision - clean, dry, no drainage, healing Motor Function - intact, moving foot and toes well on exam.   Past Medical History  Diagnosis Date  . Hypertension   . Abnormal heart rhythm   . Hyperlipidemia   . Chronic headaches   . Sleep apnea     on CPAP-uses [employer]  . Second degree Mobitz II AV block     s/p PPM by Dr Reyes Ivan 2010  . Hypercholesteremia   . Obstructive sleep apnea on CPAP   . Cardiomegaly   . Atypical chest pain   . Osteoarthritis   . Muscle degeneration   . Urinary incontinence   . Dysrhythmia     HX OF MOBITZ 2 AV BLOCK AND FUNCTIONING  PACEMAKER -DR. JAMES ALLRED FOLLOWS PT'S PACEMAKER CARE  . Recurrent upper respiratory infection (URI)     MARCH 2013 DX WITH BRONCHITIS--PT STATES RESOLVED--DOES HAVE SOME ALLERGIES AT PRESENT  . Headache     PT STATES HER CHRONIC H/A'S HAVE RESOLVED  . GERD (gastroesophageal reflux disease)     Assessment/Plan: 2 Days Post-Op Procedure(s) (LRB): TOTAL KNEE ARTHROPLASTY (Left) Principal Problem:  *OA (osteoarthritis) of knee   Up with therapy Plan for discharge tomorrow Discharge home with home health  DVT Prophylaxis - Xarelto Weight-Bearing as tolerated to left leg  Towana Stenglein 08/26/2011, 12:00 PM

## 2011-08-26 NOTE — Progress Notes (Signed)
Physical Therapy Treatment Patient Details Name: [name] MRN: [id number] DOB: 02-13-1935 Today's Date: 08/26/2011 Time: 4098-1191 PT Time Calculation (min): 23 min  PT Assessment / Plan / Recommendation Comments on Treatment Session  Pt able to increase ambulation distance this morning however reports not feeling as well today as yesterday.    Follow Up Recommendations  Home health PT    Equipment Recommendations  None recommended by PT    Frequency     Plan Discharge plan remains appropriate;Frequency remains appropriate    Precautions / Restrictions Precautions Precautions: Knee Required Braces or Orthoses: Knee Immobilizer - Left Knee Immobilizer - Left: Discontinue once straight leg raise with < 10 degree lag Restrictions LLE Weight Bearing: Weight bearing as tolerated   Pertinent Vitals/Pain     Mobility  Bed Mobility Bed Mobility: Supine to Sit Supine to Sit: 5: Supervision Transfers Transfers: Sit to Stand;Stand to Sit Sit to Stand: 4: Min guard;With upper extremity assist;From bed Stand to Sit: 4: Min guard;With upper extremity assist;To chair/3-in-1 Details for Transfer Assistance: min/guard since pt reports not feeling as well today.  pt able to perform safe technique without cuing Ambulation/Gait Ambulation/Gait Assistance: 4: Min guard Ambulation Distance (Feet): 100 Feet Assistive device: Rolling walker Ambulation/Gait Assistance Details: verbal cue for RW distance Gait Pattern: Step-to pattern Gait velocity: decreased    Exercises     PT Goals Acute Rehab PT Goals PT Goal: Sit to Stand - Progress: Progressing toward goal PT Goal: Stand to Sit - Progress: Progressing toward goal PT Goal: Ambulate - Progress: Progressing toward goal  Visit Information  Last PT Received On: 08/26/11 Assistance Needed: +1    Subjective Data  Subjective: "I'm not feeling as good today."   Cognition  Overall Cognitive Status: Appears within functional limits  for tasks assessed/performed    Balance     End of Session PT - End of Session Equipment Utilized During Treatment: Left knee immobilizer Activity Tolerance: Patient tolerated treatment well Patient left: in chair;with call bell/phone within reach    [facility] E 08/26/2011, 11:36 AM Pager: [phone]

## 2011-08-26 NOTE — Care Management Note (Signed)
    Page 1 of 2   08/27/2011     12:55:57 PM   CARE MANAGEMENT NOTE 08/27/2011  Patient:  [name], [name]   Account Number:  [id number]  Date Initiated:  08/26/2011  Documentation initiated by:  Colleen Can  Subjective/Objective Assessment:   dx osteoarthritis left knee; total knee replacemnt     Action/Plan:   CM spoke with patient. Plans are for patient to return to her home in [place] where family member will be caregiver. States she already has RW, shower chair and cane.   Anticipated DC Date:  08/27/2011   Anticipated DC Plan:  HOME W HOME HEALTH SERVICES  In-house referral  Clinical Social Worker      DC Planning Services  CM consult      [facility] Choice  HOME HEALTH  DURABLE MEDICAL EQUIPMENT   Choice offered to / List presented to:  C-1 Patient   DME arranged  CPM      DME agency  [facility]     HH arranged  HH-2 PT      [facility] agency  [facility]   Status of service:  Completed, signed off Medicare Important Message given?  NA - LOS <3 / Initial given by admissions (If response is "NO", the following Medicare IM given date fields will be blank) Date Medicare IM given:   Date Additional Medicare IM given:    Discharge Disposition:  HOME W HOME HEALTH SERVICES  Per UR Regulation:  Reviewed for med. necessity/level of care/duration of stay  If discussed at Long Length of Stay Meetings, dates discussed:    Comments:  08/27/2011 Raynelle Bring BSN CCM [phone] pt for discharge today. [facility] will provide [facility] services and DME; services will start tomorow 08/28/2011  08/26/2011 Raynelle Bring BSN CCM [phone] Choice of [facility] agencies offered. Pt states she wants [facility] and has already spoken with physical therapist-Frankie Samuel Bouche regarding rendering HHpt. [facility] rep notified-they will service patient with HHpt and DME(cpm machine ordered for home use). List of HH agencies placed on shadow  chart

## 2011-08-27 DIAGNOSIS — D62 Acute posthemorrhagic anemia: Secondary | ICD-10-CM | POA: Diagnosis not present

## 2011-08-27 LAB — CBC
Hemoglobin: 9.6 g/dL — ABNORMAL LOW (ref 12.0–15.0)
MCV: 86.3 fL (ref 78.0–100.0)
Platelets: 225 10*3/uL (ref 150–400)
RBC: 3.35 MIL/uL — ABNORMAL LOW (ref 3.87–5.11)
WBC: 9.3 10*3/uL (ref 4.0–10.5)

## 2011-08-27 MED ORDER — RIVAROXABAN 10 MG PO TABS: 10.0000 mg | ORAL_TABLET | Freq: Every day | ORAL | Status: DC

## 2011-08-27 MED ORDER — POLYSACCHARIDE IRON COMPLEX 150 MG PO CAPS: 150.0000 mg | ORAL_CAPSULE | Freq: Every day | ORAL | Status: DC

## 2011-08-27 MED ORDER — METHOCARBAMOL 500 MG PO TABS: 500.0000 mg | ORAL_TABLET | Freq: Four times a day (QID) | ORAL | Status: AC | PRN

## 2011-08-27 MED ORDER — HYDROMORPHONE HCL 2 MG PO TABS: 2.0000 mg | ORAL_TABLET | ORAL | Status: AC | PRN

## 2011-08-27 NOTE — Discharge Instructions (Signed)
 Dr. Frank Aluisio Total Joint Specialist [facility] [address] [place], [place] [address] [phone]  TOTAL KNEE REPLACEMENT POSTOPERATIVE DIRECTIONS    Knee Rehabilitation, Guidelines Following Surgery  Results after knee surgery are often greatly improved when you follow the exercise, range of motion and muscle strengthening exercises prescribed by your doctor. Safety measures are also important to protect the knee from further injury. Any time any of these exercises cause you to have increased pain or swelling in your knee joint, decrease the amount until you are comfortable again and slowly increase them. If you have problems or questions, call your caregiver or physical therapist for advice.   HOME CARE INSTRUCTIONS  Remove items at home which could result in a fall. This includes throw rugs or furniture in walking pathways.  Continue medications as instructed at time of discharge. You may have some home medications which will be placed on hold until you complete the course of blood thinner medication.  You may start showering once you are discharged home but do not submerge the incision under water.  Walk with walker as instructed.  You may put full weight on your legs and walk as much as is comfortable.  You may resume a sexual relationship in one month or when given the OK by your doctor.  You may return to work once you are cleared by your doctor.  Do not drive a car for 6 weeks unless released by you surgeon.  Wear the elastic stockings for three weeks following surgery during the day but you may remove then at night. Make sure you keep all of your appointments after your operation with all of your doctors and caregivers. You should call the office at the above phone number and make an appointment for approximately two weeks after the date of your surgery. Change the dressing daily and reapply a dry dressing each time. Please pick up a stool  softener and laxative for home use as long as you are requiring pain medications. Continue to use ice on the knee for pain and swelling from surgery. It is important for you to complete the blood thinner medication as prescribed by your doctor.  RANGE OF MOTION AND STRENGTHENING EXERCISES  Rehabilitation of the knee is important following a knee injury or an operation. After just a few days of immobilization, the muscles of the thigh which control the knee become weakened and shrink (atrophy). Knee exercises are designed to build up the tone and strength of the thigh muscles and to improve knee motion. Often times heat used for twenty to thirty minutes before working out will loosen up your tissues and help with improving the range of motion but do not use heat for the first two weeks following surgery. These exercises can be done on a training (exercise) mat, on the floor, on a table or on a bed. Use what ever works the best and is most comfortable for you Knee exercises include:  Leg Lifts - While your knee is still immobilized in a splint or cast, you can do straight leg raises. Lift the leg to 60 degrees, hold for 3 sec, and slowly lower the leg. Repeat 10-20 times 2-3 times daily. Perform this exercise against resistance later as your knee gets better.  Quad and Hamstring Sets - Tighten up the muscle on the front of the thigh (Quad) and hold for 5-10 sec. Repeat this 10-20 times hourly. Hamstring sets are done by pushing the foot backward against   an object and holding for 5-10 sec. Repeat as with quad sets.  A rehabilitation program following serious knee injuries can speed recovery and prevent re-injury in the future due to weakened muscles. Contact your doctor or a physical therapist for more information on knee rehabilitation.   SKILLED REHAB INSTRUCTIONS: If the patient is transferred to a skilled rehab facility following release from the hospital, a list of the current medications will be sent  to the facility for the patient to continue.  When discharged from the skilled rehab facility, please have the facility set up the patient's Home Health Physical Therapy prior to being released. Also, the skilled facility will be responsible for providing the patient with their medications at time of release from the facility to include their pain medication, the muscle relaxants, and their blood thinner medication. If the patient is still at the rehab facility at time of the two week follow up appointment, the skilled rehab facility will also need to assist the patient in arranging follow up appointment in our office and any transportation needs.  MAKE SURE YOU:  Understand these instructions.  Will watch your condition.  Will get help right away if you are not doing well or get worse.  Document Released: 04/06/2005 Document Revised: 03/26/2011 Document Reviewed: 09/24/2006  ExitCare Patient Information 2012 [employer].           

## 2011-08-27 NOTE — Progress Notes (Signed)
Physical Therapy Treatment Patient Details Name: [name] MRN: [id number] DOB: 05/25/1934 Today's Date: 08/27/2011 Time: 8295-6213 PT Time Calculation (min): 35 min  PT Assessment / Plan / Recommendation Comments on Treatment Session  Pt ambulated, performed step x2, and performed exercises this morning.  Pt feels ready for d/c later today.    Follow Up Recommendations  Home health PT    Barriers to Discharge        Equipment Recommendations  None recommended by PT    Recommendations for Other Services    Frequency     Plan Discharge plan remains appropriate;Frequency remains appropriate    Precautions / Restrictions Precautions Precautions: Knee Required Braces or Orthoses: Knee Immobilizer - Left Knee Immobilizer - Left: Discontinue once straight leg raise with < 10 degree lag Restrictions LLE Weight Bearing: Weight bearing as tolerated   Pertinent Vitals/Pain     Mobility  Bed Mobility Bed Mobility: Supine to Sit Supine to Sit: 6: Modified independent (Device/Increase time) Transfers Transfers: Sit to Stand;Stand to Sit Sit to Stand: 5: Supervision;With upper extremity assist;From chair/3-in-1 Stand to Sit: 5: Supervision;With upper extremity assist;To chair/3-in-1;To bed Ambulation/Gait Ambulation/Gait Assistance: 5: Supervision Ambulation Distance (Feet): 80 Feet Assistive device: Rolling walker Ambulation/Gait Assistance Details: pt performing ambulation very well Gait Pattern: Step-to pattern Gait velocity: decreased Stairs: Yes Stairs Assistance: 4: Min guard Stairs Assistance Details (indicate cue type and reason): verbal cues for sequence and safe technique Stair Management Technique: Step to pattern;Forwards;With walker Number of Stairs: 1  (x2)    Exercises Total Joint Exercises Ankle Circles/Pumps: AROM;Both;20 reps;Supine Quad Sets: Strengthening;Supine;20 reps;Both Short Arc Quad: Strengthening;Left;Other reps (comment);Supine;AROM (20  reps) Heel Slides: AAROM;Left;Supine;20 reps (2 sets of 10) Hip ABduction/ADduction: AROM;Strengthening;Left;20 reps;Supine Straight Leg Raises: 15 reps;AAROM;Left;Supine   PT Diagnosis:    PT Problem List:   PT Treatment Interventions:     PT Goals Acute Rehab PT Goals PT Goal: Sit to Stand - Progress: Progressing toward goal PT Goal: Stand to Sit - Progress: Progressing toward goal PT Goal: Ambulate - Progress: Progressing toward goal PT Goal: Up/Down Stairs - Progress: Progressing toward goal PT Goal: Perform Home Exercise Program - Progress: Progressing toward goal  Visit Information  Last PT Received On: 08/27/11 Assistance Needed: +1    Subjective Data  Subjective: "Ok I'm ready to do this (PT) then I'd like to get in the CPM before I leave."   Cognition  Overall Cognitive Status: Appears within functional limits for tasks assessed/performed    Balance     End of Session PT - End of Session Equipment Utilized During Treatment: Left knee immobilizer Activity Tolerance: Patient tolerated treatment well Patient left: in bed;with call bell/phone within reach    [facility] E 08/27/2011, 12:01 PM Pager: [phone]

## 2011-08-27 NOTE — Progress Notes (Signed)
Subjective: 3 Days Post-Op Procedure(s) (LRB): TOTAL KNEE ARTHROPLASTY (Left) Patient reports pain as mild.   Patient seen in rounds with Dr. Lequita Halt. Patient is well, but has had some minor complaints of pain in the knee, requiring pain medications Patient is ready to go home today.  Objective: Vital signs in last 24 hours: Temp:  [98.3 F (36.8 C)-99.8 F (37.7 C)] 99.7 F (37.6 C) (05/09 0445) Pulse Rate:  [76-79] 76  (05/09 0445) Resp:  [16] 16  (05/09 0445) BP: (120-154)/(66-76) 120/71 mmHg (05/09 0445) SpO2:  [92 %-98 %] 92 % (05/09 0445)  Intake/Output from previous day:  Intake/Output Summary (Last 24 hours) at 08/27/11 0914 Last data filed at 08/27/11 0753  Gross per 24 hour  Intake    720 ml  Output    800 ml  Net    -80 ml    Intake/Output this shift: Total I/O In: 360 [P.O.:360] Out: -   Labs:  Basename 08/27/11 0415 08/26/11 0414 08/25/11 0400  HGB 9.6* 9.5* 10.1*    Basename 08/27/11 0415 08/26/11 0414  WBC 9.3 9.9  RBC 3.35* 3.26*  HCT 28.9* 28.2*  PLT 225 213    Basename 08/26/11 0414 08/25/11 0400  NA 140 139  K 3.6 3.7  CL 106 106  CO2 26 24  BUN 17 17  CREATININE 1.00 0.89  GLUCOSE 118* 143*  CALCIUM 8.9 9.1   No results found for this basename: LABPT:2,INR:2 in the last 72 hours  EXAM: General - Patient is Alert, Appropriate and Oriented Extremity - Neurovascular intact Sensation intact distally Incision - clean, dry, no drainage, healing Motor Function - intact, moving foot and toes well on exam.   Assessment/Plan: 3 Days Post-Op Procedure(s) (LRB): TOTAL KNEE ARTHROPLASTY (Left) Procedure(s) (LRB): TOTAL KNEE ARTHROPLASTY (Left) Past Medical History  Diagnosis Date  . Hypertension   . Abnormal heart rhythm   . Hyperlipidemia   . Chronic headaches   . Sleep apnea     on CPAP-uses [employer]  . Second degree Mobitz II AV block     s/p PPM by Dr Reyes Ivan 2010  . Hypercholesteremia   . Obstructive sleep apnea on CPAP   .  Cardiomegaly   . Atypical chest pain   . Osteoarthritis   . Muscle degeneration   . Urinary incontinence   . Dysrhythmia     HX OF MOBITZ 2 AV BLOCK AND FUNCTIONING PACEMAKER -DR. JAMES ALLRED FOLLOWS PT'S PACEMAKER CARE  . Recurrent upper respiratory infection (URI)     MARCH 2013 DX WITH BRONCHITIS--PT STATES RESOLVED--DOES HAVE SOME ALLERGIES AT PRESENT  . Headache     PT STATES HER CHRONIC H/A'S HAVE RESOLVED  . GERD (gastroesophageal reflux disease)    Principal Problem:  *OA (osteoarthritis) of knee Active Problems:  Postop Acute blood loss anemia   Discharge home with home health Diet - Cardiac diet Follow up - in 2 weeks Activity - WBAT Disposition - Home Condition Upon Discharge - Good D/C Meds - See DC Summary DVT Prophylaxis - Xarelto  Paiten Boies 08/27/2011, 9:14 AM

## 2011-08-28 ENCOUNTER — Encounter (HOSPITAL_COMMUNITY): Payer: Self-pay | Admitting: Orthopedic Surgery

## 2011-09-03 NOTE — Discharge Summary (Signed)
Physician Discharge Summary   Patient ID: [name] MRN: [id number] DOB/AGE: [birth year] 77 y.o.  Admit date: 08/24/2011 Discharge date: 08/27/2011  Primary Diagnosis: Osteoarthritis Left Knee   Admission Diagnoses:  Past Medical History  Diagnosis Date  . Hypertension   . Abnormal heart rhythm   . Hyperlipidemia   . Chronic headaches   . Sleep apnea     on CPAP-uses [employer]  . Second degree Mobitz II AV block     s/p PPM by Dr Reyes Ivan 2010  . Hypercholesteremia   . Obstructive sleep apnea on CPAP   . Cardiomegaly   . Atypical chest pain   . Osteoarthritis   . Muscle degeneration   . Urinary incontinence   . Dysrhythmia     HX OF MOBITZ 2 AV BLOCK AND FUNCTIONING PACEMAKER -DR. JAMES ALLRED FOLLOWS PT'S PACEMAKER CARE  . Recurrent upper respiratory infection (URI)     MARCH 2013 DX WITH BRONCHITIS--PT STATES RESOLVED--DOES HAVE SOME ALLERGIES AT PRESENT  . Headache     PT STATES HER CHRONIC H/A'S HAVE RESOLVED  . GERD (gastroesophageal reflux disease)    Discharge Diagnoses:   Principal Problem:  *OA (osteoarthritis) of knee Active Problems:  Postop Acute blood loss anemia  Procedure:  Procedure(s) (LRB): TOTAL KNEE ARTHROPLASTY (Left)   Consults: None  HPI: [name] is a 77 y.o. year old female with end stage OA of her left knee with progressively worsening pain and dysfunction. She has constant pain, with activity and at rest and significant functional deficits with difficulties even with ADLs. She has had extensive non-op management including analgesics, injections of cortisone and viscosupplements, and home exercise program, but remains in significant pain with significant dysfunction. Radiographs show bone on bone arthritis medial and patellofemoral with osteophyte formation. She presents now for left Total Knee Arthroplasty.    Laboratory Data: Hospital Outpatient Visit on 08/17/2011  Component Date Value Range Status  . MRSA, PCR  08/17/2011 NEGATIVE   NEGATIVE Final  . Staphylococcus aureus  08/17/2011 NEGATIVE  NEGATIVE Final   Comment:                                 The Xpert SA Assay (FDA                          approved for NASAL specimens                          only), is one component of                          a comprehensive surveillance                          program.  It is not intended                          to diagnose infection nor to                          guide or monitor treatment.  . WBC (K/uL) 08/17/2011 5.8  4.0-10.5 Final  . RBC (MIL/uL) 08/17/2011 4.43  3.87-5.11 Final  . Hemoglobin (g/dL) 40/98/1191 47.8  29.5-62.1 Final  . HCT (%) 08/17/2011  38.5  36.0-46.0 Final  . MCV (fL) 08/17/2011 86.9  78.0-100.0 Final  . MCH (pg) 08/17/2011 29.1  26.0-34.0 Final  . MCHC (g/dL) 16/01/9603 54.0  98.1-19.1 Final  . RDW (%) 08/17/2011 14.2  11.5-15.5 Final  . Platelets (K/uL) 08/17/2011 312  150-400 Final  . Sodium (mEq/L) 08/17/2011 139  135-145 Final  . Potassium (mEq/L) 08/17/2011 4.1  3.5-5.1 Final  . Chloride (mEq/L) 08/17/2011 102  96-112 Final  . CO2 (mEq/L) 08/17/2011 28  19-32 Final  . Glucose, Bld (mg/dL) 47/82/9562 85  13-08 Final  . BUN (mg/dL) 65/78/4696 23  2-95 Final  . Creatinine, Ser (mg/dL) 28/41/3244 0.10  2.72-5.36 Final  . Calcium (mg/dL) 64/40/3474 9.8  2.5-95.6 Final  . Total Protein (g/dL) 38/75/6433 7.7  2.9-5.1 Final  . Albumin (g/dL) 88/41/6606 4.0  3.0-1.6 Final  . AST (U/L) 08/17/2011 22  0-37 Final  . ALT (U/L) 08/17/2011 9  0-35 Final  . Alkaline Phosphatase (U/L) 08/17/2011 27* 39-117 Final  . Total Bilirubin (mg/dL) 04/28/3233 0.4  5.7-3.2 Final  . GFR calc non Af Amer (mL/min) 08/17/2011 55* >90 Final  . GFR calc Af Amer (mL/min) 08/17/2011 64* >90 Final   Comment:                                 The eGFR has been calculated                          using the CKD EPI equation.                          This calculation has not been                          validated in all  clinical                          situations.                          eGFR's persistently                          <90 mL/min signify                          possible Chronic Kidney Disease.  Marland Kitchen aPTT (seconds) 08/17/2011 32  24-37 Final  . Color, Urine  08/17/2011 YELLOW  YELLOW Final  . APPearance  08/17/2011 CLEAR  CLEAR Final  . Specific Gravity, Urine  08/17/2011 1.010  1.005-1.030 Final  . pH  08/17/2011 6.5  5.0-8.0 Final  . Glucose, UA (mg/dL) 20/25/4270 NEGATIVE  NEGATIVE Final  . Hgb urine dipstick  08/17/2011 NEGATIVE  NEGATIVE Final  . Bilirubin Urine  08/17/2011 NEGATIVE  NEGATIVE Final  . Ketones, ur (mg/dL) 62/37/6283 NEGATIVE  NEGATIVE Final  . Protein, ur (mg/dL) 15/17/6160 NEGATIVE  NEGATIVE Final  . Urobilinogen, UA (mg/dL) 73/71/0626 0.2  9.4-8.5 Final  . Nitrite  08/17/2011 NEGATIVE  NEGATIVE Final  . Leukocytes, UA  08/17/2011 NEGATIVE  NEGATIVE Final   MICROSCOPIC NOT DONE ON URINES WITH NEGATIVE PROTEIN, BLOOD, LEUKOCYTES, NITRITE, OR GLUCOSE <1000 mg/dL.  Marland Kitchen Prothrombin Time (seconds) 08/17/2011 13.8  11.6-15.2 Final  . INR  08/17/2011 1.04  0.00-1.49 Final   No results found for this basename: HGB:5 in the last 72 hours No results found for this basename: WBC:2,RBC:2,HCT:2,PLT:2 in the last 72 hours No results found for this basename: NA:2,K:2,CL:2,CO2:2,BUN:2,CREATININE:2,GLUCOSE:2,CALCIUM:2 in the last 72 hours No results found for this basename: LABPT:2,INR:2 in the last 72 hours  X-Rays:Dg Chest 2 View  08/17/2011  *RADIOLOGY REPORT*  Clinical Data: Preop for total left knee arthroplasty  CHEST - 2 VIEW  Comparison: Chest radiograph 06/07/2008  Findings: Left chest wall dual lead pacemaker is in stable position. Slight cardiomegaly is stable.  Leads project over the expected location of the right atrium and right ventricle. Pulmonary vascularity is normal.  No focal airspace opacities or pleural effusions.  There is atherosclerotic calcification of the  visualized proximal abdominal aorta on the lateral view. Degenerative changes of the thoracic spine.  IMPRESSION: No acute cardiopulmonary disease.  Original Report Authenticated By: Britta Mccreedy, M.D.    EKG: Orders placed in visit on 05/19/11  . EKG 12-LEAD     Hospital Course: Patient was admitted to [facility] and taken to the OR and underwent the above state procedure without complications.  Patient tolerated the procedure well and was later transferred to the recovery room and then to the orthopaedic floor for postoperative care.  They were given PO and IV analgesics for pain control following their surgery.  They were given 24 hours of postoperative antibiotics and started on DVT prophylaxis in the form of Xarelto.   PT and OT were ordered for total joint protocol.  Discharge planning consulted to help with postop disposition and equipment needs.  Patient had a good night on the evening of surgery and started to get up OOB with therapy on day one.  PCA was discontinued and they were weaned over to PO meds.  Hemovac drain was pulled without difficulty.  Continued to work with therapy into day two.  Dressing was changed on day two and the incision was healing well.  By day three, the patient had progressed with therapy and meeting their goals.  Incision was healing well.  Patient was seen in rounds and was ready to go home.  Discharge Medications: Prior to Admission medications   Medication Sig Start Date End Date Taking? Authorizing Provider  amLODipine (NORVASC) 5 MG tablet Take 5 mg by mouth at bedtime.    Yes Historical Provider, MD  atenolol (TENORMIN) 50 MG tablet Take 50 mg by mouth 2 (two) times daily.    Yes Historical Provider, MD  CLINDAMYCIN HCL PO Take 300 mg by mouth as directed. Only for dental procedures   Yes Historical Provider, MD  fluticasone (FLONASE) 50 MCG/ACT nasal spray Place 1 spray into the nose daily as needed. Allergies  08/03/11  Yes Historical Provider,  MD  nystatin-triamcinolone ointment (MYCOLOG) Apply 1 application topically 2 (two) times daily as needed. Itching  08/11/11 08/10/12 Yes Trellis Paganini, MD  omeprazole (PRILOSEC) 20 MG capsule Take 20 mg by mouth daily.    Yes Historical Provider, MD  pseudoephedrine-dextromethorphan-guaifenesin (ROBITUSSIN-PE) 30-10-100 MG/5ML solution Take 10 mLs by mouth 4 (four) times daily as needed. Cough    Yes Historical Provider, MD  simvastatin (ZOCOR) 40 MG tablet Take 40 mg by mouth at bedtime.    Yes Historical Provider, MD  SPIRIVA HANDIHALER 18 MCG inhalation capsule Place 18 mcg into inhaler and inhale daily as needed. Wheezing  07/27/11  Yes Historical Provider, MD  telmisartan-hydrochlorothiazide (MICARDIS HCT) 80-12.5 MG per  tablet Take 1 tablet by mouth daily with breakfast.    Yes Historical Provider, MD  HYDROmorphone (DILAUDID) 2 MG tablet Take 1-2 tablets (2-4 mg total) by mouth every 4 (four) hours as needed. 08/27/11 09/06/11  Emarion Toral, PA  iron polysaccharides (NIFEREX) 150 MG capsule Take 1 capsule (150 mg total) by mouth daily. 08/27/11 08/26/12  Katalyna Socarras, PA  methocarbamol (ROBAXIN) 500 MG tablet Take 1 tablet (500 mg total) by mouth every 6 (six) hours as needed. 08/27/11 09/06/11  Dastan Krider Julien Girt, PA  rivaroxaban (XARELTO) 10 MG TABS tablet Take 1 tablet (10 mg total) by mouth daily with breakfast. Take for two and a half more weeks, then discontinue Xarelto. Patient may resume their 81 mg Aspirin after completing the Xarelto. 08/27/11   Navdeep Fessenden Julien Girt, PA    Diet: Cardiac diet Activity:WBAT Follow-up:in 2 weeks Disposition - Home Discharged Condition: good   Discharge Orders    Future Appointments: Provider: Department: Dept Phone: Center:   09/10/2011 8:00 AM [facility] [phone] [facility]   03/30/2012 9:45 AM Waymon Budge, MD [facility] [phone] None     Future Orders Please Complete By Expires    Diet - low sodium heart healthy      Call MD / Call 911      Comments:   If you experience chest pain or shortness of breath, CALL 911 and be transported to the hospital emergency room.  If you develope a fever above 101 F, pus (white drainage) or increased drainage or redness at the wound, or calf pain, call your surgeon's office.   Discharge instructions      Comments:   Pick up stool softner and laxative for home. Do not submerge incision under water. May shower. Continue to use ice for pain and swelling from surgery.  Take Xarelto for two and a half more weeks, then discontinue Xarelto. Patient may resume their 81 mg Aspirin after completing the Xarelto.   Constipation Prevention      Comments:   Drink plenty of fluids.  Prune juice may be helpful.  You may use a stool softener, such as Colace (over the counter) 100 mg twice a day.  Use MiraLax (over the counter) for constipation as needed.   Increase activity slowly as tolerated      Weight Bearing as taught in Physical Therapy      Comments:   Use a walker or crutches as instructed.   Patient may shower      Comments:   You may shower without a dressing once there is no drainage.  Do not wash over the wound.  If drainage remains, do not shower until drainage stops.   Driving restrictions      Comments:   No driving until released by the physician.   Lifting restrictions      Comments:   No lifting until released by the physician.   TED hose      Comments:   Use stockings (TED hose) for 3 weeks on both leg(s).  You may remove them at night for sleeping.   Change dressing      Comments:   Change dressing daily with sterile 4 x 4 inch gauze dressing and apply TED hose. Do not submerge the incision under water.   Do not put a pillow under the knee. Place it under the heel.      Do not sit on low chairs, stoools or toilet seats, as it may be difficult  to get up from low surfaces      CPM      Comments:   Continuous passive  motion machine (CPM):      Use the CPM from 0 to 40 for 4 hours per day.      You may increase by 5-10 degrees per day.  Break it up into 2 sessions per day 2 hours each.      Use CPM for 2 weeks.     Medication List  As of 09/03/2011 12:45 PM   STOP taking these medications         aspirin 81 MG tablet      [employer] DIGESTIVE PROBIOTIC PO      Fish Oil 1200 MG Caps      MACULAR VITAMIN BENEFIT Tabs      naproxen 500 MG tablet      OVER THE COUNTER MEDICATION         TAKE these medications         amLODipine 5 MG tablet   Commonly known as: NORVASC   Take 5 mg by mouth at bedtime.      atenolol 50 MG tablet   Commonly known as: TENORMIN   Take 50 mg by mouth 2 (two) times daily.      CLINDAMYCIN HCL PO   Take 300 mg by mouth as directed. Only for dental procedures      fluticasone 50 MCG/ACT nasal spray   Commonly known as: FLONASE   Place 1 spray into the nose daily as needed. Allergies        HYDROmorphone 2 MG tablet   Commonly known as: DILAUDID   Take 1-2 tablets (2-4 mg total) by mouth every 4 (four) hours as needed.      iron polysaccharides 150 MG capsule   Commonly known as: NIFEREX   Take 1 capsule (150 mg total) by mouth daily.      methocarbamol 500 MG tablet   Commonly known as: ROBAXIN   Take 1 tablet (500 mg total) by mouth every 6 (six) hours as needed.      nystatin-triamcinolone ointment   Commonly known as: MYCOLOG   Apply 1 application topically 2 (two) times daily as needed. Itching        omeprazole 20 MG capsule   Commonly known as: PRILOSEC   Take 20 mg by mouth daily.      pseudoephedrine-dextromethorphan-guaifenesin 30-10-100 MG/5ML solution   Commonly known as: ROBITUSSIN-PE   Take 10 mLs by mouth 4 (four) times daily as needed. Cough        rivaroxaban 10 MG Tabs tablet   Commonly known as: XARELTO   Take 1 tablet (10 mg total) by mouth daily with breakfast. Take for two and a half more weeks, then discontinue Xarelto.  Patient may resume their 81 mg Aspirin after completing the Xarelto.      simvastatin 40 MG tablet   Commonly known as: ZOCOR   Take 40 mg by mouth at bedtime.      SPIRIVA HANDIHALER 18 MCG inhalation capsule   Generic drug: tiotropium   Place 18 mcg into inhaler and inhale daily as needed. Wheezing        telmisartan-hydrochlorothiazide 80-12.5 MG per tablet   Commonly known as: MICARDIS HCT   Take 1 tablet by mouth daily with breakfast.           Follow-up Information    Follow up with Loanne Drilling, MD. Schedule an appointment as soon  as possible for a visit in 2 weeks.   Contact information:   [facility] [address], [address] [place] [address] [phone]          Signed: Patrica Duel 09/03/2011, 12:45 PM

## 2011-09-10 ENCOUNTER — Ambulatory Visit (INDEPENDENT_AMBULATORY_CARE_PROVIDER_SITE_OTHER): Payer: Medicare Other | Admitting: *Deleted

## 2011-09-10 ENCOUNTER — Encounter: Payer: Self-pay | Admitting: Internal Medicine

## 2011-09-10 DIAGNOSIS — I441 Atrioventricular block, second degree: Secondary | ICD-10-CM

## 2011-09-11 LAB — REMOTE PACEMAKER DEVICE
AL AMPLITUDE: 2.8 mv
BAMS-0001: 175 {beats}/min
RV LEAD AMPLITUDE: 16 mv
RV LEAD THRESHOLD: 0.5 V
VENTRICULAR PACING PM: 28

## 2011-10-02 ENCOUNTER — Encounter: Payer: Self-pay | Admitting: *Deleted

## 2011-11-23 ENCOUNTER — Other Ambulatory Visit: Payer: Self-pay | Admitting: Orthopedic Surgery

## 2011-11-23 ENCOUNTER — Encounter (HOSPITAL_COMMUNITY): Payer: Self-pay

## 2011-11-23 MED ORDER — DEXAMETHASONE SODIUM PHOSPHATE 10 MG/ML IJ SOLN: 10.0000 mg | Freq: Once | INTRAMUSCULAR | Status: DC

## 2011-11-23 NOTE — Progress Notes (Signed)
Preoperative surgical orders have been place into the Epic hospital system for [name] on 11/23/2011, 10:36 PM  by Patrica Duel for surgery on 10/02/11.  Preop Knee orders including IV Tylenol and IV Decadron as long as there are no contraindications to the above medications. Avel Peace, PA-C

## 2011-11-25 NOTE — Patient Instructions (Addendum)
[address] Dymphna Wadley Andon  11/25/2011   Your procedure is scheduled on:  11/30/11 545pm-615pm  Report to [facility] at 315pm  Call this number if you have problems the morning of surgery: [phone]   Remember:   Do not eat food:After Midnight.  May have clear liquids:until 1100am then npo   Clear liquids include soda, tea, black coffee, apple or grape juice, broth.  Take these medicines the morning of surgery with A SIP OF WATER:    Do not wear jewelry, make-up or nail polish.  Do not wear lotions, powders, or perfumes. .  Do not shave 48 hours prior to surgery.  Do not bring valuables to the hospital.  Contacts, dentures or bridgework may not be worn into surgery. .    Patients discharged the day of surgery will not be allowed to drive home.  Name and phone number of your driver:  Special Instructions: CHG Shower Use Special Wash: 1/2 bottle night before surgery and 1/2 bottle morning of surgery. shower chin to toes with CHG.  Wash face and private parts with regular soap.    Please read over the following fact sheets that you were given: MRSA Information, coughing and deep breathing exercises, leg exercises

## 2011-11-26 ENCOUNTER — Encounter (HOSPITAL_COMMUNITY)
Admission: RE | Admit: 2011-11-26 | Discharge: 2011-11-26 | Disposition: A | Discharge status: Home / Self Care | Payer: Medicare Other | Source: Ambulatory Visit | Attending: Orthopedic Surgery | Admitting: Orthopedic Surgery

## 2011-11-26 ENCOUNTER — Encounter (HOSPITAL_COMMUNITY): Payer: Self-pay

## 2011-11-26 HISTORY — DX: Anemia, unspecified: D64.9

## 2011-11-26 HISTORY — DX: Major depressive disorder, single episode, unspecified: F32.9

## 2011-11-26 HISTORY — DX: Depression, unspecified: F32.A

## 2011-11-26 LAB — BASIC METABOLIC PANEL
CO2: 28 mEq/L (ref 19–32)
Chloride: 101 mEq/L (ref 96–112)
GFR calc Af Amer: 71 mL/min — ABNORMAL LOW (ref >90)
Potassium: 4.4 mEq/L (ref 3.5–5.1)
Sodium: 138 mEq/L (ref 135–145)

## 2011-11-26 LAB — CBC
Platelets: 344 10*3/uL (ref 150–400)
RBC: 4.93 MIL/uL (ref 3.87–5.11)
RDW: 14.3 % (ref 11.5–15.5)
WBC: 5.9 10*3/uL (ref 4.0–10.5)

## 2011-11-26 LAB — SURGICAL PCR SCREEN: Staphylococcus aureus: NEGATIVE

## 2011-11-26 NOTE — Progress Notes (Signed)
Last office visit with DR allred 2/04/28/11 - EPIC EKG 08/17/11 EPIC  Pacer Check 09/10/11 EPIC  CXR 08/17/11 in [facility]

## 2011-11-27 NOTE — Progress Notes (Signed)
Called patient on 11/27/11 and she is aware to be here on 11/30/11.  Arrival time of 315pm and surgery time 545pm.

## 2011-11-29 ENCOUNTER — Other Ambulatory Visit: Payer: Self-pay | Admitting: Orthopedic Surgery

## 2011-11-29 NOTE — H&P (Signed)
CC- [name] is a 77 y.o. female who presents with left knee pain.  HPI- . Knee Pain: Patient presents with stiffness involving the  left knee. She had a left TKA on 08/24/11 and has not progressed well with PT with limited flexion of 90 degrees. She has been stuck at this amount of flexion for the past month despite adequate PT. She has much more flexion in her replaced right knee and desires more flexion on the left. She presents for closed manipulation  Past Medical History  Diagnosis Date  . Hypertension   . Abnormal heart rhythm   . Hyperlipidemia   . Chronic headaches   . Sleep apnea     on CPAP-uses [employer]  . Second degree Mobitz II AV block     s/p PPM by Dr Reyes Ivan 2010  . Hypercholesteremia   . Obstructive sleep apnea on CPAP   . Cardiomegaly   . Atypical chest pain   . Osteoarthritis   . Urinary incontinence   . Dysrhythmia     HX OF MOBITZ 2 AV BLOCK AND FUNCTIONING PACEMAKER -DR. JAMES ALLRED FOLLOWS PT'S PACEMAKER CARE  . Recurrent upper respiratory infection (URI)     MARCH 2013 DX WITH BRONCHITIS--PT STATES RESOLVED--DOES HAVE SOME ALLERGIES AT PRESENT  . Headache     PT STATES HER CHRONIC H/A'S HAVE RESOLVED  . GERD (gastroesophageal reflux disease)   . Pacemaker   . Depression   . Anemia     hx of not recent     Past Surgical History  Procedure Date  . Tubal ligation 1968  . Pacemaker insertion 2010    dual-chamber (MDT) implanted by Dr Reyes Ivan  . Cesarean section   . Cesarean section   . Foot surgery   . Knee surgery     Arthroscopic surg  . Hand surgery   . Total knee arthroplasty     right  . Carpal tunnel release   . Total knee arthroplasty 08/24/2011    Procedure: TOTAL KNEE ARTHROPLASTY;  Surgeon: Loanne Drilling, MD;  Location: [facility]S;  Service: Orthopedics;  Laterality: Left;    Prior to Admission medications   Medication Sig Start Date End Date Taking? Authorizing Provider  amLODipine (NORVASC) 5 MG tablet Take 5 mg by mouth at  bedtime.     Historical Provider, MD  aspirin 81 MG tablet Take 81 mg by mouth every evening.    Historical Provider, MD  atenolol (TENORMIN) 50 MG tablet Take 50 mg by mouth 2 (two) times daily.     Historical Provider, MD  CLINDAMYCIN HCL PO Take 300 mg by mouth as directed. Only for dental procedures    Historical Provider, MD  darifenacin (ENABLEX) 7.5 MG 24 hr tablet Take 7.5 mg by mouth every morning.    Historical Provider, MD  nystatin-triamcinolone ointment (MYCOLOG) Apply 1 application topically 2 (two) times daily as needed. Itching 08/11/11 08/10/12  Trellis Paganini, MD  Omega-3 Fatty Acids (FISH OIL) 1200 MG CAPS Take 1,200 mg by mouth every morning.    Historical Provider, MD  omeprazole (PRILOSEC) 20 MG capsule Take 20 mg by mouth every morning.     Historical Provider, MD  OVER THE COUNTER MEDICATION Take 1 tablet by mouth 2 (two) times daily. Macular degeneration supplement    Historical Provider, MD  simvastatin (ZOCOR) 40 MG tablet Take 40 mg by mouth at bedtime.     Historical Provider, MD  SPIRIVA HANDIHALER 18 MCG inhalation capsule Place 18  mcg into inhaler and inhale daily as needed. Wheezing 07/27/11   Historical Provider, MD  telmisartan-hydrochlorothiazide (MICARDIS HCT) 80-12.5 MG per tablet Take 1 tablet by mouth daily with breakfast.     Historical Provider, MD   KNEE EXAM reduced range of motion 5-90 degrees  Physical Examination: General appearance - alert, well appearing, and in no distress Mental status - alert, oriented to person, place, and time Chest - clear to auscultation, no wheezes, rales or rhonchi, symmetric air entry Heart - normal rate, regular rhythm, normal S1, S2, no murmurs, rubs, clicks or gallops Abdomen - soft, nontender, nondistended, no masses or organomegaly Neurological - alert, oriented, normal speech, no focal findings or movement disorder noted   Asessment/Plan--- Left knee arthrofibrosis- - Plan left knee closed manipulation. Procedure  risks and potential comps discussed with patient who elects to proceed. Goals are decreased pain and increased function with a high likelihood of achieving both

## 2011-11-30 ENCOUNTER — Ambulatory Visit (HOSPITAL_COMMUNITY): Payer: Medicare Other | Admitting: Anesthesiology

## 2011-11-30 ENCOUNTER — Encounter (HOSPITAL_COMMUNITY)
Admission: RE | Disposition: A | Discharge status: Home / Self Care | Payer: Self-pay | Source: Ambulatory Visit | Attending: Orthopedic Surgery

## 2011-11-30 ENCOUNTER — Encounter (HOSPITAL_COMMUNITY): Payer: Self-pay | Admitting: Anesthesiology

## 2011-11-30 ENCOUNTER — Encounter (HOSPITAL_COMMUNITY): Payer: Self-pay | Admitting: *Deleted

## 2011-11-30 ENCOUNTER — Ambulatory Visit (HOSPITAL_COMMUNITY)
Admission: RE | Admit: 2011-11-30 | Discharge: 2011-11-30 | Disposition: A | Discharge status: Home / Self Care | Payer: Medicare Other | Source: Ambulatory Visit | Attending: Orthopedic Surgery | Admitting: Orthopedic Surgery

## 2011-11-30 DIAGNOSIS — Z01812 Encounter for preprocedural laboratory examination: Secondary | ICD-10-CM | POA: Insufficient documentation

## 2011-11-30 DIAGNOSIS — I10 Essential (primary) hypertension: Secondary | ICD-10-CM | POA: Insufficient documentation

## 2011-11-30 DIAGNOSIS — K219 Gastro-esophageal reflux disease without esophagitis: Secondary | ICD-10-CM | POA: Insufficient documentation

## 2011-11-30 DIAGNOSIS — T8489XA Other specified complication of internal orthopedic prosthetic devices, implants and grafts, initial encounter: Secondary | ICD-10-CM

## 2011-11-30 DIAGNOSIS — Z950 Presence of cardiac pacemaker: Secondary | ICD-10-CM | POA: Insufficient documentation

## 2011-11-30 DIAGNOSIS — Z96659 Presence of unspecified artificial knee joint: Secondary | ICD-10-CM | POA: Insufficient documentation

## 2011-11-30 DIAGNOSIS — M24669 Ankylosis, unspecified knee: Secondary | ICD-10-CM | POA: Insufficient documentation

## 2011-11-30 DIAGNOSIS — E7800 Pure hypercholesterolemia, unspecified: Secondary | ICD-10-CM | POA: Insufficient documentation

## 2011-11-30 DIAGNOSIS — E785 Hyperlipidemia, unspecified: Secondary | ICD-10-CM | POA: Insufficient documentation

## 2011-11-30 DIAGNOSIS — Z79899 Other long term (current) drug therapy: Secondary | ICD-10-CM | POA: Insufficient documentation

## 2011-11-30 DIAGNOSIS — G4733 Obstructive sleep apnea (adult) (pediatric): Secondary | ICD-10-CM | POA: Insufficient documentation

## 2011-11-30 DIAGNOSIS — M25669 Stiffness of unspecified knee, not elsewhere classified: Secondary | ICD-10-CM | POA: Diagnosis present

## 2011-11-30 HISTORY — PX: KNEE CLOSED REDUCTION: SHX995

## 2011-11-30 SURGERY — MANIPULATION, KNEE, CLOSED
Anesthesia: General | Site: Knee | Laterality: Left | Wound class: Clean

## 2011-11-30 MED ORDER — PROPOFOL 10 MG/ML IV BOLUS
INTRAVENOUS | Status: DC | PRN
  Administered 2011-11-30: 180 mg via INTRAVENOUS

## 2011-11-30 MED ORDER — OXYCODONE HCL 5 MG PO TABS: 5.0000 mg | ORAL_TABLET | Freq: Once | ORAL | Status: DC | PRN

## 2011-11-30 MED ORDER — MIDAZOLAM HCL 5 MG/5ML IJ SOLN
INTRAMUSCULAR | Status: DC | PRN
  Administered 2011-11-30: 2 mg via INTRAVENOUS

## 2011-11-30 MED ORDER — ACETAMINOPHEN 10 MG/ML IV SOLN
INTRAVENOUS | Status: AC
  Filled 2011-11-30: qty 100

## 2011-11-30 MED ORDER — LIDOCAINE HCL (CARDIAC) 20 MG/ML IV SOLN
INTRAVENOUS | Status: DC | PRN
  Administered 2011-11-30: 80 mg via INTRAVENOUS

## 2011-11-30 MED ORDER — HYDROMORPHONE HCL PF 1 MG/ML IJ SOLN: 0.2500 mg | INTRAMUSCULAR | Status: DC | PRN

## 2011-11-30 MED ORDER — ACETAMINOPHEN 10 MG/ML IV SOLN
INTRAVENOUS | Status: DC | PRN
  Administered 2011-11-30: 1000 mg via INTRAVENOUS

## 2011-11-30 MED ORDER — MEPERIDINE HCL 50 MG/ML IJ SOLN: 6.2500 mg | INTRAMUSCULAR | Status: DC | PRN

## 2011-11-30 MED ORDER — SODIUM CHLORIDE 0.9 % IV SOLN: INTRAVENOUS | Status: DC

## 2011-11-30 MED ORDER — ACETAMINOPHEN 10 MG/ML IV SOLN: 1000.0000 mg | Freq: Once | INTRAVENOUS | Status: DC | PRN

## 2011-11-30 MED ORDER — OXYCODONE HCL 5 MG/5ML PO SOLN
5.0000 mg | Freq: Once | ORAL | Status: DC | PRN
  Filled 2011-11-30: qty 5

## 2011-11-30 MED ORDER — PROMETHAZINE HCL 25 MG/ML IJ SOLN: 6.2500 mg | INTRAMUSCULAR | Status: DC | PRN

## 2011-11-30 MED ORDER — CHLORHEXIDINE GLUCONATE 4 % EX LIQD
60.0000 mL | Freq: Once | CUTANEOUS | Status: DC
  Filled 2011-11-30: qty 60

## 2011-11-30 MED ORDER — LACTATED RINGERS IV SOLN
INTRAVENOUS | Status: DC
  Administered 2011-11-30: 1000 mL via INTRAVENOUS

## 2011-11-30 MED ORDER — FENTANYL CITRATE 0.05 MG/ML IJ SOLN
INTRAMUSCULAR | Status: DC | PRN
  Administered 2011-11-30 (×2): 50 ug via INTRAVENOUS

## 2011-11-30 MED ORDER — ACETAMINOPHEN 10 MG/ML IV SOLN: 1000.0000 mg | Freq: Once | INTRAVENOUS | Status: DC

## 2011-11-30 SURGICAL SUPPLY — 12 items
BANDAGE ADHESIVE 1X3 (GAUZE/BANDAGES/DRESSINGS) IMPLANT
CLOTH BEACON ORANGE TIMEOUT ST (SAFETY) ×2 IMPLANT
GLOVE BIO SURGEON STRL SZ7.5 (GLOVE) ×1 IMPLANT
GOWN STRL NON-REIN LRG LVL3 (GOWN DISPOSABLE) ×1 IMPLANT
GOWN STRL REIN XL XLG (GOWN DISPOSABLE) ×1 IMPLANT
MANIFOLD NEPTUNE II (INSTRUMENTS) ×1 IMPLANT
NDL SAFETY ECLIPSE 18X1.5 (NEEDLE) IMPLANT
NEEDLE HYPO 18GX1.5 SHARP (NEEDLE)
POSITIONER SURGICAL ARM (MISCELLANEOUS) ×1 IMPLANT
SPONGE GAUZE 4X4 12PLY (GAUZE/BANDAGES/DRESSINGS) IMPLANT
SYR CONTROL 10ML LL (SYRINGE) IMPLANT
TOWEL OR 17X26 10 PK STRL BLUE (TOWEL DISPOSABLE) ×2 IMPLANT

## 2011-11-30 NOTE — Op Note (Signed)
  OPERATIVE REPORT   PREOPERATIVE DIAGNOSIS: Arthrofibrosis, Left  knee.   POSTOPERATIVE DIAGNOSIS: Arthrofibrosis, Left knee.   PROCEDURE:  Left  knee closed manipulation.   SURGEON: Ollen Gross, MD   ASSISTANT: None.   ANESTHESIA: General.   COMPLICATIONS: None.   CONDITION: Stable to Recovery.   Pre-manipulation range of motion is 5-95.  Post-manipulation range of  Motion is 0130  PROCEDURE IN DETAIL: After successful administration of general  anesthetic, exam under anesthesia was performed showing range of motion  5-95 degrees. I then placed my chest against the proximal tibia,  flexing the knee with audible lysis of adhesions. I was easily able to  get the knee flexed to 130  degrees. I then put the knee back in extension and with some  patellar manipulation and gentle pressure got to full Extension.The patient was subsequently awakened and transported to Recovery in  stable condition.

## 2011-11-30 NOTE — Progress Notes (Signed)
PACU Nursing DC note: Pt fully alert and oriented x 3, mae x 4, follows commands, pt dc instructions reveiwed with husband and patient, pt teaching done (1) activity at home,importance of being safe while ambulating around house, (2) safety and instructions while taking pain medicine (3) appropriate post op diet when arriving home (4) instructions on when to call MD for questions and when to call MD office for follow up appointment, Copy of DC instructions given to husband, husband assisted pt w/ getting dressed, pt teaching done w/ teach back method,with several opportunities for questions provided, pt escorted to POV via w/c with husband at side

## 2011-11-30 NOTE — Anesthesia Preprocedure Evaluation (Signed)
Anesthesia Evaluation  Patient identified by MRN, date of birth, ID band Patient awake    Reviewed: Allergy & Precautions, H&P , NPO status , Patient's Chart, lab work & pertinent test results, reviewed documented beta blocker date and time   Airway Mallampati: II TM Distance: >3 FB Neck ROM: full    Dental No notable dental hx. (+) Teeth Intact and Dental Advisory Given   Pulmonary neg pulmonary ROS, sleep apnea and Continuous Positive Airway Pressure Ventilation ,  breath sounds clear to auscultation  Pulmonary exam normal       Cardiovascular Exercise Tolerance: Good hypertension, Pt. on medications and Pt. on home beta blockers negative cardio ROS  + dysrhythmias + pacemaker Rhythm:regular Rate:Normal  Mobitz 2 AV block   Neuro/Psych  Headaches, negative neurological ROS  negative psych ROS   GI/Hepatic negative GI ROS, Neg liver ROS, GERD-  Medicated and Controlled,  Endo/Other  negative endocrine ROS  Renal/GU negative Renal ROS  negative genitourinary   Musculoskeletal  (+) Arthritis -, Osteoarthritis,    Abdominal (+) + obese,   Peds  Hematology negative hematology ROS (+)   Anesthesia Other Findings   Reproductive/Obstetrics negative OB ROS                           Anesthesia Physical Anesthesia Plan  ASA: II  Anesthesia Plan: General   Post-op Pain Management:    Induction: Intravenous  Airway Management Planned: Mask  Additional Equipment:   Intra-op Plan:   Post-operative Plan:   Informed Consent: I have reviewed the patients History and Physical, chart, labs and discussed the procedure including the risks, benefits and alternatives for the proposed anesthesia with the patient or authorized representative who has indicated his/her understanding and acceptance.   Dental advisory given  Plan Discussed with: CRNA and Surgeon  Anesthesia Plan Comments:          Anesthesia Quick Evaluation

## 2011-11-30 NOTE — Anesthesia Postprocedure Evaluation (Deleted)
Anesthesia Post Note  Patient: [name]  Procedure(s) Performed: Procedure(s) (LRB): CLOSED MANIPULATION KNEE (Left)  Anesthesia type: General  Patient location: PACU  Post pain: Pain level controlled  Post assessment: Post-op Vital signs reviewed  Last Vitals: BP 137/67  Pulse 68  Temp 36.8 C (Oral)  Resp 20  SpO2 97%  Post vital signs: Reviewed  Level of consciousness: sedated  Complications: No apparent anesthesia complications  

## 2011-11-30 NOTE — Transfer of Care (Signed)
Immediate Anesthesia Transfer of Care Note  Patient: [name]  Procedure(s) Performed: Procedure(s) (LRB): CLOSED MANIPULATION KNEE (Left)  Patient Location: PACU  Anesthesia Type: General  Level of Consciousness: awake, alert  and oriented  Airway & Oxygen Therapy: Patient Spontanous Breathing and Patient connected to face mask oxygen  Post-op Assessment: Report given to PACU RN and Post -op Vital signs reviewed and stable  Post vital signs: Reviewed and stable  Complications: No apparent anesthesia complications

## 2011-11-30 NOTE — Interval H&P Note (Signed)
History and Physical Interval Note:  11/30/2011 4:24 PM  [name]  has presented today for surgery, with the diagnosis of Left Knee Arthrofibrosis  The various methods of treatment have been discussed with the patient and family. After consideration of risks, benefits and other options for treatment, the patient has consented to  Procedure(s) (LRB): CLOSED MANIPULATION KNEE (Left) as a surgical intervention .  The patient's history has been reviewed, patient examined, no change in status, stable for surgery.  I have reviewed the patient's chart and labs.  Questions were answered to the patient's satisfaction.     Loanne Drilling

## 2011-11-30 NOTE — Anesthesia Postprocedure Evaluation (Signed)
Anesthesia Post Note  Patient: [name]  Procedure(s) Performed: Procedure(s) (LRB): CLOSED MANIPULATION KNEE (Left)  Anesthesia type: General  Patient location: PACU  Post pain: Pain level controlled  Post assessment: Post-op Vital signs reviewed  Last Vitals: BP 137/67  Pulse 68  Temp 36.8 C (Oral)  Resp 20  SpO2 97%  Post vital signs: Reviewed  Level of consciousness: sedated  Complications: No apparent anesthesia complications

## 2011-12-01 ENCOUNTER — Encounter (HOSPITAL_COMMUNITY): Payer: Self-pay | Admitting: Orthopedic Surgery

## 2011-12-17 ENCOUNTER — Encounter: Payer: Medicare Other | Admitting: *Deleted

## 2011-12-18 ENCOUNTER — Encounter: Payer: Self-pay | Admitting: Internal Medicine

## 2011-12-22 ENCOUNTER — Ambulatory Visit (INDEPENDENT_AMBULATORY_CARE_PROVIDER_SITE_OTHER): Payer: Medicare Other | Admitting: *Deleted

## 2011-12-22 DIAGNOSIS — I441 Atrioventricular block, second degree: Secondary | ICD-10-CM

## 2011-12-24 ENCOUNTER — Encounter: Payer: Self-pay | Admitting: *Deleted

## 2011-12-25 LAB — REMOTE PACEMAKER DEVICE
AL AMPLITUDE: 2.8 mv
BAMS-0001: 175 {beats}/min
RV LEAD AMPLITUDE: 11.2 mv
RV LEAD IMPEDENCE PM: 622 Ohm
RV LEAD THRESHOLD: 0.5 V

## 2012-01-06 ENCOUNTER — Encounter (INDEPENDENT_AMBULATORY_CARE_PROVIDER_SITE_OTHER): Payer: Medicare Other | Admitting: Ophthalmology

## 2012-01-06 DIAGNOSIS — H3530 Unspecified macular degeneration: Secondary | ICD-10-CM

## 2012-01-06 DIAGNOSIS — H2510 Age-related nuclear cataract, unspecified eye: Secondary | ICD-10-CM

## 2012-01-06 DIAGNOSIS — H43819 Vitreous degeneration, unspecified eye: Secondary | ICD-10-CM

## 2012-01-06 DIAGNOSIS — H35379 Puckering of macula, unspecified eye: Secondary | ICD-10-CM

## 2012-01-22 ENCOUNTER — Encounter: Payer: Self-pay | Admitting: *Deleted

## 2012-03-28 ENCOUNTER — Ambulatory Visit (INDEPENDENT_AMBULATORY_CARE_PROVIDER_SITE_OTHER): Payer: Medicare Other | Admitting: *Deleted

## 2012-03-28 DIAGNOSIS — I441 Atrioventricular block, second degree: Secondary | ICD-10-CM

## 2012-03-30 ENCOUNTER — Ambulatory Visit (INDEPENDENT_AMBULATORY_CARE_PROVIDER_SITE_OTHER): Payer: Medicare Other | Admitting: Internal Medicine

## 2012-03-30 ENCOUNTER — Encounter: Payer: Self-pay | Admitting: Internal Medicine

## 2012-03-30 VITALS — BP 130/74 | HR 76 | Ht 61.0 in | Wt 205.4 lb

## 2012-03-30 DIAGNOSIS — J310 Chronic rhinitis: Secondary | ICD-10-CM

## 2012-03-30 DIAGNOSIS — G4733 Obstructive sleep apnea (adult) (pediatric): Secondary | ICD-10-CM

## 2012-03-30 NOTE — Patient Instructions (Addendum)
Sample Qnasl nasal spray      2 puffs each nostril every night at bedtime.  Order DME [facility]  autotitrate CPAP 5-15 cwp x 1 week for pressure recommendation

## 2012-03-30 NOTE — Progress Notes (Signed)
Patient ID: [name], female    DOB: Aug 11, 1934, 77 y.o.   MRN: [id number]  HPI 08/19/10- 77 yoF here with husband on kind referral by Dr Eloise Harman for pulmonary consultation because of cough. Never smoker. Describes onset of cough about 2 years ago. Persistent through most days, without defined aggravating factors. Had seasonal allergic rhinitis mainly in Spring years ago, but denies hx of asthma or pneumonia.  PFT at [facility] 03/18/10 showed mild reversible small  airway obstruction. Has been treated for GERD with dilatation of stricture twice, most recently 2 years ago- she isn't sure if that timing relates to onset of cough. Increasing omeprazole to twice daily may have helped dough. Notes cough on first waking in AM, then through day. Not related to eating. Good and bad days with no pattern. Feels throat tickle then coughs, without any sense of deep chest involvement. Denies nose or sinus discomfort, drainage, etc. Some short of breath with activity, but without wheeze and she attributes it to her level of fitness and obesity. Has OSA/ CPAP- she doesn't think this is the problem. Coughs little at night. CXR 03/03/10- reported mild CE. Pacemaker, spondylosis. No active process.  09/30/10- Cough, GERD, OSA/ CPAP Changed her Spiriva from morning to bedtime. Cough and wheeze were mostly in morning. Wheeze is gone. She notes a little cough, especially with laughter. Cough is much better than before. Spiriva does dry her mouth.  Continues CPAP every night at 12. She is facing TKR right knee.   03/31/11- 77 yoF never smoker followed for Cough, GERD, OSA/ CPAP Has had flu shot. Since last year has had a right total knee replacement without respiratory complication. She stopped Spiriva as unhelpful. She has a pacemaker but denies acute heart problems. She is going to a gym to ride an Administrator, arts. She has had no coughing lately.  Sleep apnea is well controlled using CPAP comfortably at 12 CWP, all night every  night/[facility].  03/30/12- 77 yoF never smoker followed for Cough, GERD, OSA/ CPAP Pt reports has not been using spiriva in "months" ,  Wearing CPAP 12/[facility] approx 7 hrs per night-- questions about still feeling fatigue even w CPAP use (are settings correct), not sleeping as well as she used to. Her cough problem resolved, so she dropped off  Spiriva months ago. She has had bilateral total knee replacement, most recently in May of 2013. She has also had trigger finger surgery. She did take her CPAP for these. Sleep is restless. She wakes up wide awake during the night with dry mouth and nasal stuffiness despite CPAP humidifier. CXR 08/17/11 IMPRESSION:  No acute cardiopulmonary disease.  Original Report Authenticated By: Britta Mccreedy, M.D.   Review of Systems-See HPI Constitutional:   No-   weight loss, night sweats, fevers, chills, +fatigue, lassitude. HEENT:   No-  headaches, difficulty swallowing, tooth/dental problems, sore throat,       No-  sneezing, itching, ear ache, nasal congestion, post nasal drip,  CV:  No-   chest pain, orthopnea, PND, swelling in lower extremities, anasarca, as dizziness, palpitations Resp: No-   shortness of breath with exertion or at rest.              No-   productive cough,  No non-productive cough,  No- coughing up of blood.              No-   change in color of mucus.  No- wheezing.   Skin: No-   rash or  lesions. GI:  No-   heartburn, indigestion, abdominal pain, nausea, vomiting,  GU:  MS:  No-   joint pain or swelling.   Neuro-     nothing unusual Psych:  No- change in mood or affect. No depression or anxiety.  No memory loss.    Objective:   Physical Exam General- Alert, Oriented, Affect-appropriate, Distress- none acute, overweight Skin- rash-none, lesions- none, excoriation- none Lymphadenopathy- none Head- atraumatic            Eyes- Gross vision intact, PERRLA, conjunctivae clear secretions            Ears- Hearing, canals-normal             Nose- Clear, no-Septal dev, mucus, polyps, erosion, perforation             Throat- Mallampati III , mucosa clear , drainage- none, tonsils- atrophic Neck- flexible , trachea midline, no stridor , thyroid nl, carotid no bruit Chest - symmetrical excursion , unlabored           Heart/CV- RRR , no murmur , no gallop  , no rub, nl s1 s2                           - JVD- none , edema- none, stasis changes- none, varices- none           Lung- clear to P&A, wheeze- none, cough- none , dullness-none, rub- none           Chest wall- L pacemaker Abd-  Br/ Gen/ Rectal- Not done, not indicated Extrem- cyanosis- none, clubbing, none, atrophy- none, strength- nl Neuro- grossly intact to observation

## 2012-04-01 ENCOUNTER — Telehealth: Payer: Self-pay | Admitting: Internal Medicine

## 2012-04-01 NOTE — Telephone Encounter (Signed)
Per 12.11.13 ov note w/ CY:  Patient Instructions     Sample Qnasl nasal spray 2 puffs each nostril every night at bedtime.  Order DME [facility] autotitrate CPAP 5-15 cwp x 1 week for pressure recommendation    Called spoke with patient who is requesting clarification on the priming of the Qnasl.  She verified that this was primed for her at ov, but is unsure if priming needs to occur every day prior to use.  Advised pt that no, priming is only ONCE before beginning this medication.  Pt okay with this and verbalized her understanding.  Pt encouraged to call if she has any other questions/concerns.  Nothing further needed; will sign off.

## 2012-04-06 DIAGNOSIS — J310 Chronic rhinitis: Secondary | ICD-10-CM | POA: Insufficient documentation

## 2012-04-06 LAB — REMOTE PACEMAKER DEVICE
AL IMPEDENCE PM: 435 Ohm
AL THRESHOLD: 0.5 V
ATRIAL PACING PM: 27
RV LEAD IMPEDENCE PM: 636 Ohm
RV LEAD THRESHOLD: 0.5 V
VENTRICULAR PACING PM: 39

## 2012-04-06 NOTE — Assessment & Plan Note (Signed)
Compliance has been good but she is not sleeping well. Part of the problem is discomfort from nasal congestion. Plan-auto titrate for pressure recommendation.

## 2012-04-06 NOTE — Assessment & Plan Note (Signed)
She is having increased nasal congestion. This may be  nonallergic. Plan-sample Dymista nasal spray for regular use. See if this improves ability to tolerate CPAP and sleep through the night.

## 2012-04-11 ENCOUNTER — Telehealth: Payer: Self-pay | Admitting: Internal Medicine

## 2012-04-11 DIAGNOSIS — G4733 Obstructive sleep apnea (adult) (pediatric): Secondary | ICD-10-CM

## 2012-04-11 NOTE — Telephone Encounter (Signed)
Per CY-yes okay DME to continue Autopap.

## 2012-04-11 NOTE — Telephone Encounter (Signed)
LAst OV 03/30/12, next OV 05/09/12. I spoke with the pt and she states [facility] set her cpap on auto. She states she took her machine back to them today to get 2 week download. The pt wants to know can she keep her machine set on auto for a little while longer until follow-up? She states she felt very comfortable on this setting and does not want to be changed to a set pressure at this time. Please advise. Carron Curie, CMA DME-[facility]

## 2012-04-11 NOTE — Telephone Encounter (Signed)
Spoke with pt and notified of recs per CDY She verbalized understanding and states nothing further needed Order was sent to [facility]

## 2012-04-15 ENCOUNTER — Encounter: Payer: Self-pay | Admitting: *Deleted

## 2012-05-02 ENCOUNTER — Encounter: Payer: Self-pay | Admitting: Internal Medicine

## 2012-05-09 ENCOUNTER — Encounter: Payer: Self-pay | Admitting: Internal Medicine

## 2012-05-09 ENCOUNTER — Ambulatory Visit (INDEPENDENT_AMBULATORY_CARE_PROVIDER_SITE_OTHER): Payer: Medicare Other | Admitting: Internal Medicine

## 2012-05-09 VITALS — BP 122/70 | HR 79 | Ht 61.0 in | Wt 208.8 lb

## 2012-05-09 DIAGNOSIS — G4733 Obstructive sleep apnea (adult) (pediatric): Secondary | ICD-10-CM

## 2012-05-09 DIAGNOSIS — J310 Chronic rhinitis: Secondary | ICD-10-CM

## 2012-05-09 MED ORDER — AYR SALINE NASAL NA GEL: NASAL | Status: DC

## 2012-05-09 NOTE — Patient Instructions (Addendum)
We can leave CPAP auto 7-15/ [facility]  Sample of otc nasal saline gel

## 2012-05-09 NOTE — Progress Notes (Signed)
Patient ID: [name], female    DOB: 1935-03-18, 78 y.o.   MRN: [id number]  HPI 08/19/10- 60 yoF here with husband on kind referral by Dr Eloise Harman for pulmonary consultation because of cough. Never smoker. Describes onset of cough about 2 years ago. Persistent through most days, without defined aggravating factors. Had seasonal allergic rhinitis mainly in Spring years ago, but denies hx of asthma or pneumonia.  PFT at [facility] 03/18/10 showed mild reversible small  airway obstruction. Has been treated for GERD with dilatation of stricture twice, most recently 2 years ago- she isn't sure if that timing relates to onset of cough. Increasing omeprazole to twice daily may have helped dough. Notes cough on first waking in AM, then through day. Not related to eating. Good and bad days with no pattern. Feels throat tickle then coughs, without any sense of deep chest involvement. Denies nose or sinus discomfort, drainage, etc. Some short of breath with activity, but without wheeze and she attributes it to her level of fitness and obesity. Has OSA/ CPAP- she doesn't think this is the problem. Coughs little at night. CXR 03/03/10- reported mild CE. Pacemaker, spondylosis. No active process.  09/30/10- Cough, GERD, OSA/ CPAP Changed her Spiriva from morning to bedtime. Cough and wheeze were mostly in morning. Wheeze is gone. She notes a little cough, especially with laughter. Cough is much better than before. Spiriva does dry her mouth.  Continues CPAP every night at 12. She is facing TKR right knee.   03/31/11- 76 yoF never smoker followed for Cough, GERD, OSA/ CPAP Has had flu shot. Since last year has had a right total knee replacement without respiratory complication. She stopped Spiriva as unhelpful. She has a pacemaker but denies acute heart problems. She is going to a gym to ride an Administrator, arts. She has had no coughing lately.  Sleep apnea is well controlled using CPAP comfortably at 12 CWP, all night every  night/[facility].  03/30/12- 76 yoF never smoker followed for Cough, GERD, OSA/ CPAP Pt reports has not been using spiriva in "months" ,  Wearing CPAP 12/[facility] approx 7 hrs per night-- questions about still feeling fatigue even w CPAP use (are settings correct), not sleeping as well as she used to. Her cough problem resolved, so she dropped off  Spiriva months ago. She has had bilateral total knee replacement, most recently in May of 2013. She has also had trigger finger surgery. She did take her CPAP for these. Sleep is restless. She wakes up wide awake during the night with dry mouth and nasal stuffiness despite CPAP humidifier. CXR 08/17/11 IMPRESSION:  No acute cardiopulmonary disease.  Original Report Authenticated By: Britta Mccreedy, M.D.    05/09/12-  31 yoF never smoker followed for Cough, GERD, OSA/ CPAP follows for: pt states wears  mask for approx 7 hours a night /7 days weeks. pt is using a mask ..denies any mask or pressure issues.at this time. .<> pt  states  has cough off an on.thinks it may be due to allergies.  CPAP  AutoPap 7-15 /[facility] is comfortable. Download confirms good compliance and control. Fixed pressure of 12 would have been adequate, but she has been left on AutoPap and is satisfied with that. Mentions occasional cough and says she has not been coughing recently and does not concern. Cough was cleared with Spiriva when I first saw her one year ago. She has not felt the need for Spiriva recently.  Review of Systems-See HPI Constitutional:  No-   weight loss, night sweats, fevers, chills, +fatigue, lassitude. HEENT:   No-  headaches, difficulty swallowing, tooth/dental problems, sore throat,       No-  sneezing, itching, ear ache, nasal congestion, post nasal drip,  CV:  No-   chest pain, orthopnea, PND, swelling in lower extremities, anasarca, as dizziness, palpitations Resp: No-   shortness of breath with exertion or at rest.              No-   productive cough,  No  non-productive cough,  No- coughing up of blood.              No-   change in color of mucus.  No- wheezing.   Skin: No-   rash or lesions. GI:  No-   heartburn, indigestion, abdominal pain, nausea, vomiting,  GU:  MS:  No-   joint pain or swelling.   Neuro-     nothing unusual Psych:  No- change in mood or affect. No depression or anxiety.  No memory loss.    Objective:   Physical Exam General- Alert, Oriented, Affect-appropriate, Distress- none acute, overweight Skin- rash-none, lesions- none, excoriation- none Lymphadenopathy- none Head- atraumatic            Eyes- Gross vision intact, PERRLA, conjunctivae clear secretions            Ears- Hearing, canals-normal            Nose- Clear, no-Septal dev, +mucosa is reddend without blood, polyps, erosion, perforation             Throat- Mallampati III , mucosa clear , drainage- none, tonsils- atrophic Neck- flexible , trachea midline, no stridor , thyroid nl, carotid no bruit Chest - symmetrical excursion , unlabored           Heart/CV- RRR , no murmur , no gallop  , no rub, nl s1 s2                           - JVD- none , edema- none, stasis changes- none, varices- none           Lung- clear to P&A, wheeze- none, cough- none , dullness-none, rub- none           Chest wall- L pacemaker Abd-  Br/ Gen/ Rectal- Not done, not indicated Extrem- cyanosis- none, clubbing, none, atrophy- none, strength- nl Neuro- grossly intact to observation

## 2012-05-21 NOTE — Assessment & Plan Note (Signed)
Good compliance and control. She is comfortable with humidifier now

## 2012-05-21 NOTE — Assessment & Plan Note (Signed)
Probably a vasomotor component. Plan-emphasize adequate humidity, saline gel.

## 2012-06-10 ENCOUNTER — Encounter: Payer: Medicare Other | Admitting: Internal Medicine

## 2012-06-22 ENCOUNTER — Encounter: Payer: Self-pay | Admitting: Internal Medicine

## 2012-06-22 ENCOUNTER — Ambulatory Visit (INDEPENDENT_AMBULATORY_CARE_PROVIDER_SITE_OTHER): Payer: Medicare Other | Admitting: Internal Medicine

## 2012-06-22 VITALS — BP 136/68 | HR 82 | Ht 61.0 in | Wt 211.0 lb

## 2012-06-22 DIAGNOSIS — I10 Essential (primary) hypertension: Secondary | ICD-10-CM

## 2012-06-22 DIAGNOSIS — I441 Atrioventricular block, second degree: Secondary | ICD-10-CM

## 2012-06-22 LAB — PACEMAKER DEVICE OBSERVATION
AL IMPEDENCE PM: 435 Ohm
AL THRESHOLD: 1 V
ATRIAL PACING PM: 30
RV LEAD AMPLITUDE: 11.2 mv
RV LEAD IMPEDENCE PM: 650 Ohm
RV LEAD THRESHOLD: 0.5 V

## 2012-06-22 NOTE — Progress Notes (Signed)
PCP: Garlan Fillers, MD  Rudolph Dobler [name] is a 78 y.o. female who presents today for routine electrophysiology followup.  Since last being seen in our clinic, the patient reports doing very well.  Today, she denies symptoms of palpitations, chest pain, shortness of breath,  lower extremity edema, dizziness, presyncope, or syncope.  The patient is otherwise without complaint today.   Past Medical History  Diagnosis Date  . Hypertension   . Abnormal heart rhythm   . Hyperlipidemia   . Chronic headaches   . Sleep apnea     on CPAP-uses [facility]  . Second degree Mobitz II AV block     s/p PPM by Dr Reyes Ivan 2010  . Hypercholesteremia   . Obstructive sleep apnea on CPAP   . Cardiomegaly   . Atypical chest pain   . Osteoarthritis   . Urinary incontinence   . Dysrhythmia     HX OF MOBITZ 2 AV BLOCK AND FUNCTIONING PACEMAKER -DR. JAMES ALLRED FOLLOWS PT'S PACEMAKER CARE  . Recurrent upper respiratory infection (URI)     MARCH 2013 DX WITH BRONCHITIS--PT STATES RESOLVED--DOES HAVE SOME ALLERGIES AT PRESENT  . Headache     PT STATES HER CHRONIC H/A'S HAVE RESOLVED  . GERD (gastroesophageal reflux disease)   . Pacemaker   . Depression   . Anemia     hx of not recent    Past Surgical History  Procedure Laterality Date  . Tubal ligation  1968  . Pacemaker insertion  2010    dual-chamber (MDT) implanted by Dr Reyes Ivan  . Cesarean section    . Cesarean section    . Foot surgery    . Knee surgery      Arthroscopic surg  . Hand surgery    . Total knee arthroplasty      right  . Carpal tunnel release    . Total knee arthroplasty  08/24/2011    Procedure: TOTAL KNEE ARTHROPLASTY;  Surgeon: Loanne Drilling, MD;  Location: [facility];  Service: Orthopedics;  Laterality: Left;  . Knee closed reduction  11/30/2011    Procedure: CLOSED MANIPULATION KNEE;  Surgeon: Loanne Drilling, MD;  Location: [facility];  Service: Orthopedics;  Laterality: Left;    Current Outpatient Prescriptions  Medication Sig  Dispense Refill  . amLODipine (NORVASC) 5 MG tablet Take 5 mg by mouth at bedtime.       Marland Kitchen aspirin 81 MG tablet Take 81 mg by mouth every evening.      Marland Kitchen atenolol (TENORMIN) 50 MG tablet Take 50 mg by mouth 2 (two) times daily.       Marland Kitchen CLINDAMYCIN HCL PO Take 300 mg by mouth as directed. Only for dental procedures      . naproxen sodium (ANAPROX) 220 MG tablet Take 220 mg by mouth 2 (two) times daily as needed.       . nystatin-triamcinolone ointment (MYCOLOG) Apply 1 application topically 2 (two) times daily as needed. Itching      . Omega-3 Fatty Acids (FISH OIL) 1200 MG CAPS Take 1,200 mg by mouth every morning.      Marland Kitchen omeprazole (PRILOSEC) 20 MG capsule Take 20 mg by mouth every morning.       Marland Kitchen OVER THE COUNTER MEDICATION Take 1 tablet by mouth 2 (two) times daily. Macular degeneration supplement      . OXYBUTYNIN CHLORIDE PO Take 1 tablet by mouth daily.      . simvastatin (ZOCOR) 40 MG tablet Take 40 mg by  mouth at bedtime.       Marland Kitchen SPIRIVA HANDIHALER 18 MCG inhalation capsule Place 18 mcg into inhaler and inhale daily as needed. Wheezing      . telmisartan-hydrochlorothiazide (MICARDIS HCT) 80-12.5 MG per tablet Take 1 tablet by mouth daily with breakfast.        No current facility-administered medications for this visit.    Physical Exam: Filed Vitals:   06/22/12 1422  BP: 136/68  Pulse: 82  Height: 5\' 1"  (1.549 m)  Weight: 211 lb (95.709 kg)    GEN- The patient is well appearing, alert and oriented x 3 today.   Head- normocephalic, atraumatic Eyes-  Sclera clear, conjunctiva pink Ears- hearing intact Oropharynx- clear Lungs- Clear to ausculation bilaterally, normal work of breathing Chest- pacemaker pocket is well healed Heart- Regular rate and rhythm, no murmurs, rubs or gallops, PMI not laterally displaced GI- soft, NT, ND, + BS Extremities- no clubbing, cyanosis, or edema  Pacemaker interrogation- reviewed in detail today,  See PACEART report  Assessment and  Plan:  1. Mobitz II second degree AV block Normal pacemaker function See Pace Art report MVP turned on today Will follow clinically  2. HTN Stable No change required today

## 2012-06-22 NOTE — Patient Instructions (Addendum)
Your physician wants you to follow-up in: 12 months with Dr Jacquiline Doe will receive a reminder letter in the mail two months in advance. If you don't receive a letter, please call our office to schedule the follow-up appointment.   Remote monitoring is used to monitor your Pacemaker of ICD from home. This monitoring reduces the number of office visits required to check your device to one time per year. It allows [place] to keep an eye on the functioning of your device to ensure it is working properly. You are scheduled for a device check from home on 09/19/12. You may send your transmission at any time that day. If you have a wireless device, the transmission will be sent automatically. After your physician reviews your transmission, you will receive a postcard with your next transmission date.

## 2012-08-02 ENCOUNTER — Encounter: Payer: Self-pay | Admitting: Gynecology

## 2012-08-15 ENCOUNTER — Encounter: Payer: Self-pay | Admitting: Gynecology

## 2012-08-15 ENCOUNTER — Ambulatory Visit (INDEPENDENT_AMBULATORY_CARE_PROVIDER_SITE_OTHER): Payer: Self-pay | Admitting: Gynecology

## 2012-08-15 VITALS — BP 130/76 | Ht 61.0 in | Wt 203.0 lb

## 2012-08-15 DIAGNOSIS — N3281 Overactive bladder: Secondary | ICD-10-CM

## 2012-08-15 DIAGNOSIS — N9089 Other specified noninflammatory disorders of vulva and perineum: Secondary | ICD-10-CM

## 2012-08-15 DIAGNOSIS — K649 Unspecified hemorrhoids: Secondary | ICD-10-CM

## 2012-08-15 DIAGNOSIS — N907 Vulvar cyst: Secondary | ICD-10-CM

## 2012-08-15 DIAGNOSIS — N318 Other neuromuscular dysfunction of bladder: Secondary | ICD-10-CM

## 2012-08-15 DIAGNOSIS — N952 Postmenopausal atrophic vaginitis: Secondary | ICD-10-CM

## 2012-08-15 MED ORDER — NYSTATIN-TRIAMCINOLONE 100000-0.1 UNIT/GM-% EX OINT: TOPICAL_OINTMENT | Freq: Two times a day (BID) | CUTANEOUS | Status: DC

## 2012-08-15 NOTE — Patient Instructions (Signed)
Followup in one year for annual exam, sooner as needed. 

## 2012-08-15 NOTE — Progress Notes (Signed)
Sumiko Ceasar Depaul [birth year] [id number]        78 y.o.  [id number] for followup exam.  Former patient of Dr. Eda Paschal. Several issues noted below.  Past medical history,surgical history, medications, allergies, family history and social history were all reviewed and documented in the EPIC chart. ROS:  Was performed and pertinent positives and negatives are included in the history.  Exam: Kim assistant Filed Vitals:   08/15/12 1027  BP: 130/76  Height: 5\' 1"  (1.549 m)  Weight: 203 lb (92.08 kg)   General appearance  Normal Skin grossly normal Head/Neck normal with no cervical or supraclavicular adenopathy thyroid normal Lungs  clear Cardiac RR, without RMG Abdominal  soft, nontender, without masses, organomegaly or hernia Breasts  examined lying and sitting without masses, retractions, discharge or axillary adenopathy. Pelvic  Ext/BUS/vagina  general atrophic changes. Small classic-appearing sebaceous cyst lower left labia majora.  Cervix  normal with atrophic appearance  Uterus  axial, normal size, shape and contour, midline and mobile nontender   Adnexa  Without masses or tenderness    Anus and perineum  normal   Rectovaginal  normal sphincter tone without palpated masses or tenderness. Inactive external hemorrhoids   Assessment/Plan:  78 y.o. [id number] female for annual exam.    1. Atrophic vaginal changes/Vulvar pruritus.  uses Mytrex cream intermittently for vulvar itching which seems to take care of the problem. I refilled her times a year with one tube and 3 refills. No other symptoms such as discharge or odor.  Having no other significant menopausal symptoms such as hot flashes or night sweats. 2. Sebaceous cyst left lower labia majora. Has been present for years and is asymptomatic. Classic in appearance. Patient will continue to monitor. Followup if any changes. 3. Detrusor instability.  Uses oxybutynine per Dr. Jarold Motto.  Had been prescribed Enablex by Dr. Eda Paschal apparently  was too expensive. She is having a good response to this and will continue on this. 4. Hemorrhoids. Occasional flares with constipation but overall tolerable. We'll continue to monitor. 5. DEXA reported 2008 normal. Recommend she repeats it this coming year when she sees Dr. Jarold Motto this is done through their office. Increase calcium vitamin D reviewed. 6. Mammography 07/2012. Continued annual mammography. SBE monthly reviewed. 7. Pap smear 2012. No Pap smear done today. No history of abnormal Pap smears. Reviewed current screening guidelines and options to stop screening issues over the age of 62 discussed. Alternative less frequent screening intervals also reviewed and we'll readdress this on an annual basis. 8. Colonoscopy 2005. Repeat at their recommended interval. 9. Health maintenance. No lab work done as it is all done to Dr. Norval Gable office who she will be seeing this July. Followup one year, sooner as needed.    Dara Lords MD, 11:02 AM 08/15/2012

## 2012-09-19 ENCOUNTER — Encounter: Payer: Medicare Other | Admitting: *Deleted

## 2012-09-23 ENCOUNTER — Encounter: Payer: Self-pay | Admitting: *Deleted

## 2012-10-03 ENCOUNTER — Ambulatory Visit (INDEPENDENT_AMBULATORY_CARE_PROVIDER_SITE_OTHER): Payer: Medicare Other | Admitting: *Deleted

## 2012-10-03 DIAGNOSIS — I441 Atrioventricular block, second degree: Secondary | ICD-10-CM

## 2012-10-03 DIAGNOSIS — Z950 Presence of cardiac pacemaker: Secondary | ICD-10-CM

## 2012-10-04 ENCOUNTER — Ambulatory Visit (INDEPENDENT_AMBULATORY_CARE_PROVIDER_SITE_OTHER): Payer: Medicare Other | Admitting: Ophthalmology

## 2012-10-04 DIAGNOSIS — H2510 Age-related nuclear cataract, unspecified eye: Secondary | ICD-10-CM

## 2012-10-04 DIAGNOSIS — H43819 Vitreous degeneration, unspecified eye: Secondary | ICD-10-CM

## 2012-10-04 DIAGNOSIS — I10 Essential (primary) hypertension: Secondary | ICD-10-CM

## 2012-10-04 DIAGNOSIS — H3530 Unspecified macular degeneration: Secondary | ICD-10-CM

## 2012-10-04 DIAGNOSIS — H35039 Hypertensive retinopathy, unspecified eye: Secondary | ICD-10-CM

## 2012-10-04 DIAGNOSIS — H35379 Puckering of macula, unspecified eye: Secondary | ICD-10-CM

## 2012-10-09 LAB — REMOTE PACEMAKER DEVICE
AL IMPEDENCE PM: 441 Ohm
ATRIAL PACING PM: 56
BATTERY VOLTAGE: 2.8 V
RV LEAD THRESHOLD: 0.5 V
VENTRICULAR PACING PM: 0

## 2012-10-17 ENCOUNTER — Encounter: Payer: Self-pay | Admitting: *Deleted

## 2012-11-02 ENCOUNTER — Encounter: Payer: Self-pay | Admitting: Internal Medicine

## 2012-11-07 ENCOUNTER — Ambulatory Visit: Payer: Medicare Other | Admitting: Internal Medicine

## 2012-11-25 ENCOUNTER — Ambulatory Visit (INDEPENDENT_AMBULATORY_CARE_PROVIDER_SITE_OTHER): Payer: Medicare Other | Admitting: Internal Medicine

## 2012-11-25 ENCOUNTER — Encounter: Payer: Self-pay | Admitting: Internal Medicine

## 2012-11-25 VITALS — BP 100/58 | HR 78 | Ht 61.0 in | Wt 212.0 lb

## 2012-11-25 DIAGNOSIS — G4733 Obstructive sleep apnea (adult) (pediatric): Secondary | ICD-10-CM

## 2012-11-25 NOTE — Patient Instructions (Addendum)
We can continue CPAP Auto/ [facility]  For the insomnia issue- It should eventually go away. In the meantime- Avoid bright lights, stressfull issues and excitement right before bedtime Calming things like camomille tea, milk and cheese are fine Melatonin would be fine to try Tylenol, or Tylenol PM could also help if needed  Please call if needed

## 2012-11-25 NOTE — Progress Notes (Signed)
Patient ID: [name], female    DOB: 1934-10-05, 78 y.o.   MRN: [id number]  HPI 08/19/10- 78 yoF here with husband on kind referral by Dr Philip Aspen for pulmonary consultation because of cough. Never smoker. Describes onset of cough about 2 years ago. Persistent through most days, without defined aggravating factors. Had seasonal allergic rhinitis mainly in Spring years ago, but denies hx of asthma or pneumonia.  PFT at [facility] 03/18/10 showed mild reversible small  airway obstruction. Has been treated for GERD with dilatation of stricture twice, most recently 2 years ago- she isn't sure if that timing relates to onset of cough. Increasing omeprazole to twice daily may have helped dough. Notes cough on first waking in AM, then through day. Not related to eating. Good and bad days with no pattern. Feels throat tickle then coughs, without any sense of deep chest involvement. Denies nose or sinus discomfort, drainage, etc. Some short of breath with activity, but without wheeze and she attributes it to her level of fitness and obesity. Has OSA/ CPAP- she doesn't think this is the problem. Coughs little at night. CXR 03/03/10- reported mild CE. Pacemaker, spondylosis. No active process.  09/30/10- Cough, GERD, OSA/ CPAP Changed her Spiriva from morning to bedtime. Cough and wheeze were mostly in morning. Wheeze is gone. She notes a little cough, especially with laughter. Cough is much better than before. Spiriva does dry her mouth.  Continues CPAP every night at 12. She is facing TKR right knee.   03/31/11- 76 yoF never smoker followed for Cough, GERD, OSA/ CPAP Has had flu shot. Since last year has had a right total knee replacement without respiratory complication. She stopped Spiriva as unhelpful. She has a pacemaker but denies acute heart problems. She is going to a gym to ride an Comptroller. She has had no coughing lately.  Sleep apnea is well controlled using CPAP comfortably at 12 CWP, all night every  night/[facility].  03/30/12- 76 yoF never smoker followed for Cough, GERD, OSA/ CPAP Pt reports has not been using spiriva in "months" ,  Wearing CPAP 12/[facility] approx 7 hrs per night-- questions about still feeling fatigue even w CPAP use (are settings correct), not sleeping as well as she used to. Her cough problem resolved, so she dropped off  Spiriva months ago. She has had bilateral total knee replacement, most recently in May of 2013. She has also had trigger finger surgery. She did take her CPAP for these. Sleep is restless. She wakes up wide awake during the night with dry mouth and nasal stuffiness despite CPAP humidifier. CXR 08/17/11 IMPRESSION:  No acute cardiopulmonary disease.  Original Report Authenticated By: Curlene Dolphin, M.D.    05/09/12-  67 yoF never smoker followed for Cough, GERD, OSA/ CPAP follows for: pt states wears  mask for approx 7 hours a night /7 days weeks. pt is using a mask ..denies any mask or pressure issues.at this time. .<> pt  states  has cough off an on.thinks it may be due to allergies.  CPAP  AutoPap 7-15 /[facility] is comfortable. Download confirms good compliance and control. Fixed pressure of 12 would have been adequate, but she has been left on AutoPap and is satisfied with that. Mentions occasional cough and says she has not been coughing recently and does not concern. Cough was cleared with Spiriva when I first saw her one year ago. She has not felt the need for Spiriva recently.  11/25/12- 66 yoF never smoker followed for  Cough, GERD, OSA/ CPAP wearing cpap/ Auto/ [facility] every night.  Doesn't sleep well some nights.  No problems with mask or pressure.   Recent sleep latency one and a half hours, then will get up and read for an hour before going back to bed.  Review of Systems-See HPI Constitutional:   No-   weight loss, night sweats, fevers, chills, +fatigue, lassitude. HEENT:   No-  headaches, difficulty swallowing, tooth/dental problems, sore throat,        No-  sneezing, itching, ear ache, nasal congestion, post nasal drip,  CV:  No-   chest pain, orthopnea, PND, swelling in lower extremities, anasarca, as dizziness, palpitations Resp: No-   shortness of breath with exertion or at rest.              No-   productive cough,  No non-productive cough,  No- coughing up of blood.              No-   change in color of mucus.  No- wheezing.   Skin: No-   rash or lesions. GI:  No-   heartburn, indigestion, abdominal pain, nausea, vomiting,  GU:  MS:  No-   joint pain or swelling.   Neuro-     nothing unusual Psych:  No- change in mood or affect. No depression or anxiety.  No memory loss.    Objective:   Physical Exam General- Alert, Oriented, Affect-appropriate, Distress- none acute, overweight Skin- rash-none, lesions- none, excoriation- none Lymphadenopathy- none Head- atraumatic            Eyes- Gross vision intact, PERRLA, conjunctivae clear secretions            Ears- Hearing, canals-normal            Nose- Clear, no-Septal dev, +mucosa is reddend without blood, polyps, erosion, perforation             Throat- Mallampati III-IV , mucosa clear , drainage- none, tonsils- atrophic Neck- flexible , trachea midline, no stridor , thyroid nl, carotid no bruit Chest - symmetrical excursion , unlabored           Heart/CV- RRR , no murmur , no gallop  , no rub, nl s1 s2                           - JVD- none , edema- none, stasis changes- none, varices- none           Lung- clear to P&A, wheeze- none, cough- none , dullness-none, rub- none           Chest wall- L pacemaker Abd-  Br/ Gen/ Rectal- Not done, not indicated Extrem- cyanosis- none, clubbing, none, atrophy- none, strength- nl Neuro- grossly intact to observation

## 2012-12-07 ENCOUNTER — Encounter: Payer: Self-pay | Admitting: Internal Medicine

## 2012-12-07 NOTE — Assessment & Plan Note (Signed)
Pressure setting and mask seem appropriate. Problem is nonspecific insomnia Plan-discussed sleep hygiene, Tylenol PM and melatonin

## 2013-01-09 ENCOUNTER — Ambulatory Visit (INDEPENDENT_AMBULATORY_CARE_PROVIDER_SITE_OTHER): Payer: Medicare Other | Admitting: *Deleted

## 2013-01-09 DIAGNOSIS — Z950 Presence of cardiac pacemaker: Secondary | ICD-10-CM

## 2013-01-09 DIAGNOSIS — I441 Atrioventricular block, second degree: Secondary | ICD-10-CM

## 2013-01-14 LAB — REMOTE PACEMAKER DEVICE
AL IMPEDENCE PM: 435 Ohm
ATRIAL PACING PM: 54
BATTERY VOLTAGE: 2.8 V

## 2013-01-17 ENCOUNTER — Encounter: Payer: Self-pay | Admitting: *Deleted

## 2013-01-24 ENCOUNTER — Encounter: Payer: Self-pay | Admitting: Internal Medicine

## 2013-03-30 ENCOUNTER — Ambulatory Visit (INDEPENDENT_AMBULATORY_CARE_PROVIDER_SITE_OTHER): Payer: Medicare Other | Admitting: Podiatry

## 2013-03-30 ENCOUNTER — Encounter: Payer: Self-pay | Admitting: Podiatry

## 2013-03-30 VITALS — BP 142/69 | HR 88 | Resp 12 | Ht 61.0 in | Wt 210.0 lb

## 2013-03-30 DIAGNOSIS — M779 Enthesopathy, unspecified: Secondary | ICD-10-CM

## 2013-03-30 MED ORDER — TRIAMCINOLONE ACETONIDE 10 MG/ML IJ SUSP
10.0000 mg | Freq: Once | INTRAMUSCULAR | Status: AC
  Administered 2013-03-30: 10 mg

## 2013-03-30 NOTE — Progress Notes (Signed)
Subjective:     Patient ID: [name], female   DOB: 25-May-1934, 78 y.o.   MRN: [id number]  HPI patient states my right fifth metatarsal has become very sore again. States he was better for over a year and is gradually reoccurring   Review of Systems     Objective:   Physical Exam Neurovascular status intact with no health history changes noted. Patient has an inflamed fifth MPJ with fluid around the joint and pain with pressure    Assessment:     Capsulitis right fifth MPJ     Plan:     Explained nature of condition and today did a plantar capsule injection 3 mg Kenalog 5 mg Xylocaine Marcaine mixture to shrink inflammation reappoint as needed

## 2013-04-10 ENCOUNTER — Ambulatory Visit (INDEPENDENT_AMBULATORY_CARE_PROVIDER_SITE_OTHER): Payer: Medicare Other | Admitting: *Deleted

## 2013-04-10 DIAGNOSIS — I441 Atrioventricular block, second degree: Secondary | ICD-10-CM

## 2013-04-11 LAB — MDC_IDC_ENUM_SESS_TYPE_REMOTE
Battery Remaining Longevity: 121 mo
Battery Voltage: 2.8 V
Brady Statistic AP VP Percent: 0 %
Brady Statistic AP VS Percent: 53 %
Brady Statistic AS VP Percent: 0 %
Brady Statistic AS VS Percent: 47 %
Date Time Interrogation Session: 20141223023338
Lead Channel Impedance Value: 408 Ohm
Lead Channel Pacing Threshold Amplitude: 0.5 V
Lead Channel Pacing Threshold Amplitude: 0.625 V
Lead Channel Pacing Threshold Pulse Width: 0.4 ms
Lead Channel Pacing Threshold Pulse Width: 0.4 ms
Lead Channel Sensing Intrinsic Amplitude: 16 mV
Lead Channel Sensing Intrinsic Amplitude: 2.8 mV
Lead Channel Setting Pacing Amplitude: 2.5 V
Lead Channel Setting Pacing Pulse Width: 0.4 ms
Lead Channel Setting Sensing Sensitivity: 2.8 mV
MDC IDC MSMT BATTERY IMPEDANCE: 222 Ohm
MDC IDC MSMT LEADCHNL RV IMPEDANCE VALUE: 565 Ohm
MDC IDC SET LEADCHNL RA PACING AMPLITUDE: 2 V

## 2013-05-02 ENCOUNTER — Encounter: Payer: Self-pay | Admitting: *Deleted

## 2013-05-04 ENCOUNTER — Encounter: Payer: Self-pay | Admitting: Internal Medicine

## 2013-06-16 ENCOUNTER — Encounter: Payer: Self-pay | Admitting: Internal Medicine

## 2013-06-16 ENCOUNTER — Ambulatory Visit (INDEPENDENT_AMBULATORY_CARE_PROVIDER_SITE_OTHER): Payer: Medicare Other | Admitting: Internal Medicine

## 2013-06-16 VITALS — BP 122/74 | HR 87 | Ht 61.0 in | Wt 221.2 lb

## 2013-06-16 DIAGNOSIS — I10 Essential (primary) hypertension: Secondary | ICD-10-CM

## 2013-06-16 DIAGNOSIS — I441 Atrioventricular block, second degree: Secondary | ICD-10-CM

## 2013-06-16 LAB — MDC_IDC_ENUM_SESS_TYPE_INCLINIC
Battery Impedance: 245 Ohm
Battery Remaining Longevity: 118 mo
Battery Voltage: 2.8 V
Brady Statistic AP VP Percent: 0 %
Brady Statistic AS VS Percent: 46 %
Lead Channel Impedance Value: 414 Ohm
Lead Channel Pacing Threshold Pulse Width: 0.4 ms
Lead Channel Sensing Intrinsic Amplitude: 11.2 mV
Lead Channel Setting Pacing Amplitude: 2 V
Lead Channel Setting Pacing Amplitude: 2.5 V
Lead Channel Setting Pacing Pulse Width: 0.4 ms
Lead Channel Setting Sensing Sensitivity: 2.8 mV
MDC IDC MSMT LEADCHNL RA PACING THRESHOLD AMPLITUDE: 0.75 V
MDC IDC MSMT LEADCHNL RA SENSING INTR AMPL: 2 mV
MDC IDC MSMT LEADCHNL RV IMPEDANCE VALUE: 613 Ohm
MDC IDC SESS DTM: 20150227162337
MDC IDC STAT BRADY AP VS PERCENT: 54 %
MDC IDC STAT BRADY AS VP PERCENT: 0 %

## 2013-06-16 LAB — BASIC METABOLIC PANEL
BUN: 25 mg/dL — ABNORMAL HIGH (ref 6–23)
CO2: 28 mEq/L (ref 19–32)
Calcium: 9.7 mg/dL (ref 8.4–10.5)
Chloride: 105 mEq/L (ref 96–112)
Creatinine, Ser: 1.2 mg/dL (ref 0.4–1.2)
GFR: 47.69 mL/min — ABNORMAL LOW (ref >60.00)
GLUCOSE: 114 mg/dL — AB (ref 70–99)
POTASSIUM: 4.1 meq/L (ref 3.5–5.1)
SODIUM: 138 meq/L (ref 135–145)

## 2013-06-16 LAB — MAGNESIUM: Magnesium: 2.1 mg/dL (ref 1.5–2.5)

## 2013-06-16 LAB — TSH: TSH: 2.21 u[IU]/mL (ref 0.35–5.50)

## 2013-06-16 NOTE — Patient Instructions (Signed)
Your physician wants you to follow-up in: 12 months with Dr Vallery Ridge will receive a reminder letter in the mail two months in advance. If you don't receive a letter, please call our office to schedule the follow-up appointment.  Remote monitoring is used to monitor your Pacemaker or ICD from home. This monitoring reduces the number of office visits required to check your device to one time per year. It allows [place] to keep an eye on the functioning of your device to ensure it is working properly. You are scheduled for a device check from home on 09/18/13 You may send your transmission at any time that day. If you have a wireless device, the transmission will be sent automatically. After your physician reviews1 your transmission, you will receive a postcard with your next transmission date.

## 2013-06-19 NOTE — Progress Notes (Signed)
PCP: Donnajean Lopes, MD  [name] is a 79 y.o. female who presents today for routine electrophysiology followup.  Since last being seen in our clinic, the patient reports doing very well.  Today, she denies symptoms of palpitations, chest pain, shortness of breath,  lower extremity edema, dizziness, presyncope, or syncope.  The patient is otherwise without complaint today.   Past Medical History  Diagnosis Date  . Hypertension   . Abnormal heart rhythm   . Hyperlipidemia   . Chronic headaches   . Sleep apnea     on CPAP-uses [facility]  . Second degree Mobitz II AV block     s/p PPM by Dr Verlon Setting 2010  . Hypercholesteremia   . Obstructive sleep apnea on CPAP   . Cardiomegaly   . Atypical chest pain   . Osteoarthritis   . Urinary incontinence   . Dysrhythmia     HX OF MOBITZ 2 AV BLOCK AND FUNCTIONING PACEMAKER -DR. Croix Presley FOLLOWS PT'S PACEMAKER CARE  . Recurrent upper respiratory infection (URI)     MARCH 2013 DX WITH BRONCHITIS--PT STATES RESOLVED--DOES HAVE SOME ALLERGIES AT PRESENT  . Headache(784.0)     PT STATES HER CHRONIC H/A'S HAVE RESOLVED  . GERD (gastroesophageal reflux disease)   . Pacemaker   . Depression   . Anemia     hx of not recent    Past Surgical History  Procedure Laterality Date  . Tubal ligation  1968  . Pacemaker insertion  2010    dual-chamber (MDT) implanted by Dr Verlon Setting  . Cesarean section    . Cesarean section    . Foot surgery    . Knee surgery      Arthroscopic surg  . Hand surgery    . Total knee arthroplasty      right  . Carpal tunnel release    . Total knee arthroplasty  08/24/2011    Procedure: TOTAL KNEE ARTHROPLASTY;  Surgeon: Gearlean Alf, MD;  Location: [facility];  Service: Orthopedics;  Laterality: Left;  . Knee closed reduction  11/30/2011    Procedure: CLOSED MANIPULATION KNEE;  Surgeon: Gearlean Alf, MD;  Location: [facility];  Service: Orthopedics;  Laterality: Left;    Current Outpatient Prescriptions  Medication  Sig Dispense Refill  . amLODipine (NORVASC) 5 MG tablet Take 5 mg by mouth at bedtime.       Marland Kitchen aspirin 81 MG tablet Take 81 mg by mouth every evening.      Marland Kitchen atenolol (TENORMIN) 50 MG tablet Take 50 mg by mouth 2 (two) times daily.       Marland Kitchen CLINDAMYCIN HCL PO Take 300 mg by mouth as directed. Only for dental procedures      . nystatin-triamcinolone ointment (MYCOLOG) Apply topically 2 (two) times daily.  30 g  3  . Omega-3 Fatty Acids (FISH OIL) 1200 MG CAPS Take 1,200 mg by mouth every morning.      Marland Kitchen omeprazole (PRILOSEC) 20 MG capsule Take 20 mg by mouth every morning.       Marland Kitchen OVER THE COUNTER MEDICATION Take 1 tablet by mouth 2 (two) times daily. Macular degeneration supplement      . OXYBUTYNIN CHLORIDE PO Take 1 tablet by mouth daily.      . simvastatin (ZOCOR) 40 MG tablet Take 40 mg by mouth at bedtime.       Marland Kitchen SPIRIVA HANDIHALER 18 MCG inhalation capsule Place 18 mcg into inhaler and inhale daily as needed. Wheezing      .  telmisartan-hydrochlorothiazide (MICARDIS HCT) 80-12.5 MG per tablet Take 1 tablet by mouth daily with breakfast.        No current facility-administered medications for this visit.    Physical Exam: Filed Vitals:   06/16/13 1109  BP: 122/74  Pulse: 87  Height: 5\' 1"  (1.549 m)  Weight: 221 lb 3.2 oz (100.336 kg)    GEN- The patient is well appearing, alert and oriented x 3 today.   Head- normocephalic, atraumatic Eyes-  Sclera clear, conjunctiva pink Ears- hearing intact Oropharynx- clear Lungs- Clear to ausculation bilaterally, normal work of breathing Chest- pacemaker pocket is well healed Heart- Regular rate and rhythm, no murmurs, rubs or gallops, PMI not laterally displaced GI- soft, NT, ND, + BS Extremities- no clubbing, cyanosis, or edema  Pacemaker interrogation- reviewed in detail today,  See PACEART report  Assessment and Plan:  1. Mobitz II second degree AV block Normal pacemaker function See Pace Art report MVP turned on today Will  follow clinically  2. HTN Stable No change required today  [facility] Return to see me in 1 year

## 2013-07-06 ENCOUNTER — Ambulatory Visit (INDEPENDENT_AMBULATORY_CARE_PROVIDER_SITE_OTHER): Payer: Medicare Other | Admitting: Ophthalmology

## 2013-07-06 DIAGNOSIS — I10 Essential (primary) hypertension: Secondary | ICD-10-CM

## 2013-07-06 DIAGNOSIS — H2510 Age-related nuclear cataract, unspecified eye: Secondary | ICD-10-CM

## 2013-07-06 DIAGNOSIS — H3530 Unspecified macular degeneration: Secondary | ICD-10-CM

## 2013-07-06 DIAGNOSIS — H35039 Hypertensive retinopathy, unspecified eye: Secondary | ICD-10-CM

## 2013-07-06 DIAGNOSIS — H43819 Vitreous degeneration, unspecified eye: Secondary | ICD-10-CM

## 2013-07-06 DIAGNOSIS — H35379 Puckering of macula, unspecified eye: Secondary | ICD-10-CM

## 2013-08-27 ENCOUNTER — Encounter (HOSPITAL_COMMUNITY): Payer: Self-pay | Admitting: Emergency Medicine

## 2013-08-27 ENCOUNTER — Emergency Department (HOSPITAL_COMMUNITY)
Admission: EM | Admit: 2013-08-27 | Discharge: 2013-08-27 | Disposition: A | Discharge status: Home / Self Care | Payer: Medicare Other | Attending: Emergency Medicine | Admitting: Emergency Medicine

## 2013-08-27 ENCOUNTER — Emergency Department (HOSPITAL_COMMUNITY): Payer: Medicare Other

## 2013-08-27 ENCOUNTER — Emergency Department (INDEPENDENT_AMBULATORY_CARE_PROVIDER_SITE_OTHER)
Admission: EM | Admit: 2013-08-27 | Discharge: 2013-08-27 | Disposition: A | Discharge status: Nursing Home (Includes ICF) | Payer: Medicare Other | Source: Home / Self Care | Attending: Family Medicine | Admitting: Family Medicine

## 2013-08-27 DIAGNOSIS — E785 Hyperlipidemia, unspecified: Secondary | ICD-10-CM | POA: Insufficient documentation

## 2013-08-27 DIAGNOSIS — Z8709 Personal history of other diseases of the respiratory system: Secondary | ICD-10-CM | POA: Insufficient documentation

## 2013-08-27 DIAGNOSIS — I10 Essential (primary) hypertension: Secondary | ICD-10-CM | POA: Insufficient documentation

## 2013-08-27 DIAGNOSIS — Z862 Personal history of diseases of the blood and blood-forming organs and certain disorders involving the immune mechanism: Secondary | ICD-10-CM | POA: Insufficient documentation

## 2013-08-27 DIAGNOSIS — Z9981 Dependence on supplemental oxygen: Secondary | ICD-10-CM | POA: Insufficient documentation

## 2013-08-27 DIAGNOSIS — R42 Dizziness and giddiness: Secondary | ICD-10-CM | POA: Insufficient documentation

## 2013-08-27 DIAGNOSIS — G4733 Obstructive sleep apnea (adult) (pediatric): Secondary | ICD-10-CM | POA: Insufficient documentation

## 2013-08-27 DIAGNOSIS — K219 Gastro-esophageal reflux disease without esophagitis: Secondary | ICD-10-CM | POA: Insufficient documentation

## 2013-08-27 DIAGNOSIS — Z880 Allergy status to penicillin: Secondary | ICD-10-CM | POA: Insufficient documentation

## 2013-08-27 DIAGNOSIS — Z79899 Other long term (current) drug therapy: Secondary | ICD-10-CM | POA: Insufficient documentation

## 2013-08-27 DIAGNOSIS — Z8659 Personal history of other mental and behavioral disorders: Secondary | ICD-10-CM | POA: Insufficient documentation

## 2013-08-27 DIAGNOSIS — Z950 Presence of cardiac pacemaker: Secondary | ICD-10-CM | POA: Insufficient documentation

## 2013-08-27 DIAGNOSIS — Z7982 Long term (current) use of aspirin: Secondary | ICD-10-CM | POA: Insufficient documentation

## 2013-08-27 DIAGNOSIS — G8929 Other chronic pain: Secondary | ICD-10-CM | POA: Insufficient documentation

## 2013-08-27 DIAGNOSIS — E7800 Pure hypercholesterolemia, unspecified: Secondary | ICD-10-CM | POA: Insufficient documentation

## 2013-08-27 DIAGNOSIS — M1990 Unspecified osteoarthritis, unspecified site: Secondary | ICD-10-CM | POA: Insufficient documentation

## 2013-08-27 LAB — BASIC METABOLIC PANEL
BUN: 22 mg/dL (ref 6–23)
CALCIUM: 9.2 mg/dL (ref 8.4–10.5)
CO2: 23 meq/L (ref 19–32)
CREATININE: 1.05 mg/dL (ref 0.50–1.10)
Chloride: 105 mEq/L (ref 96–112)
GFR calc Af Amer: 58 mL/min — ABNORMAL LOW (ref >90)
GFR calc non Af Amer: 50 mL/min — ABNORMAL LOW (ref >90)
Glucose, Bld: 102 mg/dL — ABNORMAL HIGH (ref 70–99)
Potassium: 4.1 mEq/L (ref 3.7–5.3)
Sodium: 141 mEq/L (ref 137–147)

## 2013-08-27 LAB — CBC WITH DIFFERENTIAL/PLATELET
BASOS ABS: 0.1 10*3/uL (ref 0.0–0.1)
Basophils Relative: 1 % (ref 0–1)
EOS PCT: 5 % (ref 0–5)
Eosinophils Absolute: 0.2 10*3/uL (ref 0.0–0.7)
HEMATOCRIT: 35.7 % — AB (ref 36.0–46.0)
Hemoglobin: 11.5 g/dL — ABNORMAL LOW (ref 12.0–15.0)
Lymphocytes Relative: 37 % (ref 12–46)
Lymphs Abs: 1.6 10*3/uL (ref 0.7–4.0)
MCH: 26.6 pg (ref 26.0–34.0)
MCHC: 32.2 g/dL (ref 30.0–36.0)
MCV: 82.4 fL (ref 78.0–100.0)
MONO ABS: 0.5 10*3/uL (ref 0.1–1.0)
Monocytes Relative: 12 % (ref 3–12)
Neutro Abs: 2.1 10*3/uL (ref 1.7–7.7)
Neutrophils Relative %: 45 % (ref 43–77)
Platelets: 247 10*3/uL (ref 150–400)
RBC: 4.33 MIL/uL (ref 3.87–5.11)
RDW: 16 % — AB (ref 11.5–15.5)
WBC: 4.5 10*3/uL (ref 4.0–10.5)

## 2013-08-27 LAB — URINALYSIS, ROUTINE W REFLEX MICROSCOPIC
BILIRUBIN URINE: NEGATIVE
GLUCOSE, UA: NEGATIVE mg/dL
HGB URINE DIPSTICK: NEGATIVE
Ketones, ur: NEGATIVE mg/dL
Leukocytes, UA: NEGATIVE
Nitrite: NEGATIVE
PH: 5 (ref 5.0–8.0)
Protein, ur: NEGATIVE mg/dL
Specific Gravity, Urine: 1.013 (ref 1.005–1.030)
Urobilinogen, UA: 0.2 mg/dL (ref 0.0–1.0)

## 2013-08-27 LAB — TROPONIN I: Troponin I: <0.3 ng/mL (ref <0.30)

## 2013-08-27 MED ORDER — SODIUM CHLORIDE 0.9 % IV SOLN
Freq: Once | INTRAVENOUS | Status: AC
  Administered 2013-08-27: 10:00:00 via INTRAVENOUS

## 2013-08-27 MED ORDER — SODIUM CHLORIDE 0.9 % IV SOLN
INTRAVENOUS | Status: DC
  Administered 2013-08-27: 12:00:00 via INTRAVENOUS

## 2013-08-27 NOTE — ED Notes (Signed)
Pt. states she has had intermitant episodes of dizziness this week.  Sometimes associated with a H/A.  No nausea or sweating.  Has also had R ant. Chest pain off and on this week. Pt. has a pacemaker and is a paced rhythm.

## 2013-08-27 NOTE — Discharge Instructions (Signed)

## 2013-08-27 NOTE — ED Provider Notes (Addendum)
CSN: [id number]     Arrival date & time 08/27/13  1047 History   First MD Initiated Contact with Patient 08/27/13 1111     Chief Complaint  Patient presents with  . Dizziness     (Consider location/radiation/quality/duration/timing/severity/associated sxs/prior Treatment) Patient is a 79 y.o. female presenting with dizziness. The history is provided by the patient and a relative.  Dizziness  She has had episodic, dizziness, described as lightheadedness, for one week. The episodes last several hours, then resolve for several hours. When the tenderness is present, it occurs when she stands up. At other times when, the lightheadedness is not better, she is able to ambulate easily. She relates a problem to starting while doing yard work, this week. No near syncope or syncope. He has had very brief episodes of intermittent right-sided chest pain that lasted about 5 minutes, and are self-limited. She has not had any chest pain today. She denies fever, chills, cough, shortness of breath. Isolated, weakness, or paresthesia. She's taking her usual medications, without relief. She was seen earlier today, at the urgent care Center, and sent here for further evaluation. There are no other known modifying factors.  Past Medical History  Diagnosis Date  . Hypertension   . Abnormal heart rhythm   . Hyperlipidemia   . Chronic headaches   . Sleep apnea     on CPAP-uses [facility]  . Second degree Mobitz II AV block     s/p PPM by Dr Verlon Setting 2010  . Hypercholesteremia   . Obstructive sleep apnea on CPAP   . Cardiomegaly   . Atypical chest pain   . Osteoarthritis   . Urinary incontinence   . Dysrhythmia     HX OF MOBITZ 2 AV BLOCK AND FUNCTIONING PACEMAKER -DR. JAMES ALLRED FOLLOWS PT'S PACEMAKER CARE  . Recurrent upper respiratory infection (URI)     MARCH 2013 DX WITH BRONCHITIS--PT STATES RESOLVED--DOES HAVE SOME ALLERGIES AT PRESENT  . Headache(784.0)     PT STATES HER CHRONIC H/A'S HAVE RESOLVED  .  GERD (gastroesophageal reflux disease)   . Pacemaker   . Depression   . Anemia     hx of not recent    Past Surgical History  Procedure Laterality Date  . Tubal ligation  1968  . Pacemaker insertion  2010    dual-chamber (MDT) implanted by Dr Verlon Setting  . Cesarean section    . Cesarean section    . Foot surgery    . Knee surgery      Arthroscopic surg  . Hand surgery    . Total knee arthroplasty      right  . Carpal tunnel release    . Total knee arthroplasty  08/24/2011    Procedure: TOTAL KNEE ARTHROPLASTY;  Surgeon: Gearlean Alf, MD;  Location: [facility];  Service: Orthopedics;  Laterality: Left;  . Knee closed reduction  11/30/2011    Procedure: CLOSED MANIPULATION KNEE;  Surgeon: Gearlean Alf, MD;  Location: [facility];  Service: Orthopedics;  Laterality: Left;   Family History  Problem Relation Age of Onset  . Emphysema Father   . Allergies Father   . Heart disease Father   . Hypertension Father   . Heart disease Mother   . Diabetes Mother   . Hypertension Mother   . Breast cancer Mother     Age 18   History  Substance Use Topics  . Smoking status: Never Smoker   . Smokeless tobacco: Never Used  . Alcohol Use: No  OB History   Grav Para Term Preterm Abortions TAB SAB Ect Mult Living   4 3 3  1     3      Review of Systems  Neurological: Positive for dizziness.      Allergies  Amoxicillin and Codeine  Home Medications   Prior to Admission medications   Medication Sig Start Date End Date Taking? Authorizing Provider  amLODipine (NORVASC) 5 MG tablet Take 5 mg by mouth at bedtime.    Yes Historical Provider, MD  aspirin 81 MG tablet Take 81 mg by mouth every evening.   Yes Historical Provider, MD  atenolol (TENORMIN) 50 MG tablet Take 50 mg by mouth 2 (two) times daily.    Yes Historical Provider, MD  NAPROXEN PO Take 1 tablet by mouth 2 (two) times daily as needed (pain).   Yes Historical Provider, MD  nystatin-triamcinolone ointment (MYCOLOG) Apply 1  application topically daily as needed (itching).   Yes Historical Provider, MD  Omega-3 Fatty Acids (FISH OIL) 1200 MG CAPS Take 1,200 mg by mouth every morning.   Yes Historical Provider, MD  omeprazole (PRILOSEC) 20 MG capsule Take 20 mg by mouth every morning.    Yes Historical Provider, MD  OVER THE COUNTER MEDICATION Take 1 tablet by mouth 2 (two) times daily. Macular degeneration supplement   Yes Historical Provider, MD  OXYBUTYNIN CHLORIDE PO Take 1 tablet by mouth daily.   Yes Historical Provider, MD  simvastatin (ZOCOR) 40 MG tablet Take 40 mg by mouth at bedtime.    Yes Historical Provider, MD  telmisartan-hydrochlorothiazide (MICARDIS HCT) 80-12.5 MG per tablet Take 1 tablet by mouth daily with breakfast.    Yes Historical Provider, MD   BP 130/75  Pulse 64  Temp(Src) 98 F (36.7 C) (Oral)  Resp 21  SpO2 94% Physical Exam  Nursing note and vitals reviewed. Constitutional: She is oriented to person, place, and time. She appears well-developed and well-nourished.  HENT:  Head: Normocephalic and atraumatic.  Eyes: Conjunctivae and EOM are normal. Pupils are equal, round, and reactive to light.  Neck: Normal range of motion and phonation normal. Neck supple.  Cardiovascular: Normal rate, regular rhythm and intact distal pulses.   Pulmonary/Chest: Effort normal and breath sounds normal. She exhibits no tenderness.  Abdominal: Soft. She exhibits no distension. There is no tenderness. There is no guarding.  Musculoskeletal: Normal range of motion.  Neurological: She is alert and oriented to person, place, and time. She exhibits normal muscle tone.  Skin: Skin is warm and dry.  Psychiatric: She has a normal mood and affect. Her behavior is normal. Judgment and thought content normal.    ED Course  Procedures (including critical care time) Medications - No data to display  No data found.   Cardiac pacemaker interrogation- [employer];  no events were recorded       Labs  Review Labs Reviewed  CBC WITH DIFFERENTIAL - Abnormal; Notable for the following:    Hemoglobin 11.5 (*)    HCT 35.7 (*)    RDW 16.0 (*)    All other components within normal limits  BASIC METABOLIC PANEL - Abnormal; Notable for the following:    Glucose, Bld 102 (*)    GFR calc non Af Amer 50 (*)    GFR calc Af Amer 58 (*)    All other components within normal limits  URINE CULTURE  TROPONIN I  URINALYSIS, ROUTINE W REFLEX MICROSCOPIC    Imaging Review No results found.  EKG Interpretation   Date/Time:  Sunday Aug 27 2013 14:12:59 EDT Ventricular Rate:  72 PR Interval:  117 QRS Duration: 157 QT Interval:  464 QTC Calculation: 508 R Axis:   46 Text Interpretation:  Age not entered, assumed to be  79 years old for  purpose of ECG interpretation Atrial-paced rhythm Nonspecific  intraventricular conduction delay Anterior infarct, old ED PHYSICIAN  INTERPRETATION AVAILABLE IN [facility] Confirmed by TEST, Record  ([id number]) on 08/29/2013 9:33:34 AM      MDM   Final diagnoses:  Dizziness    Nonspecific dizziness.  Doubt ACS, pacemaker dysfunction, metabolic instability, or severe bacterial infection.   Nursing Notes Reviewed/ Care Coordinated Applicable Imaging Reviewed Interpretation of Laboratory Data incorporated into ED treatment  The patient appears reasonably screened and/or stabilized for discharge and I doubt any other medical condition or other [facility] requiring further screening, evaluation, or treatment in the ED at this time prior to discharge.  Plan: Home Medications- usual; Home Treatments- rest; return here if the recommended treatment, does not improve the symptoms; Recommended follow up- PCP prn    Richarda Blade, MD 08/29/13 5300  Richarda Blade, MD 09/11/13 [phone]

## 2013-08-27 NOTE — ED Notes (Signed)
[facility] here.  Report given to RN. Hanley Seamen Romanda Turrubiates 08/27/2013

## 2013-08-27 NOTE — ED Provider Notes (Signed)
[name] is a 79 y.o. female who presents to Urgent Care today for dizzy. Patient has had episodes of dizziness for the past week. She describes a feeling of presyncope. She denies vertigo. She denies any numbness or weakness loss of function difficulty swallowing or speaking. She describes palpitations occurring during the episodes of dizziness occasionally. Additionally she's had a few episodes of mild right-sided chest pain that occurred with and without dizziness. Additionally she measured her blood pressure during the dizzy spell and was found to be 101/50. She currently is dizzy and without chest pain or palpitations. The symptoms worsened this morning while at church. She has not changed any of her medications recently or their doses. She denies any fevers chills nausea vomiting or diarrhea.   Past Medical History  Diagnosis Date  . Hypertension   . Abnormal heart rhythm   . Hyperlipidemia   . Chronic headaches   . Sleep apnea     on CPAP-uses [facility]  . Second degree Mobitz II AV block     s/p PPM by Dr Verlon Setting 2010  . Hypercholesteremia   . Obstructive sleep apnea on CPAP   . Cardiomegaly   . Atypical chest pain   . Osteoarthritis   . Urinary incontinence   . Dysrhythmia     HX OF MOBITZ 2 AV BLOCK AND FUNCTIONING PACEMAKER -DR. JAMES ALLRED FOLLOWS PT'S PACEMAKER CARE  . Recurrent upper respiratory infection (URI)     MARCH 2013 DX WITH BRONCHITIS--PT STATES RESOLVED--DOES HAVE SOME ALLERGIES AT PRESENT  . Headache(784.0)     PT STATES HER CHRONIC H/A'S HAVE RESOLVED  . GERD (gastroesophageal reflux disease)   . Pacemaker   . Depression   . Anemia     hx of not recent    History  Substance Use Topics  . Smoking status: Never Smoker   . Smokeless tobacco: Never Used  . Alcohol Use: No   ROS as above Medications: No current facility-administered medications for this encounter.   Current Outpatient Prescriptions  Medication Sig Dispense Refill  . amLODipine  (NORVASC) 5 MG tablet Take 5 mg by mouth at bedtime.       Marland Kitchen aspirin 81 MG tablet Take 81 mg by mouth every evening.      Marland Kitchen atenolol (TENORMIN) 50 MG tablet Take 50 mg by mouth 2 (two) times daily.       Marland Kitchen CLINDAMYCIN HCL PO Take 300 mg by mouth as directed. Only for dental procedures      . nystatin-triamcinolone ointment (MYCOLOG) Apply topically 2 (two) times daily.  30 g  3  . Omega-3 Fatty Acids (FISH OIL) 1200 MG CAPS Take 1,200 mg by mouth every morning.      Marland Kitchen omeprazole (PRILOSEC) 20 MG capsule Take 20 mg by mouth every morning.       Marland Kitchen OVER THE COUNTER MEDICATION Take 1 tablet by mouth 2 (two) times daily. Macular degeneration supplement      . OXYBUTYNIN CHLORIDE PO Take 1 tablet by mouth daily.      . simvastatin (ZOCOR) 40 MG tablet Take 40 mg by mouth at bedtime.       Marland Kitchen SPIRIVA HANDIHALER 18 MCG inhalation capsule Place 18 mcg into inhaler and inhale daily as needed. Wheezing      . telmisartan-hydrochlorothiazide (MICARDIS HCT) 80-12.5 MG per tablet Take 1 tablet by mouth daily with breakfast.         Exam:  BP 152/63  Pulse 68  Temp(Src) 98.2  F (36.8 C) (Oral)  Resp 18  SpO2 98% Gen: Well NAD HEENT: EOMI,  MMM PERRLA. Lungs: Normal work of breathing. CTABL Heart: RRR no MRG Abd: NABS, Soft. NT, ND Exts: Brisk capillary refill, warm and well perfused.  Neuro: Alert and oriented cranial nerves II through XII are intact normal sensation strength and reflexes throughout.  Twelve-lead EKG shows AV paced rhythm at around 61 beats per minute.   No results found for this or any previous visit (from the past 24 hour(s)). No results found.  Assessment and Plan: 79 y.o. female with dizziness.  Concerning for possible pacemaker dysfunction versus acute coronary syndrome versus orthostatic hypotension.  Plan to transfer to the emergency room via EMS for evaluation and management.   Discussed warning signs or symptoms. Please see discharge instructions. Patient expresses  understanding.    Gregor Hams, MD 08/27/13 1004

## 2013-08-27 NOTE — ED Notes (Signed)
Patient reports she feels dizzy onset this morning. Did not fall. Patient is alert and oriented and in no acute distress.

## 2013-08-27 NOTE — ED Notes (Signed)
Pt from UC via [facility] with c/o lightheaded and headache intermittently since this past Sunday.  Pt also reports intermitted right sided chest pain that last for a few mintues associated with the other symptoms.  Denies N/V.  Pt in NAD, A&O.

## 2013-08-28 ENCOUNTER — Telehealth: Payer: Self-pay | Admitting: Internal Medicine

## 2013-08-28 LAB — URINE CULTURE: Colony Count: 70000

## 2013-08-28 NOTE — Telephone Encounter (Signed)
Spoke with patient who states she feels "okay" today; states she has not been active today.  Patient states last week while working in the yard she had several dizzy spells.  Patient states yesterday in church she stood up and felt light-headed and like she was going to faint.  Patient states she does not know whether to describe it as dizziness or light-headedness but the room does not spin when these episodes occur and they are most often caused by change of position.  Patient states a nurse at her church advised her to have her pacemaker evaluated.  Patient went to the urgent care center and was transferred to [facility] ER.  Patient had her pacemaker interrogated and there were no problems reported.  Patient states she has felt fine today, but she has been resting today.  Patient states she has hx of hemorrhoids and experienced some constipation last week and had one episode of right breast pain last week that went away when she took a deep breath.  Patient denies SOB or chest discomfort.  I discussed hydration with patient and she admits that she does not drink enough water.  I advised patient to drink 64 oz of water daily and to monitor whether or not the episodes continue to occur when she does this.  I advised patient that there are multiple causes of dizziness or light-headedness and that patient should follow up with PCP unless she notices SOB, chest discomfort, low or high blood pressure or heart rate that is below or above normal range of 60-80.  Patient reports she is taking medications as directed.  I advised patient to call back with worsening symptoms and that we can schedule a nurse visit to check orthostatic vital signs if symptoms persist.  Patient is aware of remote pacer check scheduled for June 1 and will comply with this appointment unless she feels that she needs to see [place] before.

## 2013-08-28 NOTE — Telephone Encounter (Signed)
New message    Patient calling    Yesterday in church- dizzy, head felt funny, like she was going to past out- [facility] - [facility].     EKG was done, pacer remote check.  . Was advise to call the office today.

## 2013-09-18 ENCOUNTER — Ambulatory Visit (INDEPENDENT_AMBULATORY_CARE_PROVIDER_SITE_OTHER): Payer: Medicare Other | Admitting: *Deleted

## 2013-09-18 ENCOUNTER — Encounter: Payer: Self-pay | Admitting: Internal Medicine

## 2013-09-18 ENCOUNTER — Telehealth: Payer: Self-pay | Admitting: Cardiology

## 2013-09-18 DIAGNOSIS — I441 Atrioventricular block, second degree: Secondary | ICD-10-CM

## 2013-09-18 NOTE — Telephone Encounter (Signed)
Spoke with pt and reminded pt of remote transmission that is due today. Pt verbalized understanding.   

## 2013-09-18 NOTE — Progress Notes (Signed)
Remote pacemaker transmission.   

## 2013-09-19 LAB — MDC_IDC_ENUM_SESS_TYPE_REMOTE
Battery Impedance: 245 Ohm
Battery Remaining Longevity: 112 mo
Brady Statistic AP VP Percent: 20 %
Brady Statistic AP VS Percent: 36 %
Brady Statistic AS VS Percent: 31 %
Date Time Interrogation Session: 20150601162535
Lead Channel Impedance Value: 402 Ohm
Lead Channel Impedance Value: 649 Ohm
Lead Channel Pacing Threshold Amplitude: 0.5 V
Lead Channel Pacing Threshold Pulse Width: 0.4 ms
Lead Channel Pacing Threshold Pulse Width: 0.4 ms
Lead Channel Sensing Intrinsic Amplitude: 8 mV
Lead Channel Setting Pacing Amplitude: 2 V
Lead Channel Setting Pacing Amplitude: 2.5 V
Lead Channel Setting Sensing Sensitivity: 2.8 mV
MDC IDC MSMT BATTERY VOLTAGE: 2.79 V
MDC IDC MSMT LEADCHNL RA PACING THRESHOLD AMPLITUDE: 0.625 V
MDC IDC MSMT LEADCHNL RA SENSING INTR AMPL: 1.4 mV
MDC IDC SET LEADCHNL RV PACING PULSEWIDTH: 0.4 ms
MDC IDC STAT BRADY AS VP PERCENT: 12 %

## 2013-09-29 ENCOUNTER — Encounter: Payer: Self-pay | Admitting: Cardiology

## 2013-12-01 ENCOUNTER — Ambulatory Visit: Payer: Medicare Other | Admitting: Internal Medicine

## 2013-12-18 DIAGNOSIS — I441 Atrioventricular block, second degree: Secondary | ICD-10-CM

## 2013-12-20 ENCOUNTER — Ambulatory Visit (INDEPENDENT_AMBULATORY_CARE_PROVIDER_SITE_OTHER): Payer: Medicare Other | Admitting: *Deleted

## 2013-12-20 DIAGNOSIS — I441 Atrioventricular block, second degree: Secondary | ICD-10-CM

## 2013-12-20 LAB — MDC_IDC_ENUM_SESS_TYPE_REMOTE
Battery Impedance: 292 Ohm
Battery Remaining Longevity: 101 mo
Battery Voltage: 2.79 V
Brady Statistic AP VS Percent: 18 %
Date Time Interrogation Session: 20150831112505
Lead Channel Impedance Value: 418 Ohm
Lead Channel Pacing Threshold Pulse Width: 0.4 ms
Lead Channel Pacing Threshold Pulse Width: 0.4 ms
Lead Channel Sensing Intrinsic Amplitude: 1 mV
Lead Channel Setting Pacing Amplitude: 2.5 V
Lead Channel Setting Sensing Sensitivity: 2.8 mV
MDC IDC MSMT LEADCHNL RA PACING THRESHOLD AMPLITUDE: 0.625 V
MDC IDC MSMT LEADCHNL RV IMPEDANCE VALUE: 618 Ohm
MDC IDC MSMT LEADCHNL RV PACING THRESHOLD AMPLITUDE: 0.5 V
MDC IDC MSMT LEADCHNL RV SENSING INTR AMPL: 8 mV
MDC IDC SET LEADCHNL RA PACING AMPLITUDE: 2 V
MDC IDC SET LEADCHNL RV PACING PULSEWIDTH: 0.4 ms
MDC IDC STAT BRADY AP VP PERCENT: 37 %
MDC IDC STAT BRADY AS VP PERCENT: 28 %
MDC IDC STAT BRADY AS VS PERCENT: 16 %

## 2013-12-20 NOTE — Progress Notes (Signed)
Remote pacemaker transmission.   

## 2013-12-27 ENCOUNTER — Encounter: Payer: Self-pay | Admitting: Cardiology

## 2014-01-18 ENCOUNTER — Encounter: Payer: Self-pay | Admitting: Internal Medicine

## 2014-02-19 ENCOUNTER — Encounter (HOSPITAL_COMMUNITY): Payer: Self-pay | Admitting: Emergency Medicine

## 2014-03-21 ENCOUNTER — Telehealth: Payer: Self-pay | Admitting: Internal Medicine

## 2014-03-21 NOTE — Telephone Encounter (Signed)
Error.[name] ° °

## 2014-03-22 ENCOUNTER — Ambulatory Visit (INDEPENDENT_AMBULATORY_CARE_PROVIDER_SITE_OTHER): Payer: Medicare Other | Admitting: *Deleted

## 2014-03-22 DIAGNOSIS — Z950 Presence of cardiac pacemaker: Secondary | ICD-10-CM

## 2014-03-23 ENCOUNTER — Telehealth: Payer: Self-pay | Admitting: Cardiology

## 2014-03-23 DIAGNOSIS — Z950 Presence of cardiac pacemaker: Secondary | ICD-10-CM

## 2014-03-23 DIAGNOSIS — I441 Atrioventricular block, second degree: Secondary | ICD-10-CM

## 2014-03-23 NOTE — Telephone Encounter (Signed)
Confirmed remote transmission w/ pt husband.   

## 2014-03-26 ENCOUNTER — Encounter: Payer: Self-pay | Admitting: Internal Medicine

## 2014-03-26 ENCOUNTER — Ambulatory Visit (INDEPENDENT_AMBULATORY_CARE_PROVIDER_SITE_OTHER): Payer: Medicare Other | Admitting: Internal Medicine

## 2014-03-26 VITALS — BP 138/82 | HR 72 | Ht 61.0 in | Wt 218.0 lb

## 2014-03-26 DIAGNOSIS — G4733 Obstructive sleep apnea (adult) (pediatric): Secondary | ICD-10-CM

## 2014-03-26 DIAGNOSIS — R05 Cough: Secondary | ICD-10-CM

## 2014-03-26 DIAGNOSIS — R053 Chronic cough: Secondary | ICD-10-CM

## 2014-03-26 NOTE — Progress Notes (Signed)
Patient ID: [name], female    DOB: 1934-10-05, 79 y.o.   MRN: [id number]  HPI 08/19/10- 78 yoF here with husband on kind referral by Dr Philip Aspen for pulmonary consultation because of cough. Never smoker. Describes onset of cough about 2 years ago. Persistent through most days, without defined aggravating factors. Had seasonal allergic rhinitis mainly in Spring years ago, but denies hx of asthma or pneumonia.  PFT at [facility] 03/18/10 showed mild reversible small  airway obstruction. Has been treated for GERD with dilatation of stricture twice, most recently 2 years ago- she isn't sure if that timing relates to onset of cough. Increasing omeprazole to twice daily may have helped dough. Notes cough on first waking in AM, then through day. Not related to eating. Good and bad days with no pattern. Feels throat tickle then coughs, without any sense of deep chest involvement. Denies nose or sinus discomfort, drainage, etc. Some short of breath with activity, but without wheeze and she attributes it to her level of fitness and obesity. Has OSA/ CPAP- she doesn't think this is the problem. Coughs little at night. CXR 03/03/10- reported mild CE. Pacemaker, spondylosis. No active process.  09/30/10- Cough, GERD, OSA/ CPAP Changed her Spiriva from morning to bedtime. Cough and wheeze were mostly in morning. Wheeze is gone. She notes a little cough, especially with laughter. Cough is much better than before. Spiriva does dry her mouth.  Continues CPAP every night at 12. She is facing TKR right knee.   03/31/11- 76 yoF never smoker followed for Cough, GERD, OSA/ CPAP Has had flu shot. Since last year has had a right total knee replacement without respiratory complication. She stopped Spiriva as unhelpful. She has a pacemaker but denies acute heart problems. She is going to a gym to ride an Comptroller. She has had no coughing lately.  Sleep apnea is well controlled using CPAP comfortably at 12 CWP, all night every  night/[facility].  03/30/12- 76 yoF never smoker followed for Cough, GERD, OSA/ CPAP Pt reports has not been using spiriva in "months" ,  Wearing CPAP 12/[facility] approx 7 hrs per night-- questions about still feeling fatigue even w CPAP use (are settings correct), not sleeping as well as she used to. Her cough problem resolved, so she dropped off  Spiriva months ago. She has had bilateral total knee replacement, most recently in May of 2013. She has also had trigger finger surgery. She did take her CPAP for these. Sleep is restless. She wakes up wide awake during the night with dry mouth and nasal stuffiness despite CPAP humidifier. CXR 08/17/11 IMPRESSION:  No acute cardiopulmonary disease.  Original Report Authenticated By: Curlene Dolphin, M.D.    05/09/12-  67 yoF never smoker followed for Cough, GERD, OSA/ CPAP follows for: pt states wears  mask for approx 7 hours a night /7 days weeks. pt is using a mask ..denies any mask or pressure issues.at this time. .<> pt  states  has cough off an on.thinks it may be due to allergies.  CPAP  AutoPap 7-15 /[facility] is comfortable. Download confirms good compliance and control. Fixed pressure of 12 would have been adequate, but she has been left on AutoPap and is satisfied with that. Mentions occasional cough and says she has not been coughing recently and does not concern. Cough was cleared with Spiriva when I first saw her one year ago. She has not felt the need for Spiriva recently.  11/25/12- 66 yoF never smoker followed for  Cough, GERD, OSA/ CPAP wearing cpap/ Auto/ [facility] every night.  Doesn't sleep well some nights.  No problems with mask or pressure.   Recent sleep latency one and a half hours, then will get up and read for an hour before going back to bed.  03/26/14- 78 yoF never smoker followed for Cough, GERD, OSA/ CPAP wearing cpap/ Auto 7-15/ [facility] every night. FOLLOW FOR:  OSA; wearing CPAP Auto [facility] every night; got a new mask last week, hasn't  started using it yet, likes her old mask, but they stopped manufacturing that one.  patient thinks her cough is due to allergies. Using CPAP auto 7-15/[facility] all night every night. Doesn't want sleep without it.Says she is due for a new machine. Button may be getting worn.  Review of Systems-See HPI Constitutional:   No-   weight loss, night sweats, fevers, chills, +fatigue, lassitude. HEENT:   No-  headaches, difficulty swallowing, tooth/dental problems, sore throat,       No-  sneezing, itching, ear ache, nasal congestion, post nasal drip,  CV:  No-   chest pain, orthopnea, PND, swelling in lower extremities, anasarca, as dizziness, palpitations Resp: No-   shortness of breath with exertion or at rest.              No-   productive cough,  + non-productive cough,  No- coughing up of blood.              No-   change in color of mucus.  No- wheezing.   Skin: No-   rash or lesions. GI:  No-   heartburn, indigestion, abdominal pain, nausea, vomiting,  GU:  MS:  No-   joint pain or swelling.   Neuro-     nothing unusual Psych:  No- change in mood or affect. No depression or anxiety.  No memory loss.    Objective:   Physical Exam General- Alert, Oriented, Affect-appropriate, Distress- none acute, +overweight Skin- rash-none, lesions- none, excoriation- none Lymphadenopathy- none Head- atraumatic            Eyes- Gross vision intact, PERRLA, conjunctivae clear secretions            Ears- Hearing, canals-normal            Nose- Clear, no-Septal dev, mucosa clear polyps, erosion, perforation             Throat- Mallampati III-IV , mucosa clear , drainage- none, tonsils- atrophic Neck- flexible , trachea midline, no stridor , thyroid nl, carotid no bruit Chest - symmetrical excursion , unlabored           Heart/CV- RRR , no murmur , no gallop  , no rub, nl s1 s2                           - JVD- none , edema- none, stasis changes- none, varices- none           Lung- clear to P&A, wheeze- none,  cough- none , dullness-none, rub- none           Chest wall- L pacemaker Abd-  Br/ Gen/ Rectal- Not done, not indicated Extrem- cyanosis- none, clubbing, none, atrophy- none, strength- nl Neuro- grossly intact to observation

## 2014-03-26 NOTE — Progress Notes (Signed)
Remote pacemaker transmission.   

## 2014-03-26 NOTE — Patient Instructions (Signed)
Order- DME [facility]-Script printed for replacement CPAP machine auto 7-15 cwp, supplies, mask of choice   Dx OSA  Order- download CPAP for pressure compliance  Ok to try using Spiriva on the days you need it. See if it seems to help the cough.

## 2014-03-31 LAB — MDC_IDC_ENUM_SESS_TYPE_REMOTE
Battery Impedance: 316 Ohm
Battery Remaining Longevity: 99 mo
Battery Voltage: 2.79 V
Brady Statistic AP VP Percent: 41 %
Brady Statistic AS VP Percent: 34 %
Date Time Interrogation Session: 20151204214823
Lead Channel Pacing Threshold Amplitude: 0.5 V
Lead Channel Pacing Threshold Pulse Width: 0.4 ms
Lead Channel Sensing Intrinsic Amplitude: 16 mV
Lead Channel Setting Sensing Sensitivity: 2.8 mV
MDC IDC MSMT LEADCHNL RA IMPEDANCE VALUE: 429 Ohm
MDC IDC MSMT LEADCHNL RA SENSING INTR AMPL: 2.8 mV
MDC IDC MSMT LEADCHNL RV IMPEDANCE VALUE: 678 Ohm
MDC IDC MSMT LEADCHNL RV PACING THRESHOLD AMPLITUDE: 0.5 V
MDC IDC MSMT LEADCHNL RV PACING THRESHOLD PULSEWIDTH: 0.4 ms
MDC IDC SET LEADCHNL RA PACING AMPLITUDE: 2 V
MDC IDC SET LEADCHNL RV PACING AMPLITUDE: 2.5 V
MDC IDC SET LEADCHNL RV PACING PULSEWIDTH: 0.4 ms
MDC IDC STAT BRADY AP VS PERCENT: 14 %
MDC IDC STAT BRADY AS VS PERCENT: 11 %

## 2014-04-03 ENCOUNTER — Telehealth: Payer: Self-pay | Admitting: Internal Medicine

## 2014-04-03 NOTE — Assessment & Plan Note (Signed)
This may be at least partly a mild chronic bronchitis.. Plan-use Spiriva on and off, one week at a time, to decide if it is helpful

## 2014-04-03 NOTE — Telephone Encounter (Signed)
Alida will fax office notes & CMN  to [facility] after Dr. Annamaria Boots signs CMN Kara Mead

## 2014-04-03 NOTE — Telephone Encounter (Signed)
Spoke with [facility].  They need rx signed and dated and also clinical notes stating use & benefit of Cpap.  Please advise [place] if you can send these.

## 2014-04-03 NOTE — Assessment & Plan Note (Addendum)
Current settings work very well for her and she continues with good compliance and control..She is convinced CPAP helps-sleeps better Plan- clarify with [facility]- replacement for old machine that is beginning to wear out

## 2014-04-09 ENCOUNTER — Ambulatory Visit (INDEPENDENT_AMBULATORY_CARE_PROVIDER_SITE_OTHER): Payer: Medicare Other | Admitting: Ophthalmology

## 2014-04-09 ENCOUNTER — Encounter: Payer: Self-pay | Admitting: Cardiology

## 2014-04-09 DIAGNOSIS — H43813 Vitreous degeneration, bilateral: Secondary | ICD-10-CM

## 2014-04-09 DIAGNOSIS — H35372 Puckering of macula, left eye: Secondary | ICD-10-CM

## 2014-04-09 DIAGNOSIS — I10 Essential (primary) hypertension: Secondary | ICD-10-CM

## 2014-04-09 DIAGNOSIS — H35033 Hypertensive retinopathy, bilateral: Secondary | ICD-10-CM

## 2014-04-09 DIAGNOSIS — H3531 Nonexudative age-related macular degeneration: Secondary | ICD-10-CM

## 2014-04-09 DIAGNOSIS — H2511 Age-related nuclear cataract, right eye: Secondary | ICD-10-CM

## 2014-04-16 ENCOUNTER — Encounter: Payer: Self-pay | Admitting: Internal Medicine

## 2014-05-24 ENCOUNTER — Ambulatory Visit (INDEPENDENT_AMBULATORY_CARE_PROVIDER_SITE_OTHER): Payer: Medicare Other | Admitting: Podiatry

## 2014-05-24 ENCOUNTER — Encounter: Payer: Self-pay | Admitting: Podiatry

## 2014-05-24 ENCOUNTER — Ambulatory Visit (INDEPENDENT_AMBULATORY_CARE_PROVIDER_SITE_OTHER): Payer: Medicare Other

## 2014-05-24 VITALS — BP 124/65 | HR 85 | Resp 16

## 2014-05-24 DIAGNOSIS — M779 Enthesopathy, unspecified: Secondary | ICD-10-CM

## 2014-05-24 DIAGNOSIS — Z8739 Personal history of other diseases of the musculoskeletal system and connective tissue: Secondary | ICD-10-CM

## 2014-05-24 DIAGNOSIS — Z87828 Personal history of other (healed) physical injury and trauma: Secondary | ICD-10-CM

## 2014-05-24 MED ORDER — TRIAMCINOLONE ACETONIDE 10 MG/ML IJ SUSP
10.0000 mg | Freq: Once | INTRAMUSCULAR | Status: AC
  Administered 2014-05-24: 10 mg

## 2014-05-24 NOTE — Progress Notes (Signed)
Subjective:     Patient ID: [name], female   DOB: Jul 17, 1934, 80 y.o.   MRN: [id number]  HPI patient states she fell on her right foot and sprained it. She also complains of inflammation around the tendon that has been stubborn on this foot   Review of Systems     Objective:   Physical Exam Neurovascular status intact with muscle strength adequate and no indication of motion loss or compromise of the posterior tibial or anterior tibial tendon right. Quite a bit of discomfort anterior tibial insertion right metatarsal and cuneiform with mild edema noted around the area    Assessment:     Sprain right foot secondary to trauma 4 weeks ago and tendinitis of the right anterior tibial tendon    Plan:     Reviewed the sprained foot and applied fascial brace to stabilize with ice to be applied twice a day. Injected the sheath of the tendon 3 mg Kenalog 5 mg Xylocaine to reduce the inflammation and reappoint if symptoms persist

## 2014-06-18 ENCOUNTER — Encounter: Payer: Self-pay | Admitting: Internal Medicine

## 2014-06-18 ENCOUNTER — Other Ambulatory Visit: Payer: Self-pay

## 2014-06-18 ENCOUNTER — Ambulatory Visit (INDEPENDENT_AMBULATORY_CARE_PROVIDER_SITE_OTHER): Payer: Medicare Other | Admitting: Internal Medicine

## 2014-06-18 VITALS — BP 132/76 | HR 85 | Ht 61.0 in | Wt 212.0 lb

## 2014-06-18 DIAGNOSIS — I441 Atrioventricular block, second degree: Secondary | ICD-10-CM | POA: Diagnosis not present

## 2014-06-18 DIAGNOSIS — I10 Essential (primary) hypertension: Secondary | ICD-10-CM | POA: Diagnosis not present

## 2014-06-18 LAB — MDC_IDC_ENUM_SESS_TYPE_INCLINIC
Battery Remaining Longevity: 93 mo
Brady Statistic AP VS Percent: 11 %
Brady Statistic AS VS Percent: 9 %
Date Time Interrogation Session: 20160229133522
Lead Channel Impedance Value: 643 Ohm
Lead Channel Pacing Threshold Pulse Width: 0.4 ms
Lead Channel Sensing Intrinsic Amplitude: 8 mV
Lead Channel Setting Pacing Amplitude: 2.5 V
MDC IDC MSMT BATTERY IMPEDANCE: 363 Ohm
MDC IDC MSMT BATTERY VOLTAGE: 2.79 V
MDC IDC MSMT LEADCHNL RA IMPEDANCE VALUE: 429 Ohm
MDC IDC MSMT LEADCHNL RA PACING THRESHOLD AMPLITUDE: 0.75 V
MDC IDC MSMT LEADCHNL RA PACING THRESHOLD PULSEWIDTH: 0.4 ms
MDC IDC MSMT LEADCHNL RA SENSING INTR AMPL: 2.8 mV
MDC IDC MSMT LEADCHNL RV PACING THRESHOLD AMPLITUDE: 0.5 V
MDC IDC SET LEADCHNL RA PACING AMPLITUDE: 2 V
MDC IDC SET LEADCHNL RV PACING PULSEWIDTH: 0.4 ms
MDC IDC SET LEADCHNL RV SENSING SENSITIVITY: 2.8 mV
MDC IDC STAT BRADY AP VP PERCENT: 44 %
MDC IDC STAT BRADY AS VP PERCENT: 36 %

## 2014-06-18 NOTE — Patient Instructions (Signed)
Your physician wants you to follow-up in: 12 months with Chanetta Marshall, NP. You will receive a reminder letter in the mail two months in advance. If you don't receive a letter, please call our office to schedule the follow-up appointment.  Remote monitoring is used to monitor your Pacemaker or ICD from home. This monitoring reduces the number of office visits required to check your device to one time per year. It allows [place] to keep an eye on the functioning of your device to ensure it is working properly. You are scheduled for a device check from home on 09/16/2014. You may send your transmission at any time that day. If you have a wireless device, the transmission will be sent automatically. After your physician reviews your transmission, you will receive a postcard with your next transmission date.

## 2014-06-18 NOTE — Progress Notes (Signed)
Electrophysiology Office Note   Date:  06/18/2014   ID:  [name], DOB October 29, 1934, MRN [id number]  PCP:  Donnajean Lopes, MD   Primary Electrophysiologist: [name], MD    Chief Complaint  Patient presents with  . Dizziness    Mobitz type II AV block     History of Present Illness: [name] is a 80 y.o. female who presents today for electrophysiology evaluation.   She has done well since her last visit.  She spends most of her time caring for her spouse who has back issues.  She has occasional postural dizziness.   Today, she denies symptoms of palpitations, chest pain, shortness of breath, orthopnea, PND, lower extremity edema, claudication, dizziness, presyncope, syncope, bleeding, or neurologic sequela. The patient is tolerating medications without difficulties and is otherwise without complaint today.    Past Medical History  Diagnosis Date  . Hypertension   . Abnormal heart rhythm   . Hyperlipidemia   . Chronic headaches   . Sleep apnea     on CPAP-uses [facility]  . Second degree Mobitz II AV block     s/p PPM by Dr Verlon Setting 2010  . Hypercholesteremia   . Obstructive sleep apnea on CPAP   . Cardiomegaly   . Atypical chest pain   . Osteoarthritis   . Urinary incontinence   . Dysrhythmia     HX OF MOBITZ 2 AV BLOCK AND FUNCTIONING PACEMAKER -DR. Erandy Mceachern FOLLOWS PT'S PACEMAKER CARE  . Recurrent upper respiratory infection (URI)     MARCH 2013 DX WITH BRONCHITIS--PT STATES RESOLVED--DOES HAVE SOME ALLERGIES AT PRESENT  . Headache(784.0)     PT STATES HER CHRONIC H/A'S HAVE RESOLVED  . GERD (gastroesophageal reflux disease)   . Pacemaker   . Depression   . Anemia     hx of not recent    Past Surgical History  Procedure Laterality Date  . Tubal ligation  1968  . Pacemaker insertion  2010    dual-chamber (MDT) implanted by Dr Verlon Setting  . Cesarean section    . Cesarean section    . Foot surgery    . Knee surgery      Arthroscopic surg  . Hand  surgery    . Total knee arthroplasty      right  . Carpal tunnel release    . Total knee arthroplasty  08/24/2011    Procedure: TOTAL KNEE ARTHROPLASTY;  Surgeon: Gearlean Alf, MD;  Location: [facility];  Service: Orthopedics;  Laterality: Left;  . Knee closed reduction  11/30/2011    Procedure: CLOSED MANIPULATION KNEE;  Surgeon: Gearlean Alf, MD;  Location: [facility];  Service: Orthopedics;  Laterality: Left;     Current Outpatient Prescriptions  Medication Sig Dispense Refill  . amLODipine (NORVASC) 5 MG tablet Take 5 mg by mouth at bedtime.     Marland Kitchen aspirin 81 MG tablet Take 81 mg by mouth every evening.    Marland Kitchen atenolol (TENORMIN) 50 MG tablet Take 50 mg by mouth 2 (two) times daily.     Marland Kitchen nystatin-triamcinolone ointment (MYCOLOG) Apply 1 application topically daily as needed (itching).    . Omega-3 Fatty Acids (FISH OIL) 1200 MG CAPS Take 1,200 mg by mouth every morning.    Marland Kitchen omeprazole (PRILOSEC) 20 MG capsule Take 20 mg by mouth every morning.     Marland Kitchen OVER THE COUNTER MEDICATION Take 1 tablet by mouth 2 (two) times daily. Macular degeneration supplement (AREDS 2)    .  oxybutynin (DITROPAN) 5 MG tablet Take 1 tablet by mouth daily.  2  . simvastatin (ZOCOR) 40 MG tablet Take 40 mg by mouth at bedtime.     Marland Kitchen telmisartan-hydrochlorothiazide (MICARDIS HCT) 80-12.5 MG per tablet Take 1 tablet by mouth daily with breakfast.      No current facility-administered medications for this visit.    Allergies:   Amoxicillin and Codeine   Social History:  The patient  reports that she has never smoked. She has never used smokeless tobacco. She reports that she does not drink alcohol or use illicit drugs.   Family History:  The patient's family history includes Allergies in her father; Breast cancer in her mother; Diabetes in her mother; Emphysema in her father; Heart disease in her father and mother; Hypertension in her father and mother.    ROS:  Please see the history of present illness.   All other  systems are reviewed and negative.    PHYSICAL EXAM: VS:  BP 132/76 mmHg  Pulse 85  Ht 5\' 1"  (1.549 m)  Wt 212 lb (96.163 kg)  BMI 40.08 kg/m2 , BMI Body mass index is 40.08 kg/(m^2). GEN: Well nourished, well developed, in no acute distress HEENT: normal Neck: no JVD, carotid bruits, or masses Cardiac: RRR; no murmurs, rubs, or gallops,trace edema  Respiratory:  clear to auscultation bilaterally, normal work of breathing GI: soft, nontender, nondistended, + BS MS: no deformity or atrophy Skin: warm and dry, device pocket is well healed Neuro:  Strength and sensation are intact Psych: euthymic mood, full affect  Device interrogation is reviewed today in detail.  See PaceArt for details.   Recent Labs: 08/27/2013: BUN 22; Creatinine 1.05; Hemoglobin 11.5*; Platelets 247; Potassium 4.1; Sodium 141    Lipid Panel     Component Value Date/Time   CHOL  06/04/2008 1430    171        ATP III CLASSIFICATION:  <200     mg/dL   Desirable  200-239  mg/dL   Borderline High  >=240    mg/dL   High          TRIG 144 06/04/2008 1430   HDL 47 06/04/2008 1430   CHOLHDL 3.6 06/04/2008 1430   VLDL 29 06/04/2008 1430   LDLCALC  06/04/2008 1430    95        Total Cholesterol/HDL:CHD Risk Coronary Heart Disease Risk Table                     Men   Women  1/2 Average Risk   3.4   3.3  Average Risk       5.0   4.4  2 X Average Risk   9.6   7.1  3 X Average Risk  23.4   11.0        Use the calculated Patient Ratio above and the CHD Risk Table to determine the patient's CHD Risk.        ATP III CLASSIFICATION (LDL):  <100     mg/dL   Optimal  100-129  mg/dL   Near or Above                    Optimal  130-159  mg/dL   Borderline  160-189  mg/dL   High  >190     mg/dL   Very High     Wt Readings from Last 3 Encounters:  06/18/14 212 lb (96.163 kg)  03/26/14 218 lb (  98.884 kg)  06/16/13 221 lb 3.2 oz (100.336 kg)     ASSESSMENT AND PLAN:  1.  Mobitz II second degree AV  block Normal pacemaker function See Pace Art report No changes today  2. HTN Controlled I suspect that hctz is contributing to postural dizziness.  No changes today though we did discuss adequate hydration and rising slowly from a seated position  carelink Return to see EP NP in 1 year   Signed, [name], MD  06/18/2014 10:16 AM     [facility] [address] [place] [address] [phone] (office) [phone] (fax)

## 2014-07-24 ENCOUNTER — Other Ambulatory Visit: Payer: Self-pay | Admitting: Gastroenterology

## 2014-08-02 ENCOUNTER — Encounter: Payer: Self-pay | Admitting: Podiatry

## 2014-08-02 ENCOUNTER — Ambulatory Visit (INDEPENDENT_AMBULATORY_CARE_PROVIDER_SITE_OTHER): Payer: Medicare Other | Admitting: Podiatry

## 2014-08-02 VITALS — BP 105/66 | HR 83 | Resp 12

## 2014-08-02 DIAGNOSIS — M722 Plantar fascial fibromatosis: Secondary | ICD-10-CM | POA: Diagnosis not present

## 2014-08-02 MED ORDER — TRIAMCINOLONE ACETONIDE 10 MG/ML IJ SUSP
10.0000 mg | Freq: Once | INTRAMUSCULAR | Status: AC
  Administered 2014-08-02: 10 mg

## 2014-08-02 NOTE — Progress Notes (Signed)
Subjective:     Patient ID: [name], female   DOB: 01-Jul-1934, 80 y.o.   MRN: [id number]  HPI patient states my right foot is still hurting but not quite in the same position. States that when she walks a lot it becomes painful   Review of Systems     Objective:   Physical Exam Neurovascular status intact with discomfort no longer at the anterior tibial insertion but more within the fascia of the mid fascial area with moderate depression of the arch    Assessment:     Fasciitis of the mid arch area right with inflammation with anterior tendon that seems to be doing well    Plan:     Injected the mid fascia area right 3 mg Kenalog 5 mg Xylocaine which was tolerated well and applied fascial strapping to lift the arch and reappoint if symptoms persist

## 2014-09-13 ENCOUNTER — Ambulatory Visit (INDEPENDENT_AMBULATORY_CARE_PROVIDER_SITE_OTHER): Payer: Medicare Other | Admitting: Podiatry

## 2014-09-13 ENCOUNTER — Encounter: Payer: Self-pay | Admitting: Podiatry

## 2014-09-13 ENCOUNTER — Ambulatory Visit (INDEPENDENT_AMBULATORY_CARE_PROVIDER_SITE_OTHER): Payer: Medicare Other

## 2014-09-13 VITALS — BP 109/59 | HR 65 | Resp 14

## 2014-09-13 DIAGNOSIS — M79671 Pain in right foot: Secondary | ICD-10-CM

## 2014-09-13 DIAGNOSIS — R609 Edema, unspecified: Secondary | ICD-10-CM

## 2014-09-13 DIAGNOSIS — Z8739 Personal history of other diseases of the musculoskeletal system and connective tissue: Secondary | ICD-10-CM | POA: Diagnosis not present

## 2014-09-13 DIAGNOSIS — Z87828 Personal history of other (healed) physical injury and trauma: Secondary | ICD-10-CM

## 2014-09-13 NOTE — Progress Notes (Signed)
Subjective:     Patient ID: [name], female   DOB: 1934-06-05, 80 y.o.   MRN: [id number]  HPI patient presents stating she's getting a lot of pain on top of her right foot after having had an injury and while she did not twist her foot she thinks maybe a branch fell on it but she does not think she is broken it but it's very sore   Review of Systems     Objective:   Physical Exam Neurovascular status intact with range of motion diminished secondary to pain on the right side. Pain in the dorsum of the right foot and some bruising on the medial side of the right foot but pain noted be moderate in nature with pitting edema +1+2. Negative Homan sign was noted    Assessment:     Probable trauma to the right foot with inflammation and cannot rule out fracture    Plan:     H&P and x-rays reviewed. I went ahead and I applied Unna boot to reduce the swelling along with compression Ace wrap and surgical shoe. Reappoint to recheck and may require more advanced x-ray of symptoms remain vague and we are not able to get a good clinical picture of her recovery

## 2014-09-13 NOTE — Progress Notes (Signed)
   Subjective:    Patient ID: [name], female    DOB: 08/15/1934, 80 y.o.   MRN: [id number]  HPI The patient presents with right doral burning pain,soreness, swelling, bruising(medial/lateral sides).this started a few days ago after pulling a limb on her foot, hurts with flexing and relaxing. She has been using ice on the foot which seems to help but soon after pain returns.   Review of Systems  Cardiovascular: Positive for leg swelling.       Objective:   Physical Exam        Assessment & Plan:

## 2014-09-18 ENCOUNTER — Ambulatory Visit (INDEPENDENT_AMBULATORY_CARE_PROVIDER_SITE_OTHER): Payer: Medicare Other | Admitting: *Deleted

## 2014-09-18 DIAGNOSIS — I441 Atrioventricular block, second degree: Secondary | ICD-10-CM

## 2014-09-18 NOTE — Progress Notes (Signed)
Remote pacemaker transmission.   

## 2014-09-23 LAB — CUP PACEART REMOTE DEVICE CHECK
Brady Statistic AP VP Percent: 58 %
Brady Statistic AP VS Percent: 3 %
Brady Statistic AS VS Percent: 1 %
Date Time Interrogation Session: 20160531134312
Lead Channel Impedance Value: 670 Ohm
Lead Channel Pacing Threshold Amplitude: 0.5 V
Lead Channel Pacing Threshold Amplitude: 0.625 V
Lead Channel Pacing Threshold Pulse Width: 0.4 ms
Lead Channel Pacing Threshold Pulse Width: 0.4 ms
Lead Channel Setting Pacing Amplitude: 2.5 V
Lead Channel Setting Sensing Sensitivity: 4 mV
MDC IDC MSMT BATTERY IMPEDANCE: 410 Ohm
MDC IDC MSMT BATTERY REMAINING LONGEVITY: 86 mo
MDC IDC MSMT BATTERY VOLTAGE: 2.79 V
MDC IDC MSMT LEADCHNL RA IMPEDANCE VALUE: 428 Ohm
MDC IDC MSMT LEADCHNL RA SENSING INTR AMPL: 1.4 mV
MDC IDC SET LEADCHNL RA PACING AMPLITUDE: 2 V
MDC IDC SET LEADCHNL RV PACING PULSEWIDTH: 0.4 ms
MDC IDC STAT BRADY AS VP PERCENT: 38 %

## 2014-09-28 ENCOUNTER — Encounter: Payer: Self-pay | Admitting: Internal Medicine

## 2014-10-08 ENCOUNTER — Encounter: Payer: Self-pay | Admitting: Cardiology

## 2014-10-09 ENCOUNTER — Telehealth: Payer: Self-pay | Admitting: *Deleted

## 2014-10-09 NOTE — Telephone Encounter (Signed)
Pt states her foot is still swelling but not painful, would like another appt to discuss with Dr. Paulla Dolly.

## 2014-10-10 ENCOUNTER — Encounter: Payer: Self-pay | Admitting: Internal Medicine

## 2014-10-11 ENCOUNTER — Encounter: Payer: Self-pay | Admitting: Podiatry

## 2014-10-11 ENCOUNTER — Ambulatory Visit (INDEPENDENT_AMBULATORY_CARE_PROVIDER_SITE_OTHER): Payer: Medicare Other | Admitting: Podiatry

## 2014-10-11 ENCOUNTER — Ambulatory Visit (INDEPENDENT_AMBULATORY_CARE_PROVIDER_SITE_OTHER): Payer: Medicare Other

## 2014-10-11 VITALS — BP 112/69 | HR 86 | Resp 12

## 2014-10-11 DIAGNOSIS — M79671 Pain in right foot: Secondary | ICD-10-CM

## 2014-10-11 DIAGNOSIS — M779 Enthesopathy, unspecified: Secondary | ICD-10-CM

## 2014-10-11 DIAGNOSIS — R609 Edema, unspecified: Secondary | ICD-10-CM

## 2014-10-11 MED ORDER — TRIAMCINOLONE ACETONIDE 10 MG/ML IJ SUSP
10.0000 mg | Freq: Once | INTRAMUSCULAR | Status: AC
  Administered 2014-10-11: 10 mg

## 2014-10-11 NOTE — Progress Notes (Signed)
Subjective:     Patient ID: [name], female   DOB: [birth year], 80 y.o.   MRN: [id number]  HPI patient states the swelling has improved but I'm still getting some pain in my ankle and I still gets swelling after I been on my foot for a while. I am somewhat better   Review of Systems     Objective:   Physical Exam Neurovascular status intact muscle strength adequate with discomfort in the sinus tarsi right with quite a bit of inflammation and it extends in a more distal direction. There is +1 to +2 pitting edema in the midfoot area and into the ankle with a negative Homans sign noted    Assessment:     Sinus tarsitis right with inflammatory changes and edema    Plan:     Today injected the sinus tarsi right 3 mg Kenalog 5 mg Xylocaine and I applied anklet with instructions on usage and Ace bandage usage at nighttime

## 2014-12-17 ENCOUNTER — Encounter: Payer: Self-pay | Admitting: Neurology

## 2014-12-17 ENCOUNTER — Ambulatory Visit (INDEPENDENT_AMBULATORY_CARE_PROVIDER_SITE_OTHER): Payer: Medicare Other | Admitting: Neurology

## 2014-12-17 VITALS — BP 132/74 | HR 68 | Ht 61.0 in | Wt 217.5 lb

## 2014-12-17 DIAGNOSIS — G43109 Migraine with aura, not intractable, without status migrainosus: Secondary | ICD-10-CM | POA: Diagnosis not present

## 2014-12-17 HISTORY — DX: Migraine with aura, not intractable, without status migrainosus: G43.109

## 2014-12-17 NOTE — Patient Instructions (Signed)

## 2014-12-17 NOTE — Progress Notes (Signed)
Reason for visit: Visual disturbance  Referring physician: Dr. Phillips Climes Holland is a 80 y.o. female  History of present illness:  Ms. [name] is a 80 year old right-handed white female with a history of classic migraine throughout her life. The patient indicates that when she entered menopause, her headaches became less frequent. She indicates that her headaches were associated with a central scotoma bilaterally, this will last about 20 minutes, resolve, then the headache would ensue. The patient would have photophobia and nausea with the headache. The headaches were quite severe, but after menopause the symptoms gradually disappeared, and they went for many years without any headaches. Over the last 3 or 4 years, she has begun developing the visual aura without headache. The episode will occur identical to her usual headaches with central scotoma lasting about 20 minutes, and the visual disturbance will then resolve. She denies any headache following the episode. This has become more frequent this summer, she indicates that she has been under some stress as her husband who has been diagnosed with vascular parkinsonism. The patient also has a history of macular degeneration. She denies numbness or weakness of the extremities, she denies any nausea or vomiting. She is sent to this office for an evaluation. She indicates that she has had a carotid upper study, she was told this was normal, the results are not available for my review.  Past Medical History  Diagnosis Date  . Hypertension   . Abnormal heart rhythm   . Hyperlipidemia   . Chronic headaches   . Sleep apnea     on CPAP-uses [facility]  . Second degree Mobitz II AV block     s/p PPM by Dr Verlon Setting 2010  . Hypercholesteremia   . Obstructive sleep apnea on CPAP   . Cardiomegaly   . Atypical chest pain   . Osteoarthritis   . Urinary incontinence   . Dysrhythmia     HX OF MOBITZ 2 AV BLOCK AND FUNCTIONING PACEMAKER -DR. JAMES  ALLRED FOLLOWS PT'S PACEMAKER CARE  . Recurrent upper respiratory infection (URI)     MARCH 2013 DX WITH BRONCHITIS--PT STATES RESOLVED--DOES HAVE SOME ALLERGIES AT PRESENT  . Headache(784.0)     PT STATES HER CHRONIC H/A'S HAVE RESOLVED  . GERD (gastroesophageal reflux disease)   . Pacemaker   . Depression   . Anemia     hx of not recent   . Chronic kidney disease   . Classic migraine with aura 12/17/2014    Past Surgical History  Procedure Laterality Date  . Tubal ligation  1968  . Pacemaker insertion  2010    dual-chamber (MDT) implanted by Dr Verlon Setting  . Cesarean section    . Foot surgery    . Knee surgery      Arthroscopic surg  . Hand surgery    . Total knee arthroplasty Bilateral     right  . Carpal tunnel release Bilateral   . Total knee arthroplasty  08/24/2011    Procedure: TOTAL KNEE ARTHROPLASTY;  Surgeon: Gearlean Alf, MD;  Location: [facility];  Service: Orthopedics;  Laterality: Left;  . Knee closed reduction  11/30/2011    Procedure: CLOSED MANIPULATION KNEE;  Surgeon: Gearlean Alf, MD;  Location: [facility];  Service: Orthopedics;  Laterality: Left;  . Cataract extraction Left     Family History  Problem Relation Age of Onset  . Emphysema Father   . Allergies Father   . Heart disease Father   .  Hypertension Father   . Heart attack Father   . Macular degeneration Father   . Heart disease Mother   . Diabetes Mother   . Hypertension Mother   . Breast cancer Mother     Age 39  . Osteoarthritis Mother   . Dementia Mother   . Macular degeneration Sister   . Migraines Daughter     Social history:  reports that she has never smoked. She has never used smokeless tobacco. She reports that she does not drink alcohol or use illicit drugs.  Medications:  Prior to Admission medications   Medication Sig Start Date End Date Taking? Authorizing Provider  amLODipine (NORVASC) 5 MG tablet Take 5 mg by mouth at bedtime.    Yes Historical Provider, MD  aspirin 81 MG  tablet Take 81 mg by mouth every evening.   Yes Historical Provider, MD  atenolol (TENORMIN) 50 MG tablet Take 50 mg by mouth 2 (two) times daily.    Yes Historical Provider, MD  nystatin-triamcinolone ointment (MYCOLOG) Apply 1 application topically daily as needed (itching).   Yes Historical Provider, MD  Omega-3 Fatty Acids (FISH OIL) 1200 MG CAPS Take 1,200 mg by mouth every morning.   Yes Historical Provider, MD  omeprazole (PRILOSEC) 20 MG capsule Take 20 mg by mouth every morning.    Yes Historical Provider, MD  OVER THE COUNTER MEDICATION Take 1 tablet by mouth 2 (two) times daily. Macular degeneration supplement (AREDS 2)   Yes Historical Provider, MD  oxybutynin (DITROPAN) 5 MG tablet Take 1 tablet by mouth daily. 04/08/14  Yes Historical Provider, MD  Probiotic Product (PROBIOTIC DAILY PO) Take 1 tablet by mouth daily.   Yes Historical Provider, MD  simvastatin (ZOCOR) 40 MG tablet Take 40 mg by mouth at bedtime.    Yes Historical Provider, MD  telmisartan-hydrochlorothiazide (MICARDIS HCT) 80-12.5 MG per tablet Take 1 tablet by mouth daily with breakfast.    Yes Historical Provider, MD      Allergies  Allergen Reactions  . Amoxicillin Diarrhea and Nausea And Vomiting  . Codeine Nausea And Vomiting    ROS:  Out of a complete 14 system review of symptoms, the patient complains only of the following symptoms, and all other reviewed systems are negative.  Hearing loss, ringing in the ears Birthmarks Loss of vision Shortness of breath Incontinence of bowel and bladder Headache, dizziness Not enough sleep Insomnia  Blood pressure 132/74, pulse 68, height 5\' 1"  (1.549 m), weight 217 lb 8 oz (98.657 kg).  Physical Exam  General: The patient is alert and cooperative at the time of the examination. The patient is moderately obese.  Eyes: Pupils are equal, round, and reactive to light. Discs are flat bilaterally.  Neck: The neck is supple, no carotid bruits are  noted.  Respiratory: The respiratory examination is clear.  Cardiovascular: The cardiovascular examination reveals a regular rate and rhythm, no obvious murmurs or rubs are noted.  Skin: Extremities are without significant edema.  Neurologic Exam  Mental status: The patient is alert and oriented x 3 at the time of the examination. The patient has apparent normal recent and remote memory, with an apparently normal attention span and concentration ability.  Cranial nerves: Facial symmetry is present. There is good sensation of the face to pinprick and soft touch bilaterally. The strength of the facial muscles and the muscles to head turning and shoulder shrug are normal bilaterally. Speech is well enunciated, no aphasia or dysarthria is noted. Extraocular movements are  full. Visual fields are full. The tongue is midline, and the patient has symmetric elevation of the soft palate. No obvious hearing deficits are noted.  Motor: The motor testing reveals 5 over 5 strength of all 4 extremities. Good symmetric motor tone is noted throughout.  Sensory: Sensory testing is intact to pinprick, soft touch, vibration sensation, and position sense on all 4 extremities. No evidence of extinction is noted.  Coordination: Cerebellar testing reveals good finger-nose-finger and heel-to-shin bilaterally.  Gait and station: Gait is normal. Tandem gait is slightly unsteady. Romberg is negative. No drift is seen.  Reflexes: Deep tendon reflexes are symmetric, but are depressed bilaterally. Toes are downgoing bilaterally.   Assessment/Plan:  1. Classic migraine headache  The patient has a history of classic migraine. The patient has had resolution of her headache, but she continues to have the visual aura. The patient has migraine equivalent at this point. No indication for treatment at this point unless the episodes become quite frequent. The patient will contact our office if any concerns arise.  Jill Alexanders MD 12/17/2014 2:47 PM  [facility] [address] [place], [place] [address]  Phone [phone] Fax [phone]

## 2014-12-18 ENCOUNTER — Ambulatory Visit (INDEPENDENT_AMBULATORY_CARE_PROVIDER_SITE_OTHER): Payer: Medicare Other | Admitting: *Deleted

## 2014-12-18 ENCOUNTER — Encounter: Payer: Self-pay | Admitting: Internal Medicine

## 2014-12-18 DIAGNOSIS — I441 Atrioventricular block, second degree: Secondary | ICD-10-CM

## 2014-12-18 NOTE — Progress Notes (Signed)
Remote pacemaker transmission.   

## 2014-12-31 LAB — CUP PACEART REMOTE DEVICE CHECK
Battery Impedance: 457 Ohm
Brady Statistic AP VP Percent: 48 %
Brady Statistic AP VS Percent: 12 %
Brady Statistic AS VS Percent: 7 %
Lead Channel Impedance Value: 424 Ohm
Lead Channel Impedance Value: 670 Ohm
Lead Channel Pacing Threshold Amplitude: 0.5 V
Lead Channel Pacing Threshold Pulse Width: 0.4 ms
Lead Channel Sensing Intrinsic Amplitude: 1 mV
Lead Channel Setting Pacing Amplitude: 2 V
Lead Channel Setting Pacing Pulse Width: 0.4 ms
MDC IDC MSMT BATTERY REMAINING LONGEVITY: 84 mo
MDC IDC MSMT BATTERY VOLTAGE: 2.79 V
MDC IDC MSMT LEADCHNL RA PACING THRESHOLD AMPLITUDE: 0.5 V
MDC IDC MSMT LEADCHNL RV PACING THRESHOLD PULSEWIDTH: 0.4 ms
MDC IDC SESS DTM: 20160830125525
MDC IDC SET LEADCHNL RV PACING AMPLITUDE: 2.5 V
MDC IDC SET LEADCHNL RV SENSING SENSITIVITY: 4 mV
MDC IDC STAT BRADY AS VP PERCENT: 33 %

## 2015-01-07 ENCOUNTER — Encounter: Payer: Self-pay | Admitting: *Deleted

## 2015-01-08 ENCOUNTER — Encounter: Payer: Self-pay | Admitting: Cardiology

## 2015-01-10 ENCOUNTER — Ambulatory Visit (INDEPENDENT_AMBULATORY_CARE_PROVIDER_SITE_OTHER): Payer: Medicare Other | Admitting: Ophthalmology

## 2015-01-10 DIAGNOSIS — H35372 Puckering of macula, left eye: Secondary | ICD-10-CM | POA: Diagnosis not present

## 2015-01-10 DIAGNOSIS — I10 Essential (primary) hypertension: Secondary | ICD-10-CM

## 2015-01-10 DIAGNOSIS — H3531 Nonexudative age-related macular degeneration: Secondary | ICD-10-CM | POA: Diagnosis not present

## 2015-01-10 DIAGNOSIS — H43813 Vitreous degeneration, bilateral: Secondary | ICD-10-CM | POA: Diagnosis not present

## 2015-01-10 DIAGNOSIS — H35033 Hypertensive retinopathy, bilateral: Secondary | ICD-10-CM | POA: Diagnosis not present

## 2015-03-21 ENCOUNTER — Ambulatory Visit (INDEPENDENT_AMBULATORY_CARE_PROVIDER_SITE_OTHER): Payer: Medicare Other | Admitting: *Deleted

## 2015-03-21 ENCOUNTER — Telehealth: Payer: Self-pay | Admitting: Cardiology

## 2015-03-21 DIAGNOSIS — I441 Atrioventricular block, second degree: Secondary | ICD-10-CM | POA: Diagnosis not present

## 2015-03-21 NOTE — Telephone Encounter (Signed)
LMOVM reminding pt to send remote transmission.   

## 2015-03-22 NOTE — Progress Notes (Signed)
Remote pacemaker transmission.   

## 2015-03-26 ENCOUNTER — Ambulatory Visit (INDEPENDENT_AMBULATORY_CARE_PROVIDER_SITE_OTHER): Payer: Medicare Other | Admitting: Internal Medicine

## 2015-03-26 ENCOUNTER — Encounter: Payer: Self-pay | Admitting: Internal Medicine

## 2015-03-26 VITALS — BP 130/70 | HR 68 | Ht 61.0 in | Wt 213.0 lb

## 2015-03-26 DIAGNOSIS — R05 Cough: Secondary | ICD-10-CM

## 2015-03-26 DIAGNOSIS — R053 Chronic cough: Secondary | ICD-10-CM

## 2015-03-26 DIAGNOSIS — G4733 Obstructive sleep apnea (adult) (pediatric): Secondary | ICD-10-CM | POA: Diagnosis not present

## 2015-03-26 NOTE — Progress Notes (Signed)
Patient ID: [name], female    DOB: 1934-10-05, 80 y.o.   MRN: [id number]  HPI 08/19/10- 78 yoF here with husband on kind referral by Dr Philip Aspen for pulmonary consultation because of cough. Never smoker. Describes onset of cough about 2 years ago. Persistent through most days, without defined aggravating factors. Had seasonal allergic rhinitis mainly in Spring years ago, but denies hx of asthma or pneumonia.  PFT at [facility] 03/18/10 showed mild reversible small  airway obstruction. Has been treated for GERD with dilatation of stricture twice, most recently 2 years ago- she isn't sure if that timing relates to onset of cough. Increasing omeprazole to twice daily may have helped dough. Notes cough on first waking in AM, then through day. Not related to eating. Good and bad days with no pattern. Feels throat tickle then coughs, without any sense of deep chest involvement. Denies nose or sinus discomfort, drainage, etc. Some short of breath with activity, but without wheeze and she attributes it to her level of fitness and obesity. Has OSA/ CPAP- she doesn't think this is the problem. Coughs little at night. CXR 03/03/10- reported mild CE. Pacemaker, spondylosis. No active process.  09/30/10- Cough, GERD, OSA/ CPAP Changed her Spiriva from morning to bedtime. Cough and wheeze were mostly in morning. Wheeze is gone. She notes a little cough, especially with laughter. Cough is much better than before. Spiriva does dry her mouth.  Continues CPAP every night at 12. She is facing TKR right knee.   03/31/11- 76 yoF never smoker followed for Cough, GERD, OSA/ CPAP Has had flu shot. Since last year has had a right total knee replacement without respiratory complication. She stopped Spiriva as unhelpful. She has a pacemaker but denies acute heart problems. She is going to a gym to ride an Comptroller. She has had no coughing lately.  Sleep apnea is well controlled using CPAP comfortably at 12 CWP, all night every  night/[facility].  03/30/12- 76 yoF never smoker followed for Cough, GERD, OSA/ CPAP Pt reports has not been using spiriva in "months" ,  Wearing CPAP 12/[facility] approx 7 hrs per night-- questions about still feeling fatigue even w CPAP use (are settings correct), not sleeping as well as she used to. Her cough problem resolved, so she dropped off  Spiriva months ago. She has had bilateral total knee replacement, most recently in May of 2013. She has also had trigger finger surgery. She did take her CPAP for these. Sleep is restless. She wakes up wide awake during the night with dry mouth and nasal stuffiness despite CPAP humidifier. CXR 08/17/11 IMPRESSION:  No acute cardiopulmonary disease.  Original Report Authenticated By: Curlene Dolphin, M.D.    05/09/12-  67 yoF never smoker followed for Cough, GERD, OSA/ CPAP follows for: pt states wears  mask for approx 7 hours a night /7 days weeks. pt is using a mask ..denies any mask or pressure issues.at this time. .<> pt  states  has cough off an on.thinks it may be due to allergies.  CPAP  AutoPap 7-15 /[facility] is comfortable. Download confirms good compliance and control. Fixed pressure of 12 would have been adequate, but she has been left on AutoPap and is satisfied with that. Mentions occasional cough and says she has not been coughing recently and does not concern. Cough was cleared with Spiriva when I first saw her one year ago. She has not felt the need for Spiriva recently.  11/25/12- 66 yoF never smoker followed for  Cough, GERD, OSA/ CPAP wearing cpap/ Auto/ [facility] every night.  Doesn't sleep well some nights.  No problems with mask or pressure.   Recent sleep latency one and a half hours, then will get up and read for an hour before going back to bed.  03/26/14- 78 yoF never smoker followed for Cough, GERD, OSA/ CPAP wearing cpap/ Auto 7-15/ [facility] every night. FOLLOW FOR:  OSA; wearing CPAP Auto [facility] every night; got a new mask last week, hasn't  started using it yet, likes her old mask, but they stopped manufacturing that one.  patient thinks her cough is due to allergies. Using CPAP auto 7-15/[facility] all night every night. Doesn't want sleep without it.Says she is due for a new machine. Button may be getting worn.  03/26/2015-80 year old female never smoker followed for cough, GERD, OSA/CPAP CPAP auto 7-15/[facility] FOLLOWS FOR: Wears CPAP every night for about 7-8 hours. DME is [employer]. Download confirms excellent compliance and control. She is very happy with this machine. She is getting enough sleep. Cough has been much better and she no longer needs Spiriva. We agreed she could reorder it if she ever felt she needed to start back for a while. Up-to-date on flu shot.  Review of Systems-See HPI Constitutional:   No-   weight loss, night sweats, fevers, chills, fatigue, lassitude. HEENT:   No-  headaches, difficulty swallowing, tooth/dental problems, sore throat,       No-  sneezing, itching, ear ache, nasal congestion, post nasal drip,  CV:  No-   chest pain, orthopnea, PND, swelling in lower extremities, anasarca, as dizziness, palpitations Resp: No-   shortness of breath with exertion or at rest.              No-   productive cough,  + non-productive cough,  No- coughing up of blood.              No-   change in color of mucus.  No- wheezing.   Skin: No-   rash or lesions. GI:  No-   heartburn, indigestion, abdominal pain, nausea, vomiting,  GU:  MS:  No-   joint pain or swelling.   Neuro-     nothing unusual Psych:  No- change in mood or affect. No depression or anxiety.  No memory loss.    Objective:   Physical Exam General- Alert, Oriented, Affect-appropriate, Distress- none acute, +overweight Skin- rash-none, lesions- none, excoriation- none Lymphadenopathy- none Head- atraumatic            Eyes- Gross vision intact, PERRLA, conjunctivae clear secretions            Ears- Hearing, canals-normal            Nose-  Clear, no-Septal dev, mucosa clear polyps, erosion, perforation             Throat- Mallampati III-IV , mucosa clear , drainage- none, tonsils- atrophic Neck- flexible , trachea midline, no stridor , thyroid nl, carotid no bruit Chest - symmetrical excursion , unlabored           Heart/CV- RRR , no murmur , no gallop  , no rub, nl s1 s2                           - JVD- none , edema- none, stasis changes- none, varices- none           Lung- clear to P&A, wheeze- none, cough- none ,  dullness-none, rub- none           Chest wall- L pacemaker Abd-  Br/ Gen/ Rectal- Not done, not indicated Extrem- cyanosis- none, clubbing, none, atrophy- none, strength- nl Neuro- grossly intact to observation

## 2015-03-26 NOTE — Assessment & Plan Note (Signed)
Cough has been much better, remaining nonspecific but was suspected relation to her GERD. She has not felt need for Spiriva in quite a while. Plan-reminder reflux precautions

## 2015-03-26 NOTE — Assessment & Plan Note (Signed)
Download confirms good compliance and control. We can continue current settings. Weight loss is encouraged.

## 2015-03-26 NOTE — Patient Instructions (Signed)
We can continue CPAP auto/ [facility]  We can refill Spiriva Handihaler in the future if needed

## 2015-04-03 LAB — CUP PACEART REMOTE DEVICE CHECK
Brady Statistic AS VP Percent: 30 %
Brady Statistic AS VS Percent: 11 %
Date Time Interrogation Session: 20161202184541
Implantable Lead Implant Date: 20100217
Implantable Lead Location: 753859
Implantable Lead Location: 753860
Implantable Lead Model: 5076
Lead Channel Impedance Value: 424 Ohm
Lead Channel Impedance Value: 635 Ohm
Lead Channel Setting Pacing Amplitude: 2.5 V
MDC IDC LEAD IMPLANT DT: 20100217
MDC IDC MSMT BATTERY IMPEDANCE: 506 Ohm
MDC IDC MSMT BATTERY REMAINING LONGEVITY: 82 mo
MDC IDC MSMT BATTERY VOLTAGE: 2.79 V
MDC IDC SET LEADCHNL RA PACING AMPLITUDE: 2 V
MDC IDC SET LEADCHNL RV PACING PULSEWIDTH: 0.4 ms
MDC IDC SET LEADCHNL RV SENSING SENSITIVITY: 4 mV
MDC IDC STAT BRADY AP VP PERCENT: 42 %
MDC IDC STAT BRADY AP VS PERCENT: 17 %

## 2015-04-05 ENCOUNTER — Encounter: Payer: Self-pay | Admitting: Cardiology

## 2015-05-03 ENCOUNTER — Encounter: Payer: Self-pay | Admitting: Internal Medicine

## 2015-06-14 DIAGNOSIS — J209 Acute bronchitis, unspecified: Secondary | ICD-10-CM | POA: Diagnosis not present

## 2015-06-14 DIAGNOSIS — R05 Cough: Secondary | ICD-10-CM | POA: Diagnosis not present

## 2015-06-14 DIAGNOSIS — Z6841 Body Mass Index (BMI) 40.0 and over, adult: Secondary | ICD-10-CM | POA: Diagnosis not present

## 2015-06-20 DIAGNOSIS — G4733 Obstructive sleep apnea (adult) (pediatric): Secondary | ICD-10-CM | POA: Diagnosis not present

## 2015-06-24 ENCOUNTER — Encounter: Payer: Medicare Other | Admitting: Internal Medicine

## 2015-07-01 ENCOUNTER — Ambulatory Visit (INDEPENDENT_AMBULATORY_CARE_PROVIDER_SITE_OTHER): Payer: PPO | Admitting: Internal Medicine

## 2015-07-01 ENCOUNTER — Encounter: Payer: Self-pay | Admitting: Internal Medicine

## 2015-07-01 VITALS — BP 127/78 | HR 69 | Ht 61.0 in | Wt 207.0 lb

## 2015-07-01 DIAGNOSIS — I441 Atrioventricular block, second degree: Secondary | ICD-10-CM | POA: Diagnosis not present

## 2015-07-01 DIAGNOSIS — I10 Essential (primary) hypertension: Secondary | ICD-10-CM | POA: Diagnosis not present

## 2015-07-01 LAB — CUP PACEART INCLINIC DEVICE CHECK
Battery Impedance: 554 Ohm
Battery Voltage: 2.79 V
Brady Statistic AP VP Percent: 44 %
Brady Statistic AP VS Percent: 13 %
Brady Statistic AS VP Percent: 35 %
Date Time Interrogation Session: 20170313153139
Implantable Lead Implant Date: 20100217
Implantable Lead Implant Date: 20100217
Implantable Lead Location: 753860
Implantable Lead Model: 5076
Implantable Lead Model: 5076
Lead Channel Impedance Value: 429 Ohm
Lead Channel Impedance Value: 619 Ohm
Lead Channel Pacing Threshold Amplitude: 0.5 V
Lead Channel Pacing Threshold Amplitude: 0.625 V
Lead Channel Pacing Threshold Amplitude: 1 V
Lead Channel Pacing Threshold Amplitude: 1.25 V
Lead Channel Pacing Threshold Pulse Width: 0.4 ms
Lead Channel Pacing Threshold Pulse Width: 0.4 ms
Lead Channel Pacing Threshold Pulse Width: 0.4 ms
Lead Channel Sensing Intrinsic Amplitude: 2 mV
Lead Channel Setting Pacing Amplitude: 2 V
Lead Channel Setting Pacing Amplitude: 2.5 V
Lead Channel Setting Sensing Sensitivity: 4 mV
MDC IDC LEAD LOCATION: 753859
MDC IDC MSMT BATTERY REMAINING LONGEVITY: 78 mo
MDC IDC MSMT LEADCHNL RV PACING THRESHOLD PULSEWIDTH: 0.4 ms
MDC IDC SET LEADCHNL RV PACING PULSEWIDTH: 0.4 ms
MDC IDC STAT BRADY AS VS PERCENT: 8 %

## 2015-07-01 NOTE — Patient Instructions (Signed)
Medication Instructions:  Your physician recommends that you continue on your current medications as directed. Please refer to the Current Medication list given to you today.   Labwork: None ordered   Testing/Procedures: None ordered   Follow-Up: Your physician wants you to follow-up in: 12 months with Dr Rayann Heman Dennis Bast will receive a reminder letter in the mail two months in advance. If you don't receive a letter, please call our office to schedule the follow-up appointment.   Remote monitoring is used to monitor your Pacemaker from home. This monitoring reduces the number of office visits required to check your device to one time per year. It allows [place] to keep an eye on the functioning of your device to ensure it is working properly. You are scheduled for a device check from home on 09/30/15. You may send your transmission at any time that day. If you have a wireless device, the transmission will be sent automatically. After your physician reviews your transmission, you will receive a postcard with your next transmission date.    Any Other Special Instructions Will Be Listed Below (If Applicable).     If you need a refill on your cardiac medications before your next appointment, please call your pharmacy.

## 2015-07-01 NOTE — Progress Notes (Signed)
Electrophysiology Office Note   Date:  07/01/2015   ID:  KARAGAN SIRKIN, DOB [birth year], MRN [id number]  PCP:  Donnajean Lopes, MD   Primary Electrophysiologist: [name], MD    CC: pacemaker follow up   History of Present Illness: [name] is a 81 y.o. female who presents today for electrophysiology evaluation.   She has done well since her last visit. She has occasional postural dizziness.   Today, she denies symptoms of palpitations, chest pain, shortness of breath, orthopnea, PND, lower extremity edema, claudication, dizziness, presyncope, syncope, bleeding, or neurologic sequela. The patient is tolerating medications without difficulties and is otherwise without complaint today.    Past Medical History  Diagnosis Date  . Hypertension   . Abnormal heart rhythm   . Hyperlipidemia   . Chronic headaches   . Sleep apnea     on CPAP-uses [facility]  . Second degree Mobitz II AV block     s/p PPM by Dr Verlon Setting 2010  . Hypercholesteremia   . Obstructive sleep apnea on CPAP   . Cardiomegaly   . Atypical chest pain   . Osteoarthritis   . Urinary incontinence   . Dysrhythmia     HX OF MOBITZ 2 AV BLOCK AND FUNCTIONING PACEMAKER -DR. JAMES ALLRED FOLLOWS PT'S PACEMAKER CARE  . Recurrent upper respiratory infection (URI)     MARCH 2013 DX WITH BRONCHITIS--PT STATES RESOLVED--DOES HAVE SOME ALLERGIES AT PRESENT  . Headache(784.0)     PT STATES HER CHRONIC H/A'S HAVE RESOLVED  . GERD (gastroesophageal reflux disease)   . Pacemaker   . Depression   . Anemia     hx of not recent   . Chronic kidney disease   . Classic migraine with aura 12/17/2014   Past Surgical History  Procedure Laterality Date  . Tubal ligation  1968  . Pacemaker insertion  2010    dual-chamber (MDT) implanted by Dr Verlon Setting  . Cesarean section    . Foot surgery    . Knee surgery      Arthroscopic surg  . Hand surgery    . Total knee arthroplasty Bilateral     right  . Carpal tunnel release  Bilateral   . Total knee arthroplasty  08/24/2011    Procedure: TOTAL KNEE ARTHROPLASTY;  Surgeon: Gearlean Alf, MD;  Location: [facility];  Service: Orthopedics;  Laterality: Left;  . Knee closed reduction  11/30/2011    Procedure: CLOSED MANIPULATION KNEE;  Surgeon: Gearlean Alf, MD;  Location: [facility];  Service: Orthopedics;  Laterality: Left;  . Cataract extraction Left      Current Outpatient Prescriptions  Medication Sig Dispense Refill  . amLODipine (NORVASC) 5 MG tablet Take 5 mg by mouth at bedtime.     Marland Kitchen aspirin 81 MG tablet Take 81 mg by mouth every evening.    Marland Kitchen atenolol (TENORMIN) 50 MG tablet Take 50 mg by mouth 2 (two) times daily.     . Omega-3 Fatty Acids (FISH OIL) 1200 MG CAPS Take 1,200 mg by mouth every morning.    Marland Kitchen omeprazole (PRILOSEC) 20 MG capsule Take 20 mg by mouth every morning.     Marland Kitchen OVER THE COUNTER MEDICATION Take 1 tablet by mouth 2 (two) times daily. Macular degeneration supplement (AREDS 2)    . oxybutynin (DITROPAN) 5 MG tablet Take 1 tablet by mouth daily.  2  . Probiotic Product (PROBIOTIC DAILY PO) Take 1 tablet by mouth daily.    . simvastatin (  ZOCOR) 40 MG tablet Take 40 mg by mouth at bedtime.     Marland Kitchen telmisartan-hydrochlorothiazide (MICARDIS HCT) 80-12.5 MG per tablet Take 1 tablet by mouth daily with breakfast.      No current facility-administered medications for this visit.    Allergies:   Amoxicillin and Codeine   Social History:  The patient  reports that she has never smoked. She has never used smokeless tobacco. She reports that she does not drink alcohol or use illicit drugs.   Family History:  The patient's family history includes Allergies in her father; Breast cancer in her mother; Dementia in her mother; Diabetes in her mother; Emphysema in her father; Heart attack in her father; Heart disease in her father and mother; Hypertension in her father and mother; Macular degeneration in her father and sister; Migraines in her daughter;  Osteoarthritis in her mother.    ROS:  Please see the history of present illness.   All other systems are reviewed and negative.    PHYSICAL EXAM: VS:  BP 127/78 mmHg  Pulse 69  Ht 5\' 1"  (1.549 m)  Wt 207 lb (93.895 kg)  BMI 39.13 kg/m2 , BMI Body mass index is 39.13 kg/(m^2). GEN: Obese, well developed, in no acute distress HEENT: normal Neck: no JVD, carotid bruits, or masses Cardiac: RRR; no murmurs, rubs, or gallops,trace edema  Respiratory:  clear to auscultation bilaterally, normal work of breathing GI: soft, nontender, nondistended, + BS MS: no deformity or atrophy Skin: warm and dry, device pocket is well healed Neuro:  Strength and sensation are intact Psych: euthymic mood, full affect  Device interrogation is reviewed today in detail.  See PaceArt for details.   Recent Labs: No results found for requested labs within last 365 days.    Lipid Panel     Component Value Date/Time   CHOL  06/04/2008 1430    171        ATP III CLASSIFICATION:  <200     mg/dL   Desirable  200-239  mg/dL   Borderline High  >=240    mg/dL   High          TRIG 144 06/04/2008 1430   HDL 47 06/04/2008 1430   CHOLHDL 3.6 06/04/2008 1430   VLDL 29 06/04/2008 1430   LDLCALC  06/04/2008 1430    95        Total Cholesterol/HDL:CHD Risk Coronary Heart Disease Risk Table                     Men   Women  1/2 Average Risk   3.4   3.3  Average Risk       5.0   4.4  2 X Average Risk   9.6   7.1  3 X Average Risk  23.4   11.0        Use the calculated Patient Ratio above and the CHD Risk Table to determine the patient's CHD Risk.        ATP III CLASSIFICATION (LDL):  <100     mg/dL   Optimal  100-129  mg/dL   Near or Above                    Optimal  130-159  mg/dL   Borderline  160-189  mg/dL   High  >190     mg/dL   Very High     Wt Readings from Last 3 Encounters:  07/01/15 207 lb (93.895 kg)  03/26/15 213 lb (96.616 kg)  12/17/14 217 lb 8 oz (98.657 kg)     ASSESSMENT  AND PLAN:  1.  Mobitz II second degree AV block Normal pacemaker function See Pace Art report No changes today  2. HTN Stable No change required today  carelink Return to see Dr Rayann Heman in 1 year    Signed, Patsey Berthold, NP  07/01/2015 2:02 PM     [address] [place] [id number] [phone] (office) [phone] (fax)

## 2015-08-19 DIAGNOSIS — Z803 Family history of malignant neoplasm of breast: Secondary | ICD-10-CM | POA: Diagnosis not present

## 2015-08-19 DIAGNOSIS — Z1231 Encounter for screening mammogram for malignant neoplasm of breast: Secondary | ICD-10-CM | POA: Diagnosis not present

## 2015-09-23 DIAGNOSIS — G4733 Obstructive sleep apnea (adult) (pediatric): Secondary | ICD-10-CM | POA: Diagnosis not present

## 2015-09-30 ENCOUNTER — Ambulatory Visit (INDEPENDENT_AMBULATORY_CARE_PROVIDER_SITE_OTHER): Payer: PPO | Admitting: *Deleted

## 2015-09-30 ENCOUNTER — Telehealth: Payer: Self-pay | Admitting: Cardiology

## 2015-09-30 DIAGNOSIS — I441 Atrioventricular block, second degree: Secondary | ICD-10-CM

## 2015-09-30 NOTE — Telephone Encounter (Signed)
LMOVM reminding pt to send remote transmission.   

## 2015-10-01 NOTE — Progress Notes (Signed)
Remote pacemaker transmission.   

## 2015-10-03 LAB — CUP PACEART REMOTE DEVICE CHECK
Battery Remaining Longevity: 74 mo
Battery Voltage: 2.79 V
Brady Statistic AP VS Percent: 1 %
Brady Statistic AS VS Percent: 0 %
Date Time Interrogation Session: 20170612184921
Implantable Lead Implant Date: 20100217
Implantable Lead Location: 753860
Implantable Lead Model: 5076
Lead Channel Pacing Threshold Amplitude: 0.5 V
Lead Channel Pacing Threshold Pulse Width: 0.4 ms
Lead Channel Pacing Threshold Pulse Width: 0.4 ms
Lead Channel Setting Pacing Amplitude: 2 V
Lead Channel Setting Pacing Amplitude: 2.5 V
Lead Channel Setting Pacing Pulse Width: 0.4 ms
Lead Channel Setting Sensing Sensitivity: 4 mV
MDC IDC LEAD IMPLANT DT: 20100217
MDC IDC LEAD LOCATION: 753859
MDC IDC MSMT BATTERY IMPEDANCE: 603 Ohm
MDC IDC MSMT LEADCHNL RA IMPEDANCE VALUE: 466 Ohm
MDC IDC MSMT LEADCHNL RA PACING THRESHOLD AMPLITUDE: 0.625 V
MDC IDC MSMT LEADCHNL RA SENSING INTR AMPL: 1 mV
MDC IDC MSMT LEADCHNL RV IMPEDANCE VALUE: 644 Ohm
MDC IDC STAT BRADY AP VP PERCENT: 55 %
MDC IDC STAT BRADY AS VP PERCENT: 43 %

## 2015-10-07 DIAGNOSIS — H35031 Hypertensive retinopathy, right eye: Secondary | ICD-10-CM | POA: Diagnosis not present

## 2015-10-07 DIAGNOSIS — H35033 Hypertensive retinopathy, bilateral: Secondary | ICD-10-CM | POA: Diagnosis not present

## 2015-10-07 DIAGNOSIS — H353114 Nonexudative age-related macular degeneration, right eye, advanced atrophic with subfoveal involvement: Secondary | ICD-10-CM | POA: Diagnosis not present

## 2015-10-07 DIAGNOSIS — H353134 Nonexudative age-related macular degeneration, bilateral, advanced atrophic with subfoveal involvement: Secondary | ICD-10-CM | POA: Diagnosis not present

## 2015-10-07 DIAGNOSIS — H35032 Hypertensive retinopathy, left eye: Secondary | ICD-10-CM | POA: Diagnosis not present

## 2015-10-07 DIAGNOSIS — H353124 Nonexudative age-related macular degeneration, left eye, advanced atrophic with subfoveal involvement: Secondary | ICD-10-CM | POA: Diagnosis not present

## 2015-10-10 ENCOUNTER — Encounter: Payer: Self-pay | Admitting: Cardiology

## 2015-10-10 ENCOUNTER — Ambulatory Visit (INDEPENDENT_AMBULATORY_CARE_PROVIDER_SITE_OTHER): Payer: PPO | Admitting: Ophthalmology

## 2015-10-10 DIAGNOSIS — H353132 Nonexudative age-related macular degeneration, bilateral, intermediate dry stage: Secondary | ICD-10-CM | POA: Diagnosis not present

## 2015-10-10 DIAGNOSIS — H35372 Puckering of macula, left eye: Secondary | ICD-10-CM | POA: Diagnosis not present

## 2015-10-10 DIAGNOSIS — I10 Essential (primary) hypertension: Secondary | ICD-10-CM | POA: Diagnosis not present

## 2015-10-10 DIAGNOSIS — H35033 Hypertensive retinopathy, bilateral: Secondary | ICD-10-CM

## 2015-10-10 DIAGNOSIS — H43813 Vitreous degeneration, bilateral: Secondary | ICD-10-CM

## 2015-12-09 DIAGNOSIS — N183 Chronic kidney disease, stage 3 (moderate): Secondary | ICD-10-CM | POA: Diagnosis not present

## 2015-12-09 DIAGNOSIS — E784 Other hyperlipidemia: Secondary | ICD-10-CM | POA: Diagnosis not present

## 2015-12-09 DIAGNOSIS — I10 Essential (primary) hypertension: Secondary | ICD-10-CM | POA: Diagnosis not present

## 2015-12-09 DIAGNOSIS — R358 Other polyuria: Secondary | ICD-10-CM | POA: Diagnosis not present

## 2015-12-09 DIAGNOSIS — R8299 Other abnormal findings in urine: Secondary | ICD-10-CM | POA: Diagnosis not present

## 2015-12-16 DIAGNOSIS — Z23 Encounter for immunization: Secondary | ICD-10-CM | POA: Diagnosis not present

## 2015-12-16 DIAGNOSIS — N183 Chronic kidney disease, stage 3 (moderate): Secondary | ICD-10-CM | POA: Diagnosis not present

## 2015-12-16 DIAGNOSIS — Z1389 Encounter for screening for other disorder: Secondary | ICD-10-CM | POA: Diagnosis not present

## 2015-12-16 DIAGNOSIS — G4733 Obstructive sleep apnea (adult) (pediatric): Secondary | ICD-10-CM | POA: Diagnosis not present

## 2015-12-16 DIAGNOSIS — R5383 Other fatigue: Secondary | ICD-10-CM | POA: Diagnosis not present

## 2015-12-16 DIAGNOSIS — Z0000 Encounter for general adult medical examination without abnormal findings: Secondary | ICD-10-CM | POA: Diagnosis not present

## 2015-12-16 DIAGNOSIS — E784 Other hyperlipidemia: Secondary | ICD-10-CM | POA: Diagnosis not present

## 2015-12-16 DIAGNOSIS — Z6841 Body Mass Index (BMI) 40.0 and over, adult: Secondary | ICD-10-CM | POA: Diagnosis not present

## 2015-12-16 DIAGNOSIS — N390 Urinary tract infection, site not specified: Secondary | ICD-10-CM | POA: Diagnosis not present

## 2015-12-16 DIAGNOSIS — N3281 Overactive bladder: Secondary | ICD-10-CM | POA: Diagnosis not present

## 2015-12-31 ENCOUNTER — Telehealth: Payer: Self-pay | Admitting: Cardiology

## 2015-12-31 ENCOUNTER — Encounter: Payer: PPO | Admitting: *Deleted

## 2015-12-31 NOTE — Telephone Encounter (Signed)
LMOVM reminding pt to send remote transmission.   

## 2016-01-03 ENCOUNTER — Encounter: Payer: Self-pay | Admitting: Cardiology

## 2016-01-07 ENCOUNTER — Ambulatory Visit (INDEPENDENT_AMBULATORY_CARE_PROVIDER_SITE_OTHER): Payer: PPO | Admitting: *Deleted

## 2016-01-07 DIAGNOSIS — I441 Atrioventricular block, second degree: Secondary | ICD-10-CM | POA: Diagnosis not present

## 2016-01-08 ENCOUNTER — Encounter: Payer: Self-pay | Admitting: Cardiology

## 2016-01-08 NOTE — Progress Notes (Signed)
Remote pacemaker transmission.   

## 2016-01-21 DIAGNOSIS — G4730 Sleep apnea, unspecified: Secondary | ICD-10-CM | POA: Diagnosis not present

## 2016-01-21 DIAGNOSIS — G4733 Obstructive sleep apnea (adult) (pediatric): Secondary | ICD-10-CM | POA: Diagnosis not present

## 2016-01-27 LAB — CUP PACEART REMOTE DEVICE CHECK
Battery Impedance: 653 Ohm
Battery Remaining Longevity: 71 mo
Brady Statistic AP VP Percent: 56 %
Brady Statistic AS VS Percent: 0 %
Implantable Lead Implant Date: 20100217
Implantable Lead Implant Date: 20100217
Implantable Lead Location: 753860
Implantable Lead Model: 5076
Lead Channel Impedance Value: 436 Ohm
Lead Channel Impedance Value: 671 Ohm
Lead Channel Pacing Threshold Pulse Width: 0.4 ms
Lead Channel Sensing Intrinsic Amplitude: 1.4 mV
Lead Channel Setting Pacing Pulse Width: 0.4 ms
MDC IDC LEAD LOCATION: 753859
MDC IDC MSMT BATTERY VOLTAGE: 2.78 V
MDC IDC MSMT LEADCHNL RA PACING THRESHOLD AMPLITUDE: 0.625 V
MDC IDC MSMT LEADCHNL RA PACING THRESHOLD PULSEWIDTH: 0.4 ms
MDC IDC MSMT LEADCHNL RV PACING THRESHOLD AMPLITUDE: 0.5 V
MDC IDC SESS DTM: 20170919142544
MDC IDC SET LEADCHNL RA PACING AMPLITUDE: 2 V
MDC IDC SET LEADCHNL RV PACING AMPLITUDE: 2.5 V
MDC IDC SET LEADCHNL RV SENSING SENSITIVITY: 4 mV
MDC IDC STAT BRADY AP VS PERCENT: 0 %
MDC IDC STAT BRADY AS VP PERCENT: 44 %

## 2016-03-05 ENCOUNTER — Ambulatory Visit (INDEPENDENT_AMBULATORY_CARE_PROVIDER_SITE_OTHER): Payer: PPO | Admitting: Podiatry

## 2016-03-05 ENCOUNTER — Ambulatory Visit (INDEPENDENT_AMBULATORY_CARE_PROVIDER_SITE_OTHER): Payer: PPO

## 2016-03-05 ENCOUNTER — Encounter: Payer: Self-pay | Admitting: Podiatry

## 2016-03-05 DIAGNOSIS — M79671 Pain in right foot: Secondary | ICD-10-CM | POA: Diagnosis not present

## 2016-03-05 DIAGNOSIS — M779 Enthesopathy, unspecified: Secondary | ICD-10-CM

## 2016-03-05 DIAGNOSIS — T148XXA Other injury of unspecified body region, initial encounter: Secondary | ICD-10-CM

## 2016-03-05 MED ORDER — TRIAMCINOLONE ACETONIDE 10 MG/ML IJ SUSP
10.0000 mg | Freq: Once | INTRAMUSCULAR | Status: AC
  Administered 2016-03-05: 10 mg

## 2016-03-06 NOTE — Progress Notes (Signed)
Subjective:     Patient ID: [name], female   DOB: 1934-08-08, 81 y.o.   MRN: [id number]  HPI patient states she's developed pain on top of her right foot that makes ambulation difficult and at times she gets some shooting pains   Review of Systems     Objective:   Physical Exam Neurovascular status intact with inflammation pain in the dorsum of the right foot with fluid buildup around the extensor tendon complex but no indication of tendon dysfunction    Assessment:     Appears to be a tendinitis type condition    Plan:     Reviewed condition and recommended anti-inflammatories and didn't do careful sheath injection right 3 mg Kenalog 5 mg Xylocaine. Gave instructions on physical therapy and reappoint to recheck  X-ray report indicated some roughness to the bone which may indicate mild arthritis but no indication of deeper pathology

## 2016-03-26 ENCOUNTER — Ambulatory Visit: Payer: PPO | Admitting: Internal Medicine

## 2016-04-01 ENCOUNTER — Ambulatory Visit (INDEPENDENT_AMBULATORY_CARE_PROVIDER_SITE_OTHER): Payer: PPO | Admitting: Internal Medicine

## 2016-04-01 ENCOUNTER — Encounter: Payer: Self-pay | Admitting: Internal Medicine

## 2016-04-01 VITALS — BP 122/78 | HR 71 | Ht 61.0 in | Wt 214.4 lb

## 2016-04-01 DIAGNOSIS — R05 Cough: Secondary | ICD-10-CM

## 2016-04-01 DIAGNOSIS — G4733 Obstructive sleep apnea (adult) (pediatric): Secondary | ICD-10-CM | POA: Diagnosis not present

## 2016-04-01 DIAGNOSIS — R053 Chronic cough: Secondary | ICD-10-CM

## 2016-04-01 DIAGNOSIS — I441 Atrioventricular block, second degree: Secondary | ICD-10-CM | POA: Diagnosis not present

## 2016-04-01 NOTE — Assessment & Plan Note (Signed)
CPAP download confirms excellent compliance and control and she is very comfortable with her current pressure range in machine. No changes required.

## 2016-04-01 NOTE — Progress Notes (Signed)
Patient ID: [name], female    DOB: Oct 24, 1934, 81 y.o.   MRN: [id number]  HPI Female never smoker followed for cough/ GERD, OSA/CPAP, complicated by HBP, [id number] Pacemaker PFT at [facility] 03/18/10 showed mild reversible small  airway obstruction   08/19/10- 76 yoF here with husband on kind referral by Dr Philip Aspen for pulmonary consultation because of cough. Never smoker. Describes onset of cough about 2 years ago. Persistent through most days, without defined aggravating factors. Had seasonal allergic rhinitis mainly in Spring years ago, but denies hx of asthma or pneumonia.  PFT at [facility] 03/18/10 showed mild reversible small  airway obstruction. Has been treated for GERD with dilatation of stricture twice, most recently 2 years ago- she isn't sure if that timing relates to onset of cough. Increasing omeprazole to twice daily may have helped dough. Notes cough on first waking in AM, then through day. Not related to eating. Good and bad days with no pattern. Feels throat tickle then coughs, without any sense of deep chest involvement. Denies nose or sinus discomfort, drainage, etc. Some short of breath with activity, but without wheeze and she attributes it to her level of fitness and obesity. Has OSA/ CPAP- she doesn't think this is the problem. Coughs little at night. CXR 03/03/10- reported mild CE. Pacemaker, spondylosis. No active process.     03/26/2015-81 year old female never smoker followed for cough, GERD, OSA/CPAP CPAP auto 7-15/[facility] FOLLOWS FOR: Wears CPAP every night for about 7-8 hours. DME is [employer]. Download confirms excellent compliance and control. She is very happy with this machine. She is getting enough sleep. Cough has been much better and she no longer needs Spiriva. We agreed she could reorder it if she ever felt she needed to start back for a while. Up-to-date on flu shot.  04/01/2016-81 year old female never smoker followed for cough/ GERD, OSA/CPAP,  complicated by HBP, [id number] CPAP auto 7-15/[facility] Very comfortable with CPAP used every night. It prevents her snoring. She works to maintain good sleep habits and denies significant daytime sleepiness. Cough has substantially resolved. It flared with that obvious cold, lasting a couple of weeks but okay again now. She no longer uses Spiriva.  Review of Systems-See HPI Constitutional:   No-   weight loss, night sweats, fevers, chills, fatigue, lassitude. HEENT:   No-  headaches, difficulty swallowing, tooth/dental problems, sore throat,       No-  sneezing, itching, ear ache, nasal congestion, post nasal drip,  CV:  No-   chest pain, orthopnea, PND, swelling in lower extremities, anasarca, as dizziness, palpitations Resp: No-   shortness of breath with exertion or at rest.              No-   productive cough,  + non-productive cough,  No- coughing up of blood.              No-   change in color of mucus.  No- wheezing.   Skin: No-   rash or lesions. GI:  No-   heartburn, indigestion, abdominal pain, nausea, vomiting,  GU:  MS:  No-   joint pain or swelling.   Neuro-     nothing unusual Psych:  No- change in mood or affect. No depression or anxiety.  No memory loss.    Objective:   Physical Exam General- Alert, Oriented, Affect-appropriate, Distress- none acute, +overweight Skin- rash-none, lesions- none, excoriation- none Lymphadenopathy- none Head- atraumatic            Eyes-  Gross vision intact, PERRLA, conjunctivae clear secretions            Ears- Hearing, canals-normal            Nose- Clear, no-Septal dev, mucosa clear polyps, erosion, perforation             Throat- Mallampati III-IV , mucosa clear , drainage- none, tonsils- atrophic Neck- flexible , trachea midline, no stridor , thyroid nl, carotid no bruit Chest - symmetrical excursion , unlabored           Heart/CV- RRR , no murmur , no gallop  , no rub, nl s1 s2                           - JVD- none , edema- none, stasis  changes- none, varices- none           Lung- + Basilar breath sounds are coarse without wheeze,  cough- none , dullness-none, rub- none           Chest wall-+ L pacemaker Abd-  Br/ Gen/ Rectal- Not done, not indicated Extrem- cyanosis- none, clubbing, none, atrophy- none, strength- nl Neuro- grossly intact to observation

## 2016-04-01 NOTE — Assessment & Plan Note (Signed)
Pacemaker managed by cardiology °

## 2016-04-01 NOTE — Patient Instructions (Signed)
We can continue CPAP auto 7-15, mask of choice, humidifier, supplies, AirView      Dx OSA   Please call if needed

## 2016-04-01 NOTE — Assessment & Plan Note (Signed)
In the past she had had a significant problem with chronic cough but that had resolved. Recurrence during time of a respiratory infection is not surprising but fortunately cough has resolved and she is back to baseline Hopefully this won't be an ongoing problem again.

## 2016-04-07 ENCOUNTER — Telehealth: Payer: Self-pay | Admitting: Cardiology

## 2016-04-07 ENCOUNTER — Ambulatory Visit (INDEPENDENT_AMBULATORY_CARE_PROVIDER_SITE_OTHER): Payer: PPO | Admitting: *Deleted

## 2016-04-07 DIAGNOSIS — I441 Atrioventricular block, second degree: Secondary | ICD-10-CM

## 2016-04-07 NOTE — Telephone Encounter (Signed)
LMOVM reminding pt to send remote transmission.   

## 2016-04-08 ENCOUNTER — Encounter: Payer: Self-pay | Admitting: Cardiology

## 2016-04-08 NOTE — Progress Notes (Signed)
Remote pacemaker transmission.   

## 2016-04-10 LAB — CUP PACEART REMOTE DEVICE CHECK
Battery Impedance: 751 Ohm
Battery Voltage: 2.78 V
Brady Statistic AP VP Percent: 56 %
Implantable Lead Implant Date: 20100217
Implantable Lead Location: 753860
Implantable Lead Model: 5076
Implantable Lead Model: 5076
Lead Channel Impedance Value: 435 Ohm
Lead Channel Impedance Value: 621 Ohm
Lead Channel Pacing Threshold Pulse Width: 0.4 ms
Lead Channel Setting Pacing Amplitude: 2.5 V
Lead Channel Setting Pacing Pulse Width: 0.4 ms
Lead Channel Setting Sensing Sensitivity: 4 mV
MDC IDC LEAD IMPLANT DT: 20100217
MDC IDC LEAD LOCATION: 753859
MDC IDC MSMT BATTERY REMAINING LONGEVITY: 65 mo
MDC IDC MSMT LEADCHNL RA PACING THRESHOLD AMPLITUDE: 0.625 V
MDC IDC MSMT LEADCHNL RV PACING THRESHOLD AMPLITUDE: 0.375 V
MDC IDC MSMT LEADCHNL RV PACING THRESHOLD PULSEWIDTH: 0.4 ms
MDC IDC PG IMPLANT DT: 20100217
MDC IDC SESS DTM: 20171220194232
MDC IDC SET LEADCHNL RA PACING AMPLITUDE: 2 V
MDC IDC STAT BRADY AP VS PERCENT: 0 %
MDC IDC STAT BRADY AS VP PERCENT: 43 %
MDC IDC STAT BRADY AS VS PERCENT: 0 %

## 2016-04-23 DIAGNOSIS — H35033 Hypertensive retinopathy, bilateral: Secondary | ICD-10-CM | POA: Diagnosis not present

## 2016-04-23 DIAGNOSIS — H35372 Puckering of macula, left eye: Secondary | ICD-10-CM | POA: Diagnosis not present

## 2016-04-23 DIAGNOSIS — H353134 Nonexudative age-related macular degeneration, bilateral, advanced atrophic with subfoveal involvement: Secondary | ICD-10-CM | POA: Diagnosis not present

## 2016-06-12 ENCOUNTER — Other Ambulatory Visit: Payer: Self-pay | Admitting: Internal Medicine

## 2016-06-12 DIAGNOSIS — Z1231 Encounter for screening mammogram for malignant neoplasm of breast: Secondary | ICD-10-CM

## 2016-06-30 DIAGNOSIS — H00024 Hordeolum internum left upper eyelid: Secondary | ICD-10-CM | POA: Diagnosis not present

## 2016-07-03 DIAGNOSIS — F419 Anxiety disorder, unspecified: Secondary | ICD-10-CM | POA: Diagnosis not present

## 2016-07-03 DIAGNOSIS — I10 Essential (primary) hypertension: Secondary | ICD-10-CM | POA: Diagnosis not present

## 2016-07-03 DIAGNOSIS — Z6841 Body Mass Index (BMI) 40.0 and over, adult: Secondary | ICD-10-CM | POA: Diagnosis not present

## 2016-07-03 DIAGNOSIS — N183 Chronic kidney disease, stage 3 (moderate): Secondary | ICD-10-CM | POA: Diagnosis not present

## 2016-07-06 ENCOUNTER — Ambulatory Visit (INDEPENDENT_AMBULATORY_CARE_PROVIDER_SITE_OTHER): Payer: PPO | Admitting: Internal Medicine

## 2016-07-06 ENCOUNTER — Encounter: Payer: Self-pay | Admitting: Internal Medicine

## 2016-07-06 VITALS — BP 130/78 | HR 84 | Ht 60.0 in | Wt 215.0 lb

## 2016-07-06 DIAGNOSIS — I10 Essential (primary) hypertension: Secondary | ICD-10-CM

## 2016-07-06 DIAGNOSIS — I441 Atrioventricular block, second degree: Secondary | ICD-10-CM | POA: Diagnosis not present

## 2016-07-06 DIAGNOSIS — Z950 Presence of cardiac pacemaker: Secondary | ICD-10-CM

## 2016-07-06 LAB — CUP PACEART INCLINIC DEVICE CHECK
Brady Statistic AP VP Percent: 53 %
Brady Statistic AP VS Percent: 3 %
Implantable Lead Implant Date: 20100217
Implantable Lead Implant Date: 20100217
Implantable Lead Location: 753860
Implantable Lead Model: 5076
Implantable Pulse Generator Implant Date: 20100217
Lead Channel Impedance Value: 425 Ohm
Lead Channel Pacing Threshold Amplitude: 0.5 V
Lead Channel Pacing Threshold Amplitude: 0.5 V
Lead Channel Pacing Threshold Pulse Width: 0.4 ms
Lead Channel Sensing Intrinsic Amplitude: 11.2 mV
Lead Channel Sensing Intrinsic Amplitude: 2.8 mV
Lead Channel Setting Pacing Amplitude: 2 V
Lead Channel Setting Pacing Amplitude: 2.5 V
Lead Channel Setting Pacing Pulse Width: 0.4 ms
MDC IDC LEAD LOCATION: 753859
MDC IDC MSMT BATTERY IMPEDANCE: 800 Ohm
MDC IDC MSMT BATTERY REMAINING LONGEVITY: 64 mo
MDC IDC MSMT BATTERY VOLTAGE: 2.78 V
MDC IDC MSMT LEADCHNL RA PACING THRESHOLD AMPLITUDE: 0.625 V
MDC IDC MSMT LEADCHNL RA PACING THRESHOLD AMPLITUDE: 0.75 V
MDC IDC MSMT LEADCHNL RA PACING THRESHOLD PULSEWIDTH: 0.4 ms
MDC IDC MSMT LEADCHNL RA PACING THRESHOLD PULSEWIDTH: 0.4 ms
MDC IDC MSMT LEADCHNL RV IMPEDANCE VALUE: 634 Ohm
MDC IDC MSMT LEADCHNL RV PACING THRESHOLD PULSEWIDTH: 0.4 ms
MDC IDC SESS DTM: 20180319124128
MDC IDC SET LEADCHNL RV SENSING SENSITIVITY: 2.8 mV
MDC IDC STAT BRADY AS VP PERCENT: 42 %
MDC IDC STAT BRADY AS VS PERCENT: 1 %

## 2016-07-06 NOTE — Patient Instructions (Signed)
Medication Instructions:  Your physician recommends that you continue on your current medications as directed. Please refer to the Current Medication list given to you today.   Labwork: None ordered   Testing/Procedures: None ordered   Follow-Up: Your physician wants you to follow-up in: 12 months with Tommye Standard, PA. You will receive a reminder letter in the mail two months in advance. If you don't receive a letter, please call our office to schedule the follow-up appointment.   Remote monitoring is used to monitor your Pacemaker from home. This monitoring reduces the number of office visits required to check your device to one time per year. It allows [place] to keep an eye on the functioning of your device to ensure it is working properly. You are scheduled for a device check from home on 10/05/16. You may send your transmission at any time that day. If you have a wireless device, the transmission will be sent automatically. After your physician reviews your transmission, you will receive a postcard with your next transmission date.   Any Other Special Instructions Will Be Listed Below (If Applicable).     If you need a refill on your cardiac medications before your next appointment, please call your pharmacy.

## 2016-07-06 NOTE — Progress Notes (Signed)
Electrophysiology Office Note   Date:  07/06/2016   ID:  [name], DOB 02-05-1935, MRN [id number]  PCP:  Donnajean Lopes, MD   Primary Electrophysiologist: [name], MD    Chief Complaint  Patient presents with  . Follow-up    Mobitz II AVB     History of Present Illness: [name] is a 82 y.o. female who presents today for electrophysiology evaluation.   She has done well since her last visit.  She spends most of her time caring for her spouse who now has parkinsonism.  He is confused and up frequently at night.  She is not sleeping well. Today, she denies symptoms of palpitations, chest pain, shortness of breath, orthopnea, PND, lower extremity edema, claudication, dizziness, presyncope, syncope, bleeding, or neurologic sequela. The patient is tolerating medications without difficulties and is otherwise without complaint today.    Past Medical History:  Diagnosis Date  . Abnormal heart rhythm   . Anemia    hx of not recent   . Atypical chest pain   . Cardiomegaly   . Chronic headaches   . Chronic kidney disease   . Classic migraine with aura 12/17/2014  . Depression   . Dysrhythmia    HX OF MOBITZ 2 AV BLOCK AND FUNCTIONING PACEMAKER -DR. Claressa Hughley FOLLOWS PT'S PACEMAKER CARE  . GERD (gastroesophageal reflux disease)   . Headache(784.0)    PT STATES HER CHRONIC H/A'S HAVE RESOLVED  . Hypercholesteremia   . Hyperlipidemia   . Hypertension   . Obstructive sleep apnea on CPAP   . Osteoarthritis   . Pacemaker   . Recurrent upper respiratory infection (URI)    MARCH 2013 DX WITH BRONCHITIS--PT STATES RESOLVED--DOES HAVE SOME ALLERGIES AT PRESENT  . Second degree Mobitz II AV block    s/p PPM by Dr Verlon Setting 2010  . Sleep apnea    on CPAP-uses [facility]  . Urinary incontinence    Past Surgical History:  Procedure Laterality Date  . CARPAL TUNNEL RELEASE Bilateral   . CATARACT EXTRACTION Left   . CESAREAN SECTION    . FOOT SURGERY    . HAND SURGERY     . KNEE CLOSED REDUCTION  11/30/2011   Procedure: CLOSED MANIPULATION KNEE;  Surgeon: Gearlean Alf, MD;  Location: [facility];  Service: Orthopedics;  Laterality: Left;  . KNEE SURGERY     Arthroscopic surg  . PACEMAKER INSERTION  2010   dual-chamber (MDT) implanted by Dr Verlon Setting  . TOTAL KNEE ARTHROPLASTY Bilateral    right  . TOTAL KNEE ARTHROPLASTY  08/24/2011   Procedure: TOTAL KNEE ARTHROPLASTY;  Surgeon: Gearlean Alf, MD;  Location: [facility];  Service: Orthopedics;  Laterality: Left;  . TUBAL LIGATION  1968     Current Outpatient Prescriptions  Medication Sig Dispense Refill  . amLODipine (NORVASC) 5 MG tablet Take 5 mg by mouth at bedtime.     Marland Kitchen aspirin 81 MG tablet Take 81 mg by mouth at bedtime.     Marland Kitchen atenolol (TENORMIN) 50 MG tablet Take 50 mg by mouth 2 (two) times daily. Takes at breakfast and at bedtime    . IRON PO Take 1 tablet by mouth every morning.    . Omega-3 Fatty Acids (FISH OIL) 1200 MG CAPS Take 1,200 mg by mouth every evening.    Marland Kitchen omeprazole (PRILOSEC) 20 MG capsule Take 20 mg by mouth every morning.     Marland Kitchen OVER THE COUNTER MEDICATION Take 2 tablets by mouth  2 (two) times daily. Macular degeneration supplement (AREDS 2)    . Probiotic Product (PROBIOTIC DAILY PO) Take 1 tablet by mouth daily.    . simvastatin (ZOCOR) 40 MG tablet Take 40 mg by mouth at bedtime.     Marland Kitchen telmisartan-hydrochlorothiazide (MICARDIS HCT) 80-12.5 MG per tablet Take 1 tablet by mouth daily with breakfast.      No current facility-administered medications for this visit.     Allergies:   Amoxicillin; Codeine; and Oxybutynin   Social History:  The patient  reports that she has never smoked. She has never used smokeless tobacco. She reports that she does not drink alcohol or use drugs.   Family History:  The patient's family history includes Allergies in her father; Breast cancer in her mother; Dementia in her mother; Diabetes in her mother; Emphysema in her father; Heart attack in her  father; Heart disease in her father and mother; Hypertension in her father and mother; Macular degeneration in her father and sister; Migraines in her daughter; Osteoarthritis in her mother.    ROS:  Please see the history of present illness.   All other systems are reviewed and negative.    PHYSICAL EXAM: VS:  BP 130/78   Pulse 84   Ht 5' (1.524 m)   Wt 215 lb (97.5 kg)   SpO2 97%   BMI 41.99 kg/m  , BMI Body mass index is 41.99 kg/m. GEN: Well nourished, well developed, in no acute distress  HEENT: + L eye sty (has been seen by ophtho already) Neck: no JVD, carotid bruits, or masses Cardiac: RRR; no murmurs, rubs, or gallops,trace edema  Respiratory:  clear to auscultation bilaterally, normal work of breathing GI: soft, nontender, nondistended, + BS MS: no deformity or atrophy  Skin: warm and dry, device pocket is well healed Neuro:  Strength and sensation are intact Psych: euthymic mood, full affect  Device interrogation is personally reviewed today in detail.  See PaceArt for details.  ekg today is personally reviewed and reveals AV paced rhythm  Lipid Panel     Component Value Date/Time   CHOL  06/04/2008 1430    171        ATP III CLASSIFICATION:  <200     mg/dL   Desirable  200-239  mg/dL   Borderline High  >=240    mg/dL   High          TRIG 144 06/04/2008 1430   HDL 47 06/04/2008 1430   CHOLHDL 3.6 06/04/2008 1430   VLDL 29 06/04/2008 1430   LDLCALC  06/04/2008 1430    95        Total Cholesterol/HDL:CHD Risk Coronary Heart Disease Risk Table                     Men   Women  1/2 Average Risk   3.4   3.3  Average Risk       5.0   4.4  2 X Average Risk   9.6   7.1  3 X Average Risk  23.4   11.0        Use the calculated Patient Ratio above and the CHD Risk Table to determine the patient's CHD Risk.        ATP III CLASSIFICATION (LDL):  <100     mg/dL   Optimal  100-129  mg/dL   Near or Above  Optimal  130-159  mg/dL   Borderline   160-189  mg/dL   High  >190     mg/dL   Very High    Wt Readings from Last 3 Encounters:  07/06/16 215 lb (97.5 kg)  04/01/16 214 lb 6.4 oz (97.3 kg)  07/01/15 207 lb (93.9 kg)     ASSESSMENT AND PLAN:  1.  Mobitz II second degree AV block Normal pacemaker function See Pace Art report No changes today  2. HTN Controlled No changes today  carelink Return to see EP PA-C in 1 year   Signed, [name], MD  07/06/2016 12:31 PM     [place] [address] [place] [address] [phone] (office) [phone] (fax)

## 2016-07-09 ENCOUNTER — Ambulatory Visit (INDEPENDENT_AMBULATORY_CARE_PROVIDER_SITE_OTHER): Payer: PPO | Admitting: Ophthalmology

## 2016-07-10 DIAGNOSIS — H00024 Hordeolum internum left upper eyelid: Secondary | ICD-10-CM | POA: Diagnosis not present

## 2016-08-06 ENCOUNTER — Ambulatory Visit (INDEPENDENT_AMBULATORY_CARE_PROVIDER_SITE_OTHER): Payer: PPO | Admitting: Ophthalmology

## 2016-08-06 DIAGNOSIS — H35033 Hypertensive retinopathy, bilateral: Secondary | ICD-10-CM | POA: Diagnosis not present

## 2016-08-06 DIAGNOSIS — H353132 Nonexudative age-related macular degeneration, bilateral, intermediate dry stage: Secondary | ICD-10-CM | POA: Diagnosis not present

## 2016-08-06 DIAGNOSIS — H43813 Vitreous degeneration, bilateral: Secondary | ICD-10-CM

## 2016-08-06 DIAGNOSIS — H35372 Puckering of macula, left eye: Secondary | ICD-10-CM | POA: Diagnosis not present

## 2016-08-06 DIAGNOSIS — I10 Essential (primary) hypertension: Secondary | ICD-10-CM

## 2016-08-18 ENCOUNTER — Ambulatory Visit
Admission: RE | Admit: 2016-08-18 | Discharge: 2016-08-18 | Disposition: A | Discharge status: Home / Self Care | Payer: PPO | Source: Ambulatory Visit | Attending: Internal Medicine | Admitting: Internal Medicine

## 2016-08-18 DIAGNOSIS — Z1231 Encounter for screening mammogram for malignant neoplasm of breast: Secondary | ICD-10-CM

## 2016-09-08 ENCOUNTER — Telehealth: Payer: Self-pay | Admitting: Internal Medicine

## 2016-09-08 NOTE — Telephone Encounter (Signed)
Called patient back about her swelling in her RLE. Patient stated when she wears a compression stocking the swelling goes away. Patient stated this has been going on since she saw Dr. Rayann Heman in 07/06/16. Patient complained of SOB with activity and some pain in the RLE in the mornings. Patient denies chest pain, or redness in RLE. Patient's BP 140/72 and HR 81. Will consult DOD.

## 2016-09-08 NOTE — Telephone Encounter (Signed)
Patient recommended to follow-up with PCP. Patient verbalized understanding.

## 2016-09-08 NOTE — Telephone Encounter (Signed)
New message    Pt c/o swelling: STAT is pt has developed SOB within 24 hours  1. How long have you been experiencing swelling? For 2 weeks   2. Where is the swelling located?Right  Foot, ankles, leg   3.  Are you currently taking a "fluid pill"? No   4.  Are you currently SOB? A little - walking to mail box - does not last long  5.  Have you traveled recently? No

## 2016-09-16 DIAGNOSIS — I10 Essential (primary) hypertension: Secondary | ICD-10-CM | POA: Diagnosis not present

## 2016-09-16 DIAGNOSIS — R0609 Other forms of dyspnea: Secondary | ICD-10-CM | POA: Diagnosis not present

## 2016-09-16 DIAGNOSIS — M79661 Pain in right lower leg: Secondary | ICD-10-CM | POA: Diagnosis not present

## 2016-09-16 DIAGNOSIS — Z6841 Body Mass Index (BMI) 40.0 and over, adult: Secondary | ICD-10-CM | POA: Diagnosis not present

## 2016-09-16 DIAGNOSIS — R600 Localized edema: Secondary | ICD-10-CM | POA: Diagnosis not present

## 2016-09-16 DIAGNOSIS — R05 Cough: Secondary | ICD-10-CM | POA: Diagnosis not present

## 2016-09-16 DIAGNOSIS — M7989 Other specified soft tissue disorders: Secondary | ICD-10-CM | POA: Diagnosis not present

## 2016-10-05 ENCOUNTER — Ambulatory Visit (INDEPENDENT_AMBULATORY_CARE_PROVIDER_SITE_OTHER): Payer: PPO | Admitting: *Deleted

## 2016-10-05 ENCOUNTER — Telehealth: Payer: Self-pay | Admitting: Cardiology

## 2016-10-05 DIAGNOSIS — I441 Atrioventricular block, second degree: Secondary | ICD-10-CM

## 2016-10-05 NOTE — Telephone Encounter (Signed)
Spoke with pt and reminded pt of remote transmission that is due today. Pt verbalized understanding.   

## 2016-10-06 LAB — CUP PACEART REMOTE DEVICE CHECK
Battery Impedance: 901 Ohm
Battery Remaining Longevity: 60 mo
Battery Voltage: 2.78 V
Brady Statistic AP VP Percent: 53 %
Date Time Interrogation Session: 20180618183458
Implantable Lead Implant Date: 20100217
Implantable Lead Location: 753860
Implantable Lead Model: 5076
Implantable Pulse Generator Implant Date: 20100217
Lead Channel Impedance Value: 424 Ohm
Lead Channel Setting Pacing Amplitude: 2.5 V
Lead Channel Setting Pacing Pulse Width: 0.4 ms
Lead Channel Setting Sensing Sensitivity: 2.8 mV
MDC IDC LEAD IMPLANT DT: 20100217
MDC IDC LEAD LOCATION: 753859
MDC IDC MSMT LEADCHNL RA PACING THRESHOLD AMPLITUDE: 0.625 V
MDC IDC MSMT LEADCHNL RA PACING THRESHOLD PULSEWIDTH: 0.4 ms
MDC IDC MSMT LEADCHNL RV IMPEDANCE VALUE: 661 Ohm
MDC IDC MSMT LEADCHNL RV PACING THRESHOLD AMPLITUDE: 0.5 V
MDC IDC MSMT LEADCHNL RV PACING THRESHOLD PULSEWIDTH: 0.4 ms
MDC IDC SET LEADCHNL RA PACING AMPLITUDE: 2 V
MDC IDC STAT BRADY AP VS PERCENT: 1 %
MDC IDC STAT BRADY AS VP PERCENT: 46 %
MDC IDC STAT BRADY AS VS PERCENT: 0 %

## 2016-10-06 NOTE — Progress Notes (Signed)
Remote pacemaker transmission.   

## 2016-10-08 ENCOUNTER — Encounter: Payer: Self-pay | Admitting: Cardiology

## 2016-12-22 DIAGNOSIS — I10 Essential (primary) hypertension: Secondary | ICD-10-CM | POA: Diagnosis not present

## 2016-12-22 DIAGNOSIS — N390 Urinary tract infection, site not specified: Secondary | ICD-10-CM | POA: Diagnosis not present

## 2016-12-22 DIAGNOSIS — E784 Other hyperlipidemia: Secondary | ICD-10-CM | POA: Diagnosis not present

## 2016-12-22 DIAGNOSIS — R8299 Other abnormal findings in urine: Secondary | ICD-10-CM | POA: Diagnosis not present

## 2016-12-28 DIAGNOSIS — F418 Other specified anxiety disorders: Secondary | ICD-10-CM | POA: Diagnosis not present

## 2016-12-28 DIAGNOSIS — G4733 Obstructive sleep apnea (adult) (pediatric): Secondary | ICD-10-CM | POA: Diagnosis not present

## 2016-12-28 DIAGNOSIS — Z23 Encounter for immunization: Secondary | ICD-10-CM | POA: Diagnosis not present

## 2016-12-28 DIAGNOSIS — R5383 Other fatigue: Secondary | ICD-10-CM | POA: Diagnosis not present

## 2016-12-28 DIAGNOSIS — Z1389 Encounter for screening for other disorder: Secondary | ICD-10-CM | POA: Diagnosis not present

## 2016-12-28 DIAGNOSIS — K219 Gastro-esophageal reflux disease without esophagitis: Secondary | ICD-10-CM | POA: Diagnosis not present

## 2016-12-28 DIAGNOSIS — Z0000 Encounter for general adult medical examination without abnormal findings: Secondary | ICD-10-CM | POA: Diagnosis not present

## 2016-12-28 DIAGNOSIS — Z950 Presence of cardiac pacemaker: Secondary | ICD-10-CM | POA: Diagnosis not present

## 2016-12-28 DIAGNOSIS — M25561 Pain in right knee: Secondary | ICD-10-CM | POA: Diagnosis not present

## 2016-12-28 DIAGNOSIS — R200 Anesthesia of skin: Secondary | ICD-10-CM | POA: Diagnosis not present

## 2016-12-28 DIAGNOSIS — N183 Chronic kidney disease, stage 3 (moderate): Secondary | ICD-10-CM | POA: Diagnosis not present

## 2016-12-28 DIAGNOSIS — E784 Other hyperlipidemia: Secondary | ICD-10-CM | POA: Diagnosis not present

## 2016-12-31 DIAGNOSIS — Z1212 Encounter for screening for malignant neoplasm of rectum: Secondary | ICD-10-CM | POA: Diagnosis not present

## 2017-01-04 ENCOUNTER — Ambulatory Visit (INDEPENDENT_AMBULATORY_CARE_PROVIDER_SITE_OTHER): Payer: PPO | Admitting: *Deleted

## 2017-01-04 ENCOUNTER — Telehealth: Payer: Self-pay | Admitting: Cardiology

## 2017-01-04 DIAGNOSIS — I441 Atrioventricular block, second degree: Secondary | ICD-10-CM | POA: Diagnosis not present

## 2017-01-04 NOTE — Telephone Encounter (Signed)
LMOVM reminding pt to send remote transmission.   

## 2017-01-05 NOTE — Progress Notes (Signed)
Remote pacemaker transmission.   

## 2017-01-07 ENCOUNTER — Encounter: Payer: Self-pay | Admitting: Cardiology

## 2017-01-11 LAB — CUP PACEART REMOTE DEVICE CHECK
Battery Impedance: 977 Ohm
Brady Statistic AP VP Percent: 54 %
Brady Statistic AP VS Percent: 0 %
Brady Statistic AS VP Percent: 46 %
Brady Statistic AS VS Percent: 0 %
Date Time Interrogation Session: 20180917210131
Implantable Lead Implant Date: 20100217
Implantable Lead Implant Date: 20100217
Implantable Lead Location: 753860
Implantable Lead Model: 5076
Lead Channel Impedance Value: 435 Ohm
Lead Channel Impedance Value: 697 Ohm
Lead Channel Pacing Threshold Amplitude: 0.375 V
Lead Channel Pacing Threshold Amplitude: 0.625 V
Lead Channel Pacing Threshold Pulse Width: 0.4 ms
MDC IDC LEAD LOCATION: 753859
MDC IDC MSMT BATTERY REMAINING LONGEVITY: 58 mo
MDC IDC MSMT BATTERY VOLTAGE: 2.78 V
MDC IDC MSMT LEADCHNL RV PACING THRESHOLD PULSEWIDTH: 0.4 ms
MDC IDC PG IMPLANT DT: 20100217
MDC IDC SET LEADCHNL RA PACING AMPLITUDE: 2 V
MDC IDC SET LEADCHNL RV PACING AMPLITUDE: 2.5 V
MDC IDC SET LEADCHNL RV PACING PULSEWIDTH: 0.4 ms
MDC IDC SET LEADCHNL RV SENSING SENSITIVITY: 2.8 mV

## 2017-03-16 DIAGNOSIS — Z6841 Body Mass Index (BMI) 40.0 and over, adult: Secondary | ICD-10-CM | POA: Diagnosis not present

## 2017-03-16 DIAGNOSIS — R05 Cough: Secondary | ICD-10-CM | POA: Diagnosis not present

## 2017-03-16 DIAGNOSIS — J069 Acute upper respiratory infection, unspecified: Secondary | ICD-10-CM | POA: Diagnosis not present

## 2017-04-05 ENCOUNTER — Telehealth: Payer: Self-pay | Admitting: Cardiology

## 2017-04-05 ENCOUNTER — Ambulatory Visit (INDEPENDENT_AMBULATORY_CARE_PROVIDER_SITE_OTHER): Payer: PPO | Admitting: *Deleted

## 2017-04-05 DIAGNOSIS — I441 Atrioventricular block, second degree: Secondary | ICD-10-CM

## 2017-04-05 NOTE — Telephone Encounter (Signed)
Confirmed remote transmission w/ pt husband.   

## 2017-04-06 NOTE — Progress Notes (Signed)
Remote pacemaker transmission.   

## 2017-04-07 ENCOUNTER — Encounter: Payer: Self-pay | Admitting: Cardiology

## 2017-04-07 LAB — CUP PACEART REMOTE DEVICE CHECK
Battery Impedance: 1027 Ohm
Battery Remaining Longevity: 56 mo
Battery Voltage: 2.78 V
Brady Statistic AP VS Percent: 0 %
Implantable Lead Implant Date: 20100217
Implantable Lead Model: 5076
Implantable Lead Model: 5076
Implantable Pulse Generator Implant Date: 20100217
Lead Channel Pacing Threshold Pulse Width: 0.4 ms
Lead Channel Setting Pacing Amplitude: 2.5 V
Lead Channel Setting Pacing Pulse Width: 0.4 ms
Lead Channel Setting Sensing Sensitivity: 2.8 mV
MDC IDC LEAD IMPLANT DT: 20100217
MDC IDC LEAD LOCATION: 753859
MDC IDC LEAD LOCATION: 753860
MDC IDC MSMT LEADCHNL RA IMPEDANCE VALUE: 447 Ohm
MDC IDC MSMT LEADCHNL RA PACING THRESHOLD AMPLITUDE: 0.5 V
MDC IDC MSMT LEADCHNL RV IMPEDANCE VALUE: 657 Ohm
MDC IDC MSMT LEADCHNL RV PACING THRESHOLD AMPLITUDE: 0.5 V
MDC IDC MSMT LEADCHNL RV PACING THRESHOLD PULSEWIDTH: 0.4 ms
MDC IDC SESS DTM: 20181217212104
MDC IDC SET LEADCHNL RA PACING AMPLITUDE: 2 V
MDC IDC STAT BRADY AP VP PERCENT: 53 %
MDC IDC STAT BRADY AS VP PERCENT: 47 %
MDC IDC STAT BRADY AS VS PERCENT: 0 %

## 2017-04-29 DIAGNOSIS — H35372 Puckering of macula, left eye: Secondary | ICD-10-CM | POA: Diagnosis not present

## 2017-04-29 DIAGNOSIS — H35373 Puckering of macula, bilateral: Secondary | ICD-10-CM | POA: Diagnosis not present

## 2017-04-29 DIAGNOSIS — H35033 Hypertensive retinopathy, bilateral: Secondary | ICD-10-CM | POA: Diagnosis not present

## 2017-04-29 DIAGNOSIS — H353132 Nonexudative age-related macular degeneration, bilateral, intermediate dry stage: Secondary | ICD-10-CM | POA: Diagnosis not present

## 2017-06-10 ENCOUNTER — Encounter: Payer: Self-pay | Admitting: Internal Medicine

## 2017-06-10 ENCOUNTER — Ambulatory Visit: Payer: PPO | Admitting: Internal Medicine

## 2017-06-10 DIAGNOSIS — R05 Cough: Secondary | ICD-10-CM | POA: Diagnosis not present

## 2017-06-10 DIAGNOSIS — G4733 Obstructive sleep apnea (adult) (pediatric): Secondary | ICD-10-CM

## 2017-06-10 DIAGNOSIS — R053 Chronic cough: Secondary | ICD-10-CM

## 2017-06-10 NOTE — Assessment & Plan Note (Signed)
She sleeps much better using CPAP that is uncomfortable without it.  Download confirms excellent compliance and control.  No changes needed.  Continue auto 7-15.

## 2017-06-10 NOTE — Assessment & Plan Note (Signed)
Self-limited flare lasted a couple of months after a viral infection in 2018, now resolved.
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
## 2017-06-10 NOTE — Patient Instructions (Signed)
We can continue CPAP auto 7-15, mask of choice, humidifier, supplies, Airview  Please call if we can help

## 2017-06-28 DIAGNOSIS — J111 Influenza due to unidentified influenza virus with other respiratory manifestations: Secondary | ICD-10-CM | POA: Diagnosis not present

## 2017-06-28 DIAGNOSIS — J441 Chronic obstructive pulmonary disease with (acute) exacerbation: Secondary | ICD-10-CM | POA: Diagnosis not present

## 2017-06-28 DIAGNOSIS — I10 Essential (primary) hypertension: Secondary | ICD-10-CM | POA: Diagnosis not present

## 2017-06-28 DIAGNOSIS — G4733 Obstructive sleep apnea (adult) (pediatric): Secondary | ICD-10-CM | POA: Diagnosis not present

## 2017-06-28 DIAGNOSIS — Z6841 Body Mass Index (BMI) 40.0 and over, adult: Secondary | ICD-10-CM | POA: Diagnosis not present

## 2017-06-28 DIAGNOSIS — R05 Cough: Secondary | ICD-10-CM | POA: Diagnosis not present

## 2017-07-05 ENCOUNTER — Ambulatory Visit (INDEPENDENT_AMBULATORY_CARE_PROVIDER_SITE_OTHER): Payer: PPO | Admitting: *Deleted

## 2017-07-05 ENCOUNTER — Telehealth: Payer: Self-pay | Admitting: Cardiology

## 2017-07-05 DIAGNOSIS — I441 Atrioventricular block, second degree: Secondary | ICD-10-CM

## 2017-07-05 NOTE — Telephone Encounter (Signed)
Spoke with pt and reminded pt of remote transmission that is due today. Pt verbalized understanding.   

## 2017-07-06 NOTE — Progress Notes (Signed)
Remote pacemaker transmission.   

## 2017-07-07 ENCOUNTER — Ambulatory Visit: Payer: PPO | Admitting: Internal Medicine

## 2017-07-07 ENCOUNTER — Encounter: Payer: Self-pay | Admitting: Internal Medicine

## 2017-07-07 ENCOUNTER — Encounter: Payer: Self-pay | Admitting: Cardiology

## 2017-07-07 VITALS — BP 122/74 | HR 77 | Ht 61.0 in | Wt 209.0 lb

## 2017-07-07 DIAGNOSIS — Z950 Presence of cardiac pacemaker: Secondary | ICD-10-CM | POA: Diagnosis not present

## 2017-07-07 DIAGNOSIS — I10 Essential (primary) hypertension: Secondary | ICD-10-CM

## 2017-07-07 DIAGNOSIS — I441 Atrioventricular block, second degree: Secondary | ICD-10-CM

## 2017-07-07 LAB — CUP PACEART INCLINIC DEVICE CHECK
Date Time Interrogation Session: 20190320113442
Implantable Lead Implant Date: 20100217
Implantable Lead Location: 753859
Implantable Lead Model: 5076
Implantable Lead Model: 5076
MDC IDC LEAD IMPLANT DT: 20100217
MDC IDC LEAD LOCATION: 753860
MDC IDC PG IMPLANT DT: 20100217

## 2017-07-07 NOTE — Progress Notes (Signed)
PCP: Tracey Battles, MD   Primary EP:  Dr Lollie Sails [name] is a 83 y.o. female who presents today for routine electrophysiology followup.  Since last being seen in our clinic, the patient reports doing very well.  Today, she denies symptoms of palpitations, chest pain, shortness of breath,  lower extremity edema, dizziness, presyncope, or syncope.  The patient is otherwise without complaint today.   Past Medical History:  Diagnosis Date  . Abnormal heart rhythm   . Anemia    hx of not recent   . Atypical chest pain   . Cardiomegaly   . Chronic headaches   . Chronic kidney disease   . Classic migraine with aura 12/17/2014  . Depression   . Dysrhythmia    HX OF MOBITZ 2 AV BLOCK AND FUNCTIONING PACEMAKER -DR. Inis Levine FOLLOWS PT'S PACEMAKER CARE  . GERD (gastroesophageal reflux disease)   . Headache(784.0)    PT STATES HER CHRONIC H/A'S HAVE RESOLVED  . Hypercholesteremia   . Hyperlipidemia   . Hypertension   . Obstructive sleep apnea on CPAP   . Osteoarthritis   . Pacemaker   . Recurrent upper respiratory infection (URI)    MARCH 2013 DX WITH BRONCHITIS--PT STATES RESOLVED--DOES HAVE SOME ALLERGIES AT PRESENT  . Second degree Mobitz II AV block    s/p PPM by Dr [name] 2010  . Sleep apnea    on CPAP-uses [facility]  . Urinary incontinence    Past Surgical History:  Procedure Laterality Date  . CARPAL TUNNEL RELEASE Bilateral   . CATARACT EXTRACTION Left   . CESAREAN SECTION    . FOOT SURGERY    . HAND SURGERY    . KNEE CLOSED REDUCTION  11/30/2011   Procedure: CLOSED MANIPULATION KNEE;  Surgeon: Tracey Alf, MD;  Location: [facility];  Service: Orthopedics;  Laterality: Left;  . KNEE SURGERY     Arthroscopic surg  . PACEMAKER INSERTION  2010   dual-chamber (MDT) implanted by Dr [name]  . TOTAL KNEE ARTHROPLASTY Bilateral    right  . TOTAL KNEE ARTHROPLASTY  08/24/2011   Procedure: TOTAL KNEE ARTHROPLASTY;  Surgeon: Tracey Alf, MD;  Location: [facility];   Service: Orthopedics;  Laterality: Left;  . TUBAL LIGATION  1968    ROS- all systems are reviewed and negative except as per HPI above  Current Outpatient Medications  Medication Sig Dispense Refill  . amLODipine (NORVASC) 5 MG tablet Take 5 mg by mouth at bedtime.     Marland Kitchen aspirin 81 MG tablet Take 81 mg by mouth at bedtime.     Marland Kitchen atenolol (TENORMIN) 50 MG tablet Take 50 mg by mouth 2 (two) times daily. Takes at breakfast and at bedtime    . fluticasone (FLONASE) 50 MCG/ACT nasal spray as directed.  0  . IRON PO Take 1 tablet by mouth every morning.    . Omega-3 Fatty Acids (FISH OIL) 1200 MG CAPS Take 1,200 mg by mouth every evening.    Marland Kitchen omeprazole (PRILOSEC) 20 MG capsule Take 20 mg by mouth every morning.     Marland Kitchen OVER THE COUNTER MEDICATION Take 2 tablets by mouth 2 (two) times daily. Macular degeneration supplement (AREDS 2)    . oxybutynin (DITROPAN) 5 MG tablet Take 1 tablet by mouth daily.  0  . simvastatin (ZOCOR) 40 MG tablet Take 40 mg by mouth at bedtime.     Marland Kitchen telmisartan-hydrochlorothiazide (MICARDIS HCT) 80-12.5 MG per tablet Take 1 tablet by  mouth daily with breakfast.      No current facility-administered medications for this visit.     Physical Exam: Vitals:   07/07/17 1004  BP: 122/74  Pulse: 77  SpO2: 97%  Weight: 209 lb (94.8 kg)  Height: 5\' 1"  (1.549 m)   GEN- The patient is overweight appearing, alert and oriented x 3 today.   Head- normocephalic, atraumatic Eyes-  Sclera clear, conjunctiva pink Ears- hearing intact Oropharynx- clear Lungs- Clear to ausculation bilaterally, normal work of breathing Chest- pacemaker pocket is well healed Heart- Regular rate and rhythm, no murmurs, rubs or gallops, PMI not laterally displaced GI- soft, NT, ND, + BS Extremities- no clubbing, cyanosis, or edema  Pacemaker interrogation- reviewed in detail today,  See PACEART report  ekg ordered today is personally reviewed and reveals AV paced rhythm   Assessment and  Plan:  1. Symptomatic mobitz II heart block Normal pacemaker function See Pace Art report No changes today  2. HTN Stable No change required today Stop asa today  Carelink Return to see EP PA every year  Tracey Grayer MD, [facility] 07/07/2017 10:07 AM

## 2017-07-07 NOTE — Patient Instructions (Addendum)
Medication Instructions:  Your physician has recommended you make the following change in your medication:  1.  Stop taking aspirin  Labwork: None ordered.  Testing/Procedures: None ordered.  Follow-Up: Your physician wants you to follow-up in: one year with Tommye Standard, PA.   You will receive a reminder letter in the mail two months in advance. If you don't receive a letter, please call our office to schedule the follow-up appointment.  Remote monitoring is used to monitor your Pacemaker from home. This monitoring reduces the number of office visits required to check your device to one time per year. It allows [place] to keep an eye on the functioning of your device to ensure it is working properly. You are scheduled for a device check from home on 10/04/2017. You may send your transmission at any time that day. If you have a wireless device, the transmission will be sent automatically. After your physician reviews your transmission, you will receive a postcard with your next transmission date.  Any Other Special Instructions Will Be Listed Below (If Applicable).  If you need a refill on your cardiac medications before your next appointment, please call your pharmacy.

## 2017-07-09 ENCOUNTER — Other Ambulatory Visit: Payer: Self-pay | Admitting: Internal Medicine

## 2017-07-09 DIAGNOSIS — Z1231 Encounter for screening mammogram for malignant neoplasm of breast: Secondary | ICD-10-CM

## 2017-07-13 LAB — CUP PACEART REMOTE DEVICE CHECK
Battery Impedance: 1182 Ohm
Brady Statistic AP VP Percent: 52 %
Brady Statistic AP VS Percent: 0 %
Brady Statistic AS VS Percent: 0 %
Date Time Interrogation Session: 20190318200722
Implantable Lead Implant Date: 20100217
Implantable Lead Location: 753859
Implantable Lead Model: 5076
Implantable Lead Model: 5076
Lead Channel Impedance Value: 686 Ohm
Lead Channel Pacing Threshold Amplitude: 0.375 V
Lead Channel Pacing Threshold Pulse Width: 0.4 ms
Lead Channel Pacing Threshold Pulse Width: 0.4 ms
Lead Channel Setting Pacing Amplitude: 2 V
Lead Channel Setting Sensing Sensitivity: 2.8 mV
MDC IDC LEAD IMPLANT DT: 20100217
MDC IDC LEAD LOCATION: 753860
MDC IDC MSMT BATTERY REMAINING LONGEVITY: 51 mo
MDC IDC MSMT BATTERY VOLTAGE: 2.77 V
MDC IDC MSMT LEADCHNL RA IMPEDANCE VALUE: 459 Ohm
MDC IDC MSMT LEADCHNL RA PACING THRESHOLD AMPLITUDE: 0.625 V
MDC IDC PG IMPLANT DT: 20100217
MDC IDC SET LEADCHNL RV PACING AMPLITUDE: 2.5 V
MDC IDC SET LEADCHNL RV PACING PULSEWIDTH: 0.4 ms
MDC IDC STAT BRADY AS VP PERCENT: 47 %

## 2017-07-30 ENCOUNTER — Ambulatory Visit (INDEPENDENT_AMBULATORY_CARE_PROVIDER_SITE_OTHER): Payer: PPO | Admitting: Ophthalmology

## 2017-07-30 DIAGNOSIS — H353132 Nonexudative age-related macular degeneration, bilateral, intermediate dry stage: Secondary | ICD-10-CM | POA: Diagnosis not present

## 2017-07-30 DIAGNOSIS — H35033 Hypertensive retinopathy, bilateral: Secondary | ICD-10-CM | POA: Diagnosis not present

## 2017-07-30 DIAGNOSIS — I10 Essential (primary) hypertension: Secondary | ICD-10-CM | POA: Diagnosis not present

## 2017-07-30 DIAGNOSIS — H35372 Puckering of macula, left eye: Secondary | ICD-10-CM | POA: Diagnosis not present

## 2017-07-30 DIAGNOSIS — H43813 Vitreous degeneration, bilateral: Secondary | ICD-10-CM | POA: Diagnosis not present

## 2017-08-05 ENCOUNTER — Ambulatory Visit (INDEPENDENT_AMBULATORY_CARE_PROVIDER_SITE_OTHER): Payer: PPO

## 2017-08-05 ENCOUNTER — Ambulatory Visit: Payer: PPO | Admitting: Podiatry

## 2017-08-05 ENCOUNTER — Encounter: Payer: Self-pay | Admitting: Podiatry

## 2017-08-05 DIAGNOSIS — M779 Enthesopathy, unspecified: Secondary | ICD-10-CM

## 2017-08-05 DIAGNOSIS — M7751 Other enthesopathy of right foot: Secondary | ICD-10-CM | POA: Diagnosis not present

## 2017-08-05 DIAGNOSIS — G629 Polyneuropathy, unspecified: Secondary | ICD-10-CM | POA: Diagnosis not present

## 2017-08-05 MED ORDER — TRIAMCINOLONE ACETONIDE 10 MG/ML IJ SUSP
10.0000 mg | Freq: Once | INTRAMUSCULAR | Status: AC
  Administered 2017-08-05: 10 mg

## 2017-08-06 ENCOUNTER — Ambulatory Visit (INDEPENDENT_AMBULATORY_CARE_PROVIDER_SITE_OTHER): Payer: PPO | Admitting: Ophthalmology

## 2017-08-08 NOTE — Progress Notes (Signed)
Subjective:   Patient ID: [name], female   DOB: 83 y.o.   MRN: [id number]   HPI Patient presents stating she started to develop pain on the top of the right foot has also has concerns about neuropathy bilateral that is been present for a while   ROS      Objective:  Physical Exam  Neurovascular status intact with patient's right foot showing inflammation in the extensor tendon complex and patient has moderate diminishment of sharp dull vibratory bilateral     Assessment:  Tendinitis dorsal right foot with inflammation with moderate neuropathic symptomatology bilateral     Plan:  H&P both conditions reviewed and today I injected the dorsal tendon complex 3 mg Kenalog 5 mg Xylocaine advised on heat ice therapy discussed neuropathy and possible usage of gabapentin which I educated her on at the current time.  Reappoint to recheck  X-ray indicates there is some spurring around the midtarsal joint right localized in nature with no other advanced pathology noted

## 2017-08-18 DIAGNOSIS — M779 Enthesopathy, unspecified: Secondary | ICD-10-CM | POA: Diagnosis not present

## 2017-08-18 DIAGNOSIS — Z6841 Body Mass Index (BMI) 40.0 and over, adult: Secondary | ICD-10-CM | POA: Diagnosis not present

## 2017-08-19 ENCOUNTER — Ambulatory Visit
Admission: RE | Admit: 2017-08-19 | Discharge: 2017-08-19 | Disposition: A | Discharge status: Home / Self Care | Payer: PPO | Source: Ambulatory Visit | Attending: Internal Medicine | Admitting: Internal Medicine

## 2017-08-19 DIAGNOSIS — Z1231 Encounter for screening mammogram for malignant neoplasm of breast: Secondary | ICD-10-CM | POA: Diagnosis not present

## 2017-10-04 ENCOUNTER — Ambulatory Visit (INDEPENDENT_AMBULATORY_CARE_PROVIDER_SITE_OTHER): Payer: PPO | Admitting: *Deleted

## 2017-10-04 DIAGNOSIS — I441 Atrioventricular block, second degree: Secondary | ICD-10-CM | POA: Diagnosis not present

## 2017-10-05 ENCOUNTER — Encounter: Payer: Self-pay | Admitting: Cardiology

## 2017-10-05 LAB — CUP PACEART REMOTE DEVICE CHECK
Battery Impedance: 1262 Ohm
Battery Voltage: 2.77 V
Brady Statistic AP VP Percent: 40 %
Brady Statistic AP VS Percent: 0 %
Brady Statistic AS VP Percent: 60 %
Brady Statistic AS VS Percent: 0 %
Implantable Lead Implant Date: 20100217
Implantable Lead Location: 753860
Implantable Lead Model: 5076
Implantable Lead Model: 5076
Lead Channel Impedance Value: 435 Ohm
Lead Channel Impedance Value: 665 Ohm
Lead Channel Pacing Threshold Amplitude: 0.375 V
Lead Channel Pacing Threshold Amplitude: 0.625 V
Lead Channel Pacing Threshold Pulse Width: 0.4 ms
Lead Channel Setting Pacing Amplitude: 2.5 V
MDC IDC LEAD IMPLANT DT: 20100217
MDC IDC LEAD LOCATION: 753859
MDC IDC MSMT BATTERY REMAINING LONGEVITY: 50 mo
MDC IDC MSMT LEADCHNL RV PACING THRESHOLD PULSEWIDTH: 0.4 ms
MDC IDC PG IMPLANT DT: 20100217
MDC IDC SESS DTM: 20190617144650
MDC IDC SET LEADCHNL RA PACING AMPLITUDE: 2 V
MDC IDC SET LEADCHNL RV PACING PULSEWIDTH: 0.4 ms
MDC IDC SET LEADCHNL RV SENSING SENSITIVITY: 2.8 mV

## 2017-10-05 NOTE — Progress Notes (Signed)
Remote pacemaker transmission.   

## 2017-10-18 DIAGNOSIS — D1801 Hemangioma of skin and subcutaneous tissue: Secondary | ICD-10-CM | POA: Diagnosis not present

## 2017-10-18 DIAGNOSIS — L814 Other melanin hyperpigmentation: Secondary | ICD-10-CM | POA: Diagnosis not present

## 2017-10-18 DIAGNOSIS — L570 Actinic keratosis: Secondary | ICD-10-CM | POA: Diagnosis not present

## 2017-10-18 DIAGNOSIS — C4442 Squamous cell carcinoma of skin of scalp and neck: Secondary | ICD-10-CM | POA: Diagnosis not present

## 2017-10-18 DIAGNOSIS — Z85828 Personal history of other malignant neoplasm of skin: Secondary | ICD-10-CM | POA: Diagnosis not present

## 2017-10-18 DIAGNOSIS — L820 Inflamed seborrheic keratosis: Secondary | ICD-10-CM | POA: Diagnosis not present

## 2017-10-18 DIAGNOSIS — L821 Other seborrheic keratosis: Secondary | ICD-10-CM | POA: Diagnosis not present

## 2017-10-18 DIAGNOSIS — L918 Other hypertrophic disorders of the skin: Secondary | ICD-10-CM | POA: Diagnosis not present

## 2018-01-03 ENCOUNTER — Telehealth: Payer: Self-pay | Admitting: Cardiology

## 2018-01-03 ENCOUNTER — Ambulatory Visit (INDEPENDENT_AMBULATORY_CARE_PROVIDER_SITE_OTHER): Payer: PPO | Admitting: *Deleted

## 2018-01-03 DIAGNOSIS — I441 Atrioventricular block, second degree: Secondary | ICD-10-CM | POA: Diagnosis not present

## 2018-01-03 NOTE — Telephone Encounter (Signed)
Spoke with pt and reminded pt of remote transmission that is due today. Pt verbalized understanding.   

## 2018-01-04 NOTE — Progress Notes (Signed)
Remote pacemaker transmission.   

## 2018-01-20 DIAGNOSIS — I10 Essential (primary) hypertension: Secondary | ICD-10-CM | POA: Diagnosis not present

## 2018-01-20 DIAGNOSIS — R82998 Other abnormal findings in urine: Secondary | ICD-10-CM | POA: Diagnosis not present

## 2018-01-20 DIAGNOSIS — E7849 Other hyperlipidemia: Secondary | ICD-10-CM | POA: Diagnosis not present

## 2018-01-22 DIAGNOSIS — Z23 Encounter for immunization: Secondary | ICD-10-CM | POA: Diagnosis not present

## 2018-01-24 DIAGNOSIS — L910 Hypertrophic scar: Secondary | ICD-10-CM | POA: Diagnosis not present

## 2018-01-24 DIAGNOSIS — Z85828 Personal history of other malignant neoplasm of skin: Secondary | ICD-10-CM | POA: Diagnosis not present

## 2018-01-24 DIAGNOSIS — L821 Other seborrheic keratosis: Secondary | ICD-10-CM | POA: Diagnosis not present

## 2018-01-26 LAB — CUP PACEART REMOTE DEVICE CHECK
Battery Impedance: 1342 Ohm
Battery Remaining Longevity: 48 mo
Brady Statistic AP VP Percent: 39 %
Brady Statistic AS VP Percent: 61 %
Date Time Interrogation Session: 20190916185355
Implantable Lead Implant Date: 20100217
Implantable Lead Location: 753860
Implantable Lead Model: 5076
Implantable Pulse Generator Implant Date: 20100217
Lead Channel Impedance Value: 442 Ohm
Lead Channel Pacing Threshold Amplitude: 0.625 V
Lead Channel Setting Pacing Amplitude: 2 V
Lead Channel Setting Pacing Amplitude: 2.5 V
Lead Channel Setting Pacing Pulse Width: 0.4 ms
MDC IDC LEAD IMPLANT DT: 20100217
MDC IDC LEAD LOCATION: 753859
MDC IDC MSMT BATTERY VOLTAGE: 2.76 V
MDC IDC MSMT LEADCHNL RA PACING THRESHOLD PULSEWIDTH: 0.4 ms
MDC IDC MSMT LEADCHNL RV IMPEDANCE VALUE: 678 Ohm
MDC IDC MSMT LEADCHNL RV PACING THRESHOLD AMPLITUDE: 0.375 V
MDC IDC MSMT LEADCHNL RV PACING THRESHOLD PULSEWIDTH: 0.4 ms
MDC IDC SET LEADCHNL RV SENSING SENSITIVITY: 2.8 mV
MDC IDC STAT BRADY AP VS PERCENT: 0 %
MDC IDC STAT BRADY AS VS PERCENT: 0 %

## 2018-01-27 DIAGNOSIS — Z1389 Encounter for screening for other disorder: Secondary | ICD-10-CM | POA: Diagnosis not present

## 2018-01-27 DIAGNOSIS — Z950 Presence of cardiac pacemaker: Secondary | ICD-10-CM | POA: Diagnosis not present

## 2018-01-27 DIAGNOSIS — F418 Other specified anxiety disorders: Secondary | ICD-10-CM | POA: Diagnosis not present

## 2018-01-27 DIAGNOSIS — M79671 Pain in right foot: Secondary | ICD-10-CM | POA: Diagnosis not present

## 2018-01-27 DIAGNOSIS — N183 Chronic kidney disease, stage 3 (moderate): Secondary | ICD-10-CM | POA: Diagnosis not present

## 2018-01-27 DIAGNOSIS — N3281 Overactive bladder: Secondary | ICD-10-CM | POA: Diagnosis not present

## 2018-01-27 DIAGNOSIS — Z0000 Encounter for general adult medical examination without abnormal findings: Secondary | ICD-10-CM | POA: Diagnosis not present

## 2018-01-27 DIAGNOSIS — I10 Essential (primary) hypertension: Secondary | ICD-10-CM | POA: Diagnosis not present

## 2018-01-27 DIAGNOSIS — G4733 Obstructive sleep apnea (adult) (pediatric): Secondary | ICD-10-CM | POA: Diagnosis not present

## 2018-01-27 DIAGNOSIS — Z6841 Body Mass Index (BMI) 40.0 and over, adult: Secondary | ICD-10-CM | POA: Diagnosis not present

## 2018-01-27 DIAGNOSIS — E7849 Other hyperlipidemia: Secondary | ICD-10-CM | POA: Diagnosis not present

## 2018-01-28 DIAGNOSIS — Z1212 Encounter for screening for malignant neoplasm of rectum: Secondary | ICD-10-CM | POA: Diagnosis not present

## 2018-01-31 DIAGNOSIS — Z13820 Encounter for screening for osteoporosis: Secondary | ICD-10-CM | POA: Diagnosis not present

## 2018-04-04 ENCOUNTER — Ambulatory Visit (INDEPENDENT_AMBULATORY_CARE_PROVIDER_SITE_OTHER): Payer: PPO

## 2018-04-04 ENCOUNTER — Telehealth: Payer: Self-pay

## 2018-04-04 DIAGNOSIS — I441 Atrioventricular block, second degree: Secondary | ICD-10-CM | POA: Diagnosis not present

## 2018-04-04 NOTE — Telephone Encounter (Signed)
LMOVM reminding pt to send remote transmission.   

## 2018-04-05 ENCOUNTER — Encounter: Payer: Self-pay | Admitting: Cardiology

## 2018-04-05 NOTE — Progress Notes (Signed)
Remote pacemaker transmission.   

## 2018-05-15 LAB — CUP PACEART REMOTE DEVICE CHECK
Battery Impedance: 1423 Ohm
Battery Voltage: 2.77 V
Brady Statistic AP VS Percent: 0 %
Brady Statistic AS VS Percent: 0 %
Date Time Interrogation Session: 20191216202843
Implantable Lead Implant Date: 20100217
Implantable Lead Location: 753859
Implantable Lead Model: 5076
Implantable Lead Model: 5076
Lead Channel Impedance Value: 657 Ohm
Lead Channel Pacing Threshold Amplitude: 0.375 V
Lead Channel Pacing Threshold Pulse Width: 0.4 ms
Lead Channel Setting Pacing Pulse Width: 0.4 ms
Lead Channel Setting Sensing Sensitivity: 2.8 mV
MDC IDC LEAD IMPLANT DT: 20100217
MDC IDC LEAD LOCATION: 753860
MDC IDC MSMT BATTERY REMAINING LONGEVITY: 45 mo
MDC IDC MSMT LEADCHNL RA IMPEDANCE VALUE: 429 Ohm
MDC IDC MSMT LEADCHNL RA PACING THRESHOLD AMPLITUDE: 0.5 V
MDC IDC MSMT LEADCHNL RV PACING THRESHOLD PULSEWIDTH: 0.4 ms
MDC IDC PG IMPLANT DT: 20100217
MDC IDC SET LEADCHNL RA PACING AMPLITUDE: 2 V
MDC IDC SET LEADCHNL RV PACING AMPLITUDE: 2.5 V
MDC IDC STAT BRADY AP VP PERCENT: 39 %
MDC IDC STAT BRADY AS VP PERCENT: 61 %

## 2018-06-06 DIAGNOSIS — H35033 Hypertensive retinopathy, bilateral: Secondary | ICD-10-CM | POA: Diagnosis not present

## 2018-06-06 DIAGNOSIS — H35372 Puckering of macula, left eye: Secondary | ICD-10-CM | POA: Diagnosis not present

## 2018-06-06 DIAGNOSIS — H35373 Puckering of macula, bilateral: Secondary | ICD-10-CM | POA: Diagnosis not present

## 2018-06-06 DIAGNOSIS — H353132 Nonexudative age-related macular degeneration, bilateral, intermediate dry stage: Secondary | ICD-10-CM | POA: Diagnosis not present

## 2018-06-12 ENCOUNTER — Encounter: Payer: Self-pay | Admitting: Internal Medicine

## 2018-06-13 ENCOUNTER — Ambulatory Visit (INDEPENDENT_AMBULATORY_CARE_PROVIDER_SITE_OTHER)
Admission: RE | Admit: 2018-06-13 | Discharge: 2018-06-13 | Disposition: A | Discharge status: Home / Self Care | Payer: PPO | Source: Ambulatory Visit | Attending: Internal Medicine | Admitting: Internal Medicine

## 2018-06-13 ENCOUNTER — Encounter: Payer: Self-pay | Admitting: Internal Medicine

## 2018-06-13 ENCOUNTER — Ambulatory Visit (INDEPENDENT_AMBULATORY_CARE_PROVIDER_SITE_OTHER): Payer: PPO | Admitting: Internal Medicine

## 2018-06-13 VITALS — BP 134/74 | HR 84 | Ht 60.0 in | Wt 211.8 lb

## 2018-06-13 DIAGNOSIS — J209 Acute bronchitis, unspecified: Secondary | ICD-10-CM

## 2018-06-13 DIAGNOSIS — R05 Cough: Secondary | ICD-10-CM

## 2018-06-13 DIAGNOSIS — G4733 Obstructive sleep apnea (adult) (pediatric): Secondary | ICD-10-CM

## 2018-06-13 DIAGNOSIS — R053 Chronic cough: Secondary | ICD-10-CM

## 2018-06-13 MED ORDER — AZITHROMYCIN 250 MG PO TABS: ORAL_TABLET | ORAL | 0 refills | Status: DC
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
## 2018-06-13 NOTE — Assessment & Plan Note (Signed)
She continues to benefit with improved sleep.  Download confirms excellent compliance and control.  Her preference for using the machine without humidifier may contribute to the drying sensation and mucus in throat in the morning.

## 2018-06-13 NOTE — Patient Instructions (Signed)
Script sent for Zpak antibiotic  Order- CXR dx acute bronchitis  Ok to continue CPAP auto 7-15, mask of choice, humidifier, supplies, AirView/ card  Consider trying to use the humidiifer on your CPAP machine, to see if that helps the morning mucus in your throat. That mucus probably comes from the drying effect of your bladder pill.  It would be okay to just drink water in the morning to get rid of that feeling.

## 2018-06-13 NOTE — Assessment & Plan Note (Signed)
She describes brown mucus in her throat in the morning.  There may be a mild bronchitis which we will address with a Z-Pak.  I think mostly she is not swallowing much at night while wearing CPAP, is getting some drying from using CPAP without humidifier, and is getting some drying from her oxybutynin. Plan-we discussed use of humidifier if she wants to bother.  CXR because of mild coarseness to breath sounds in the right upper lobe.  Z-Pak.

## 2018-07-04 ENCOUNTER — Ambulatory Visit (INDEPENDENT_AMBULATORY_CARE_PROVIDER_SITE_OTHER): Payer: PPO | Admitting: *Deleted

## 2018-07-04 ENCOUNTER — Other Ambulatory Visit: Payer: Self-pay

## 2018-07-04 DIAGNOSIS — I441 Atrioventricular block, second degree: Secondary | ICD-10-CM

## 2018-07-05 LAB — CUP PACEART REMOTE DEVICE CHECK
Battery Remaining Longevity: 40 mo
Brady Statistic AP VS Percent: 0 %
Brady Statistic AS VS Percent: 0 %
Date Time Interrogation Session: 20200316182017
Implantable Lead Implant Date: 20100217
Implantable Lead Location: 753859
Implantable Lead Location: 753860
Implantable Pulse Generator Implant Date: 20100217
Lead Channel Impedance Value: 638 Ohm
Lead Channel Pacing Threshold Amplitude: 0.375 V
Lead Channel Pacing Threshold Pulse Width: 0.4 ms
Lead Channel Setting Pacing Amplitude: 2 V
Lead Channel Setting Sensing Sensitivity: 2.8 mV
MDC IDC LEAD IMPLANT DT: 20100217
MDC IDC MSMT BATTERY IMPEDANCE: 1612 Ohm
MDC IDC MSMT BATTERY VOLTAGE: 2.76 V
MDC IDC MSMT LEADCHNL RA IMPEDANCE VALUE: 436 Ohm
MDC IDC MSMT LEADCHNL RA PACING THRESHOLD AMPLITUDE: 0.625 V
MDC IDC MSMT LEADCHNL RA PACING THRESHOLD PULSEWIDTH: 0.4 ms
MDC IDC SET LEADCHNL RV PACING AMPLITUDE: 2.5 V
MDC IDC SET LEADCHNL RV PACING PULSEWIDTH: 0.4 ms
MDC IDC STAT BRADY AP VP PERCENT: 41 %
MDC IDC STAT BRADY AS VP PERCENT: 59 %

## 2018-07-08 ENCOUNTER — Encounter: Payer: Self-pay | Admitting: Internal Medicine

## 2018-07-12 ENCOUNTER — Encounter: Payer: Self-pay | Admitting: Cardiology

## 2018-07-12 NOTE — Progress Notes (Signed)
Remote pacemaker transmission.   

## 2018-07-18 ENCOUNTER — Encounter: Payer: PPO | Admitting: Internal Medicine

## 2018-08-08 ENCOUNTER — Encounter (INDEPENDENT_AMBULATORY_CARE_PROVIDER_SITE_OTHER): Payer: PPO | Admitting: Ophthalmology

## 2018-09-21 ENCOUNTER — Encounter (INDEPENDENT_AMBULATORY_CARE_PROVIDER_SITE_OTHER): Payer: PPO | Admitting: Ophthalmology

## 2018-09-21 ENCOUNTER — Other Ambulatory Visit: Payer: Self-pay

## 2018-09-21 DIAGNOSIS — I10 Essential (primary) hypertension: Secondary | ICD-10-CM | POA: Diagnosis not present

## 2018-09-21 DIAGNOSIS — H353132 Nonexudative age-related macular degeneration, bilateral, intermediate dry stage: Secondary | ICD-10-CM | POA: Diagnosis not present

## 2018-09-21 DIAGNOSIS — H35033 Hypertensive retinopathy, bilateral: Secondary | ICD-10-CM

## 2018-09-21 DIAGNOSIS — H43813 Vitreous degeneration, bilateral: Secondary | ICD-10-CM

## 2018-09-23 ENCOUNTER — Other Ambulatory Visit: Payer: Self-pay | Admitting: Internal Medicine

## 2018-09-23 DIAGNOSIS — Z1231 Encounter for screening mammogram for malignant neoplasm of breast: Secondary | ICD-10-CM

## 2018-10-03 ENCOUNTER — Ambulatory Visit (INDEPENDENT_AMBULATORY_CARE_PROVIDER_SITE_OTHER): Payer: PPO | Admitting: *Deleted

## 2018-10-03 DIAGNOSIS — I441 Atrioventricular block, second degree: Secondary | ICD-10-CM

## 2018-10-04 ENCOUNTER — Telehealth: Payer: Self-pay

## 2018-10-04 LAB — CUP PACEART REMOTE DEVICE CHECK
Battery Impedance: 1696 Ohm
Battery Remaining Longevity: 39 mo
Battery Voltage: 2.76 V
Brady Statistic AP VP Percent: 41 %
Brady Statistic AP VS Percent: 0 %
Brady Statistic AS VP Percent: 59 %
Brady Statistic AS VS Percent: 0 %
Date Time Interrogation Session: 20200616153402
Implantable Lead Implant Date: 20100217
Implantable Lead Implant Date: 20100217
Implantable Lead Location: 753859
Implantable Lead Location: 753860
Implantable Lead Model: 5076
Implantable Lead Model: 5076
Implantable Pulse Generator Implant Date: 20100217
Lead Channel Impedance Value: 460 Ohm
Lead Channel Impedance Value: 648 Ohm
Lead Channel Pacing Threshold Amplitude: 0.375 V
Lead Channel Pacing Threshold Amplitude: 0.625 V
Lead Channel Pacing Threshold Pulse Width: 0.4 ms
Lead Channel Pacing Threshold Pulse Width: 0.4 ms
Lead Channel Setting Pacing Amplitude: 2 V
Lead Channel Setting Pacing Amplitude: 2.5 V
Lead Channel Setting Pacing Pulse Width: 0.4 ms
Lead Channel Setting Sensing Sensitivity: 2.8 mV

## 2018-10-04 NOTE — Telephone Encounter (Signed)
Spoke with patient to remind of missed remote transmission 

## 2018-10-11 ENCOUNTER — Encounter: Payer: Self-pay | Admitting: Cardiology

## 2018-10-11 NOTE — Progress Notes (Signed)
Remote pacemaker transmission.   

## 2018-11-09 ENCOUNTER — Ambulatory Visit: Payer: PPO

## 2018-12-09 ENCOUNTER — Ambulatory Visit: Payer: PPO

## 2018-12-10 NOTE — Progress Notes (Signed)
Cardiology Office Note Date:  12/12/2018  Patient ID:  [name], [name] 06-Dec-1934, MRN [id number] PCP:  Leanna Battles, MD  Electrophjysiologist:  Dr. Rayann Heman     Chief Complaint: overdue annual visit  History of Present Illness: [name] is a 84 y.o. female with history of migraine HAs, HTN, HLD, OSA w/CPAP, advanced AVBlock w/PPM.  She comes in today to be seen for Dr. Rayann Heman.  Last seen by him March 2019, at that time, doing well, no changes were made to her meds or programming with plans to see EP APP annually with regular remotes.  She is doing fairly well, mentions that in the last few months her husband who sufferers with Parkinson's disease dementia has become bed bound and she is his sole caregiver.  This is physically and emotionally tolling on her.  She reports one of her son's is in the process of getting relocated and will be moving in to help care for him, this she hopes will be a big help to her (them).  She denies nay palpitations.  No dizziness, near syncope or syncope. She will occasionally get a chest pressure that resolves every time with drinking some coke and belching.  No CP other wies, no SOB, DOE.    Device information MDT dual chamber PPM, implanted 06/06/08 (Dr. Verlon Setting)   Past Medical History:  Diagnosis Date   Abnormal heart rhythm    Anemia    hx of not recent    Atypical chest pain    Cardiomegaly    Chronic headaches    Chronic kidney disease    Classic migraine with aura 12/17/2014   Depression    Dysrhythmia    HX OF MOBITZ 2 AV BLOCK AND FUNCTIONING PACEMAKER -DR. JAMES ALLRED FOLLOWS PT'S PACEMAKER CARE   GERD (gastroesophageal reflux disease)    Headache(784.0)    PT STATES HER CHRONIC H/A'S HAVE RESOLVED   Hypercholesteremia    Hyperlipidemia    Hypertension    Obstructive sleep apnea on CPAP    Osteoarthritis    Pacemaker    Recurrent upper respiratory infection (URI)    MARCH 2013 DX WITH BRONCHITIS--PT  STATES RESOLVED--DOES HAVE SOME ALLERGIES AT PRESENT   Second degree Mobitz II AV block    s/p PPM by Dr Verlon Setting 2010   Sleep apnea    on CPAP-uses [facility]   Urinary incontinence     Past Surgical History:  Procedure Laterality Date   CARPAL TUNNEL RELEASE Bilateral    CATARACT EXTRACTION Left    CESAREAN SECTION     FOOT SURGERY     HAND SURGERY     KNEE CLOSED REDUCTION  11/30/2011   Procedure: CLOSED MANIPULATION KNEE;  Surgeon: Gearlean Alf, MD;  Location: [facility];  Service: Orthopedics;  Laterality: Left;   KNEE SURGERY     Arthroscopic surg   PACEMAKER INSERTION  2010   dual-chamber (MDT) implanted by Dr Verlon Setting   TOTAL KNEE ARTHROPLASTY Bilateral    right   TOTAL KNEE ARTHROPLASTY  08/24/2011   Procedure: TOTAL KNEE ARTHROPLASTY;  Surgeon: Gearlean Alf, MD;  Location: [facility];  Service: Orthopedics;  Laterality: Left;   TUBAL LIGATION  1968    Current Outpatient Medications  Medication Sig Dispense Refill   amLODipine (NORVASC) 5 MG tablet Take 5 mg by mouth at bedtime.      atenolol (TENORMIN) 50 MG tablet Take 50 mg by mouth 2 (two) times daily. Takes at breakfast and at bedtime  fluticasone (FLONASE) 50 MCG/ACT nasal spray as directed.  0   IRON PO Take 1 tablet by mouth every morning.     Omega-3 Fatty Acids (FISH OIL) 1200 MG CAPS Take 1,200 mg by mouth every evening.     omeprazole (PRILOSEC) 20 MG capsule Take 20 mg by mouth every morning.      OVER THE COUNTER MEDICATION Take 1 tablet by mouth 2 (two) times daily. Macular degeneration supplement (AREDS 2)     oxybutynin (DITROPAN) 5 MG tablet Take 1 tablet by mouth daily.  0   simvastatin (ZOCOR) 40 MG tablet Take 40 mg by mouth at bedtime.      telmisartan-hydrochlorothiazide (MICARDIS HCT) 80-12.5 MG per tablet Take 1 tablet by mouth daily with breakfast.      No current facility-administered medications for this visit.     Allergies:   Amoxicillin and Codeine   Social History:   The patient  reports that she has never smoked. She has never used smokeless tobacco. She reports that she does not drink alcohol or use drugs.   Family History:  The patient's family history includes Allergies in her father; Breast cancer in her mother; Dementia in her mother; Diabetes in her mother; Emphysema in her father; Heart attack in her father; Heart disease in her father and mother; Hypertension in her father and mother; Macular degeneration in her father and sister; Migraines in her daughter; Osteoarthritis in her mother.  ROS:  Please see the history of present illness.  All other systems are reviewed and otherwise negative.   PHYSICAL EXAM:  VS:  BP (!) 128/58    Pulse 67    Ht 5' (1.524 m)    Wt 200 lb (90.7 kg)    BMI 39.06 kg/m  BMI: Body mass index is 39.06 kg/m. Well nourished, well developed, in no acute distress  HEENT: normocephalic, atraumatic  Neck: no JVD, carotid bruits or masses Cardiac:  RRR; no significant murmurs, no rubs, or gallops Lungs: CTA b/l, no wheezing, rhonchi or rales  Abd: soft, nontender MS: no deformity, age appropriate atrophy Ext: no edema  Skin: warm and dry, no rash Neuro:  No gross deficits appreciated Psych: euthymic mood, full affect  PPM site is stable, no tethering or discomfort   EKG:  Done today and reviewed by myself shows SR/VP PPM interrogation done today and reviewed by myself: battery and lead measurements are good, she VP at 40 today.  She had one AFib episode 9 minutes, no HVR.  She is having PVCs    06/04/08: TTE SUMMARY  - Overall left ventricular systolic function was normal. Left     ventricular ejection fraction was estimated to be 65 %. There     were no left ventricular regional wall motion abnormalities.     Left ventricular wall thickness was at the upper limits of     normal. Left ventricular diastolic function parameters were     normal.  - Aortic valve thickness was mildly increased.  There was trivial     aortic valvular regurgitation.  - There was mild mitral valvular regurgitation.      Recent Labs: No results found for requested labs within last 8760 hours.  No results found for requested labs within last 8760 hours.   CrCl cannot be calculated (Patient's most recent lab result is older than the maximum 21 days allowed.).   Wt Readings from Last 3 Encounters:  12/12/18 200 lb (90.7 kg)  06/13/18 211 lb 12.8  oz (96.1 kg)  07/07/17 209 lb (94.8 kg)     Other studies reviewed: Additional studies/records reviewed today include: summarized above  ASSESSMENT AND PLAN:  1. PPM     Intact function, no programming changes made  2. HTN     Looks ok, no changes  3. HLD     Deferred today  4. PVCs.       She has h/o PVCs by prior in-clinic check a year ago     She does not have palpitations     Frequency/burden appear much higher, pearticularly the last few months by remot checks     ? If 2/2 her recent increase in personal stress and ohysical work with caring for her husband             5. AFib     Single 9 minute episode last month <0.1% burden     Follow via her device, no a/c just yet   I discuss perhaps trying Metoprolol Succ instead of her atenolol, she feels well though and is reluctant to make any changes.  Will update her echo as a 1st place to start and get BMEt, mag level as well.     Disposition: F/u with me in 3 mo, sooner if needed.  If her echo notes change, I would be more inclined to press med change.  CP sounded very GI, though if echo changes would also consider ischemic w/u  Current medicines are reviewed at length with the patient today.  The patient did not have any concerns regarding medicines  Signed, Tommye Standard, PA-C 12/12/2018 1:29 PM     [address] [place] [address] [phone] (office)  [phone] (fax)

## 2018-12-12 ENCOUNTER — Ambulatory Visit: Payer: PPO | Admitting: Physician Assistant

## 2018-12-12 ENCOUNTER — Encounter: Payer: Self-pay | Admitting: Physician Assistant

## 2018-12-12 ENCOUNTER — Other Ambulatory Visit: Payer: Self-pay

## 2018-12-12 VITALS — BP 128/58 | HR 67 | Ht 60.0 in | Wt 200.0 lb

## 2018-12-12 DIAGNOSIS — Z950 Presence of cardiac pacemaker: Secondary | ICD-10-CM | POA: Diagnosis not present

## 2018-12-12 DIAGNOSIS — I10 Essential (primary) hypertension: Secondary | ICD-10-CM

## 2018-12-12 DIAGNOSIS — I493 Ventricular premature depolarization: Secondary | ICD-10-CM | POA: Diagnosis not present

## 2018-12-12 DIAGNOSIS — I441 Atrioventricular block, second degree: Secondary | ICD-10-CM | POA: Diagnosis not present

## 2018-12-12 DIAGNOSIS — I480 Paroxysmal atrial fibrillation: Secondary | ICD-10-CM | POA: Diagnosis not present

## 2018-12-12 NOTE — Patient Instructions (Addendum)
Medication Instructions:    Your physician recommends that you continue on your current medications as directed. Please refer to the Current Medication list given to you today.  If you need a refill on your cardiac medications before your next appointment, please call your pharmacy.   Lab work:  BMET MAG TODAY   If you have labs (blood work) drawn today and your tests are completely normal, you will receive your results only by: Marland Kitchen MyChart Message (if you have MyChart) OR . A paper copy in the mail If you have any lab test that is abnormal or we need to change your treatment, we will call you to review the results.  Testing/Procedures: Your physician has requested that you have an echocardiogram. Echocardiography is a painless test that uses sound waves to create images of your heart. It provides your doctor with information about the size and shape of your heart and how well your heart's chambers and valves are working. This procedure takes approximately one hour. There are no restrictions for this procedure.  Follow-Up: At [facility], you and your health needs are our priority.  As part of our continuing mission to provide you with exceptional heart care, we have created designated Provider Care Teams.  These Care Teams include your primary Cardiologist (physician) and Advanced Practice Providers (APPs -  Physician Assistants and Nurse Practitioners) who all work together to provide you with the care you need, when you need it. You will need a follow up appointment in 3 months. You may see Dr. Rayann Heman or one of the following Advanced Practice Providers on your designated Care Team:  Tommye Standard, PA-C  Any Other Special Instructions Will Be Listed Below (If Applicable).

## 2018-12-13 LAB — BASIC METABOLIC PANEL
BUN/Creatinine Ratio: 27 (ref 12–28)
BUN: 33 mg/dL — ABNORMAL HIGH (ref 8–27)
CO2: 22 mmol/L (ref 20–29)
Calcium: 9.9 mg/dL (ref 8.7–10.3)
Chloride: 103 mmol/L (ref 96–106)
Creatinine, Ser: 1.21 mg/dL — ABNORMAL HIGH (ref 0.57–1.00)
GFR calc Af Amer: 48 mL/min/{1.73_m2} — ABNORMAL LOW (ref >59)
GFR calc non Af Amer: 42 mL/min/{1.73_m2} — ABNORMAL LOW (ref >59)
Glucose: 98 mg/dL (ref 65–99)
Potassium: 4.3 mmol/L (ref 3.5–5.2)
Sodium: 140 mmol/L (ref 134–144)

## 2018-12-13 LAB — MAGNESIUM: Magnesium: 2.1 mg/dL (ref 1.6–2.3)

## 2018-12-29 ENCOUNTER — Other Ambulatory Visit (HOSPITAL_COMMUNITY): Payer: PPO

## 2018-12-29 ENCOUNTER — Other Ambulatory Visit: Payer: Self-pay

## 2018-12-29 ENCOUNTER — Ambulatory Visit (HOSPITAL_COMMUNITY): Payer: PPO | Attending: Internal Medicine

## 2018-12-29 DIAGNOSIS — G4733 Obstructive sleep apnea (adult) (pediatric): Secondary | ICD-10-CM | POA: Insufficient documentation

## 2018-12-29 DIAGNOSIS — I083 Combined rheumatic disorders of mitral, aortic and tricuspid valves: Secondary | ICD-10-CM | POA: Insufficient documentation

## 2018-12-29 DIAGNOSIS — E785 Hyperlipidemia, unspecified: Secondary | ICD-10-CM | POA: Insufficient documentation

## 2018-12-29 DIAGNOSIS — I1310 Hypertensive heart and chronic kidney disease without heart failure, with stage 1 through stage 4 chronic kidney disease, or unspecified chronic kidney disease: Secondary | ICD-10-CM | POA: Diagnosis not present

## 2018-12-29 DIAGNOSIS — Z950 Presence of cardiac pacemaker: Secondary | ICD-10-CM | POA: Insufficient documentation

## 2018-12-29 DIAGNOSIS — N189 Chronic kidney disease, unspecified: Secondary | ICD-10-CM | POA: Diagnosis not present

## 2018-12-29 DIAGNOSIS — I493 Ventricular premature depolarization: Secondary | ICD-10-CM | POA: Diagnosis not present

## 2019-01-02 ENCOUNTER — Ambulatory Visit (INDEPENDENT_AMBULATORY_CARE_PROVIDER_SITE_OTHER): Payer: PPO | Admitting: *Deleted

## 2019-01-02 DIAGNOSIS — I441 Atrioventricular block, second degree: Secondary | ICD-10-CM

## 2019-01-03 LAB — CUP PACEART REMOTE DEVICE CHECK
Battery Impedance: 1810 Ohm
Battery Remaining Longevity: 37 mo
Battery Voltage: 2.75 V
Brady Statistic AP VP Percent: 38 %
Brady Statistic AP VS Percent: 0 %
Brady Statistic AS VP Percent: 61 %
Brady Statistic AS VS Percent: 0 %
Date Time Interrogation Session: 20200914210033
Implantable Lead Implant Date: 20100217
Implantable Lead Implant Date: 20100217
Implantable Lead Location: 753859
Implantable Lead Location: 753860
Implantable Lead Model: 5076
Implantable Lead Model: 5076
Implantable Pulse Generator Implant Date: 20100217
Lead Channel Impedance Value: 437 Ohm
Lead Channel Impedance Value: 684 Ohm
Lead Channel Pacing Threshold Amplitude: 0.375 V
Lead Channel Pacing Threshold Amplitude: 0.625 V
Lead Channel Pacing Threshold Pulse Width: 0.4 ms
Lead Channel Pacing Threshold Pulse Width: 0.4 ms
Lead Channel Setting Pacing Amplitude: 2 V
Lead Channel Setting Pacing Amplitude: 2.5 V
Lead Channel Setting Pacing Pulse Width: 0.4 ms
Lead Channel Setting Sensing Sensitivity: 2.8 mV

## 2019-01-05 DIAGNOSIS — D225 Melanocytic nevi of trunk: Secondary | ICD-10-CM | POA: Diagnosis not present

## 2019-01-05 DIAGNOSIS — Z85828 Personal history of other malignant neoplasm of skin: Secondary | ICD-10-CM | POA: Diagnosis not present

## 2019-01-05 DIAGNOSIS — D1801 Hemangioma of skin and subcutaneous tissue: Secondary | ICD-10-CM | POA: Diagnosis not present

## 2019-01-05 DIAGNOSIS — D2271 Melanocytic nevi of right lower limb, including hip: Secondary | ICD-10-CM | POA: Diagnosis not present

## 2019-01-05 DIAGNOSIS — L821 Other seborrheic keratosis: Secondary | ICD-10-CM | POA: Diagnosis not present

## 2019-01-13 ENCOUNTER — Encounter: Payer: Self-pay | Admitting: Cardiology

## 2019-01-13 NOTE — Progress Notes (Signed)
Remote pacemaker transmission.   

## 2019-01-16 ENCOUNTER — Telehealth: Payer: Self-pay | Admitting: Physician Assistant

## 2019-01-16 NOTE — Telephone Encounter (Signed)
Notes recorded by Baldwin [place], PA-C on 01/09/2019 at 7:55 PM EDT  Echo looks OK. Strong heart muscle, no significant valvular abnormalities.    Thanks  Renee   Endorsed to the pt her echo results per Tommye Standard PA-C.  Spent over 15 mins answering multiple questions the pt had over her echo results. Pt verbalized understanding and agrees was gracious for all the assistance provided.

## 2019-01-16 NOTE — Telephone Encounter (Signed)
New Message:  Patient wanted someone to discuss the results from her Echo 09/10

## 2019-01-17 ENCOUNTER — Telehealth: Payer: Self-pay | Admitting: *Deleted

## 2019-01-17 NOTE — Telephone Encounter (Signed)
error 

## 2019-01-26 ENCOUNTER — Ambulatory Visit
Admission: RE | Admit: 2019-01-26 | Discharge: 2019-01-26 | Disposition: A | Discharge status: Home / Self Care | Payer: PPO | Source: Ambulatory Visit | Attending: Internal Medicine | Admitting: Internal Medicine

## 2019-01-26 ENCOUNTER — Other Ambulatory Visit: Payer: Self-pay

## 2019-01-26 DIAGNOSIS — Z1231 Encounter for screening mammogram for malignant neoplasm of breast: Secondary | ICD-10-CM | POA: Diagnosis not present

## 2019-01-26 DIAGNOSIS — E7849 Other hyperlipidemia: Secondary | ICD-10-CM | POA: Diagnosis not present

## 2019-02-02 DIAGNOSIS — M25511 Pain in right shoulder: Secondary | ICD-10-CM | POA: Diagnosis not present

## 2019-02-02 DIAGNOSIS — Z1339 Encounter for screening examination for other mental health and behavioral disorders: Secondary | ICD-10-CM | POA: Diagnosis not present

## 2019-02-02 DIAGNOSIS — K219 Gastro-esophageal reflux disease without esophagitis: Secondary | ICD-10-CM | POA: Diagnosis not present

## 2019-02-02 DIAGNOSIS — Z1331 Encounter for screening for depression: Secondary | ICD-10-CM | POA: Diagnosis not present

## 2019-02-02 DIAGNOSIS — I129 Hypertensive chronic kidney disease with stage 1 through stage 4 chronic kidney disease, or unspecified chronic kidney disease: Secondary | ICD-10-CM | POA: Diagnosis not present

## 2019-02-02 DIAGNOSIS — G4733 Obstructive sleep apnea (adult) (pediatric): Secondary | ICD-10-CM | POA: Diagnosis not present

## 2019-02-02 DIAGNOSIS — Z0000 Encounter for general adult medical examination without abnormal findings: Secondary | ICD-10-CM | POA: Diagnosis not present

## 2019-02-02 DIAGNOSIS — E785 Hyperlipidemia, unspecified: Secondary | ICD-10-CM | POA: Diagnosis not present

## 2019-02-02 DIAGNOSIS — F4321 Adjustment disorder with depressed mood: Secondary | ICD-10-CM | POA: Diagnosis not present

## 2019-02-02 DIAGNOSIS — N3281 Overactive bladder: Secondary | ICD-10-CM | POA: Diagnosis not present

## 2019-02-02 DIAGNOSIS — Z23 Encounter for immunization: Secondary | ICD-10-CM | POA: Diagnosis not present

## 2019-02-02 DIAGNOSIS — Z950 Presence of cardiac pacemaker: Secondary | ICD-10-CM | POA: Diagnosis not present

## 2019-02-02 DIAGNOSIS — N1831 Chronic kidney disease, stage 3a: Secondary | ICD-10-CM | POA: Diagnosis not present

## 2019-02-07 DIAGNOSIS — F419 Anxiety disorder, unspecified: Secondary | ICD-10-CM | POA: Diagnosis not present

## 2019-02-07 DIAGNOSIS — F4321 Adjustment disorder with depressed mood: Secondary | ICD-10-CM | POA: Diagnosis not present

## 2019-02-27 DIAGNOSIS — F4321 Adjustment disorder with depressed mood: Secondary | ICD-10-CM | POA: Diagnosis not present

## 2019-02-27 DIAGNOSIS — F419 Anxiety disorder, unspecified: Secondary | ICD-10-CM | POA: Diagnosis not present

## 2019-03-05 NOTE — Progress Notes (Signed)
Cardiology Office Note Date:  03/06/2019  Patient ID:  [name] 24-Oct-1934, MRN [id number] PCP:  Leanna Battles, MD  Electrophjysiologist:  Dr. Rayann Heman     Chief Complaint:  Planned 3 mo visit  History of Present Illness: [name] is a 84 y.o. female with history of migraine HAs, HTN, HLD, OSA w/CPAP, advanced AVBlock w/PPM.  She comes in today to be seen for Dr. Rayann Heman.  Last seen by him March 2019, at that time, doing well, no changes were made to her meds or programming with plans to see EP APP annually with regular remotes.  I saw her Aug 2010, she was doing fairly well, mentioned that in the last few months her husband who sufferers with Parkinson's disease dementia has become bed bound and she is his sole caregiver.  This is physically and emotionally tolling on her.  She reports one of her son's was in the process of getting relocated and will be moving in to help care for him, she was looking forward to this. She denied nay palpitations.  No dizziness, near syncope or syncope. She reported occasionally getting a chest pressure that resolves every time with drinking some coke and belching.  No CP other wies, no SOB, DOE.   Her device interrogation noted significant increase in PVCs, discussed changing her atenololto toprol, though she was not inclined to change given she had no symptoms of her PVCs, CP was felt to be GI, planned for an echo.  If no changes, planned for 3 mo follow up and re-evaluation of her PVC burden, symptoms.  Questioned if her personal stress was contributing to her PVCs? Echo looked good, LVEF >65%, some degree of impaired relaxation, no significant VHD.  She is doing physically pretty well.  Her son was able to move up and in to the house to help with the care of her husband, though unfortunately he passed away shortly afterwards.  She continiues through her grieving process, is very thankful for her son, but finds herself at a loss still,  unsure of what to do, her days were so consumed with caring for him.  Her PMD started her on Lexapro about a week or so ago.  She denies any cardiac awareness, no CP, palpitations.  A friend has encouraged her to go out on walks with her, she has been walking without unusual DOE or change in her exertional capacity.  No dizzy spells, no near syncope or syncope.  Device information MDT dual chamber PPM, implanted 06/06/08 (Dr. Verlon Setting)   Past Medical History:  Diagnosis Date   Abnormal heart rhythm    Anemia    hx of not recent    Atypical chest pain    Cardiomegaly    Chronic headaches    Chronic kidney disease    Classic migraine with aura 12/17/2014   Depression    Dysrhythmia    HX OF MOBITZ 2 AV BLOCK AND FUNCTIONING PACEMAKER -DR. JAMES ALLRED FOLLOWS PT'S PACEMAKER CARE   GERD (gastroesophageal reflux disease)    Headache(784.0)    PT STATES HER CHRONIC H/A'S HAVE RESOLVED   Hypercholesteremia    Hyperlipidemia    Hypertension    Obstructive sleep apnea on CPAP    Osteoarthritis    Pacemaker    Recurrent upper respiratory infection (URI)    MARCH 2013 DX WITH BRONCHITIS--PT STATES RESOLVED--DOES HAVE SOME ALLERGIES AT PRESENT   Second degree Mobitz II AV block    s/p PPM by Dr  Kersey 2010   Sleep apnea    on CPAP-uses [facility]   Urinary incontinence     Past Surgical History:  Procedure Laterality Date   CARPAL TUNNEL RELEASE Bilateral    CATARACT EXTRACTION Left    CESAREAN SECTION     FOOT SURGERY     HAND SURGERY     KNEE CLOSED REDUCTION  11/30/2011   Procedure: CLOSED MANIPULATION KNEE;  Surgeon: Gearlean Alf, MD;  Location: [facility];  Service: Orthopedics;  Laterality: Left;   KNEE SURGERY     Arthroscopic surg   PACEMAKER INSERTION  2010   dual-chamber (MDT) implanted by Dr Verlon Setting   TOTAL KNEE ARTHROPLASTY Bilateral    right   TOTAL KNEE ARTHROPLASTY  08/24/2011   Procedure: TOTAL KNEE ARTHROPLASTY;  Surgeon: Gearlean Alf,  MD;  Location: [facility];  Service: Orthopedics;  Laterality: Left;   TUBAL LIGATION  1968    Current Outpatient Medications  Medication Sig Dispense Refill   amLODipine (NORVASC) 5 MG tablet Take 5 mg by mouth at bedtime.      atenolol (TENORMIN) 50 MG tablet Take 50 mg by mouth 2 (two) times daily. Takes at breakfast and at bedtime     escitalopram (LEXAPRO) 10 MG tablet Take 10 mg by mouth daily.     Omega-3 Fatty Acids (FISH OIL) 1200 MG CAPS Take 1,200 mg by mouth every evening.     omeprazole (PRILOSEC) 20 MG capsule Take 20 mg by mouth every morning.      OVER THE COUNTER MEDICATION Take 1 tablet by mouth 2 (two) times daily. Macular degeneration supplement (AREDS 2)     oxybutynin (DITROPAN) 5 MG tablet Take 1 tablet by mouth daily.  0   simvastatin (ZOCOR) 40 MG tablet Take 40 mg by mouth at bedtime.      telmisartan-hydrochlorothiazide (MICARDIS HCT) 80-12.5 MG per tablet Take 1 tablet by mouth daily with breakfast.      No current facility-administered medications for this visit.     Allergies:   Amoxicillin and Codeine   Social History:  The patient  reports that she has never smoked. She has never used smokeless tobacco. She reports that she does not drink alcohol or use drugs.   Family History:  The patient's family history includes Allergies in her father; Breast cancer in her mother; Dementia in her mother; Diabetes in her mother; Emphysema in her father; Heart attack in her father; Heart disease in her father and mother; Hypertension in her father and mother; Macular degeneration in her father and sister; Migraines in her daughter; Osteoarthritis in her mother.  ROS:  Please see the history of present illness.  All other systems are reviewed and otherwise negative.   PHYSICAL EXAM:  VS:  BP (!) 112/56    Pulse 85    Ht 5' (1.524 m)    Wt 197 lb (89.4 kg)    SpO2 96%    BMI 38.47 kg/m  BMI: Body mass index is 38.47 kg/m. Well nourished, well developed, in no  acute distress  HEENT: normocephalic, atraumatic  Neck: no JVD, carotid bruits or masses Cardiac:  RRR; no significant murmurs, no rubs, or gallops Lungs: CTA b/l, no wheezing, rhonchi or rales  Abd: soft, nontender MS: no deformity, age appropriate atrophy Ext: no edema  Skin: warm and dry, no rash Neuro:  No gross deficits appreciated Psych: euthymic mood, full affect  PPM site is stable, no tethering or discomfort   EKG:  Not  done today PPM interrogation done today and reviewed by myself:  Battery and lead measurements are good No AMS or HVR episodes PVCs  Since 12/12/2018 has had 56,116   12/29/2018: TTE IMPRESSIONS  1. The left ventricle has hyperdynamic systolic function, with an ejection fraction of >65%. The cavity size was normal. Left ventricular diastolic Doppler parameters are consistent with impaired relaxation.  2. The right ventricle has normal systolic function. The cavity was normal. There is no increase in right ventricular wall thickness.  3. Left atrial size was mildly dilated.  4. The mitral valve is abnormal. Mild thickening of the mitral valve leaflet. There is mild to moderate mitral annular calcification present.  5. Tricuspid valve regurgitation is mild-moderate.  6. The aortic valve is tricuspid. Mild thickening of the aortic valve. Mild calcification of the aortic valve. Aortic valve regurgitation is mild by color flow Doppler.   06/04/08: TTE SUMMARY  - Overall left ventricular systolic function was normal. Left     ventricular ejection fraction was estimated to be 65 %. There     were no left ventricular regional wall motion abnormalities.     Left ventricular wall thickness was at the upper limits of     normal. Left ventricular diastolic function parameters were     normal.  - Aortic valve thickness was mildly increased. There was trivial     aortic valvular regurgitation.  - There was mild mitral valvular  regurgitation.      Recent Labs: 12/12/2018: BUN 33; Creatinine, Ser 1.21; Magnesium 2.1; Potassium 4.3; Sodium 140  No results found for requested labs within last 8760 hours.   CrCl cannot be calculated (Patient's most recent lab result is older than the maximum 21 days allowed.).   Wt Readings from Last 3 Encounters:  03/06/19 197 lb (89.4 kg)  12/12/18 200 lb (90.7 kg)  06/13/18 211 lb 12.8 oz (96.1 kg)     Other studies reviewed: Additional studies/records reviewed today include: summarized above  ASSESSMENT AND PLAN:  1. PPM    Intact function, no programming changes made  2. HTN     Looks ok, no changes  3. HLD     Deferred today  4. PVCs.      She has h/o PVCs by prior in-clinic check a year ago     She does not have palpitations     Frequency/burden appear much higher, particularly the last few months by remote and in-clinic checks  Electrolytes were ok LVEF >65%, no WMA She has had significant life stress with the decline in her husband's health and death in 02/02/2023 She has had some trouble with the transition after his death We discussed again at length changing her atenolol She recently started lexapro  We have decided to have her continue her lexapro, will do a virtual visit with her in about 6 weeks with a remote transmission 3 days prior. If her PVC burden continues to be high, will change her to Toprol, she would be more comfortable at that time to make the change             5. AFib     Single 9 minute episode noted on prior interrogation, none further     Follow via her device, no a/c just yet   Disposition: as above  Current medicines are reviewed at length with the patient today.  The patient did not have any concerns regarding medicines  Signed, Tommye Standard, PA-C 03/06/2019 11:15 AM  [address]  [place] [address] [phone] (office)  [phone] (fax)

## 2019-03-06 ENCOUNTER — Telehealth: Payer: PPO | Admitting: Internal Medicine

## 2019-03-06 ENCOUNTER — Ambulatory Visit: Payer: PPO | Admitting: Physician Assistant

## 2019-03-06 ENCOUNTER — Encounter (INDEPENDENT_AMBULATORY_CARE_PROVIDER_SITE_OTHER): Payer: Self-pay

## 2019-03-06 ENCOUNTER — Other Ambulatory Visit: Payer: Self-pay

## 2019-03-06 VITALS — BP 112/56 | HR 85 | Ht 60.0 in | Wt 197.0 lb

## 2019-03-06 DIAGNOSIS — Z950 Presence of cardiac pacemaker: Secondary | ICD-10-CM | POA: Diagnosis not present

## 2019-03-06 DIAGNOSIS — I10 Essential (primary) hypertension: Secondary | ICD-10-CM | POA: Diagnosis not present

## 2019-03-06 DIAGNOSIS — I480 Paroxysmal atrial fibrillation: Secondary | ICD-10-CM

## 2019-03-06 DIAGNOSIS — I441 Atrioventricular block, second degree: Secondary | ICD-10-CM

## 2019-03-06 DIAGNOSIS — I493 Ventricular premature depolarization: Secondary | ICD-10-CM | POA: Diagnosis not present

## 2019-03-06 NOTE — Patient Instructions (Addendum)
Medication Instructions:  Your physician recommends that you continue on your current medications as directed. Please refer to the Current Medication list given to you today.  *If you need a refill on your cardiac medications before your next appointment, please call your pharmacy*  Lab Work: [name]   If you have labs (blood work) drawn today and your tests are completely normal, you will receive your results only by: Marland Kitchen MyChart Message (if you have MyChart) OR . A paper copy in the mail If you have any lab test that is abnormal or we need to change your treatment, we will call you to review the results.  Testing/Procedures: NONE ORDERED  TODAY   Follow-Up: At [facility], you and your health needs are our priority.  As part of our continuing mission to provide you with exceptional heart care, we have created designated Provider Care Teams.  These Care Teams include your primary Cardiologist (physician) and Advanced Practice Providers (APPs -  Physician Assistants and Nurse Practitioners) who all work together to provide you with the care you need, when you need it.  Your next appointment:   6 WEEKS   The format for your next appointment:   Virtual Visit   Provider:  Tommye Standard, PA-C  Other Instructions  SEND IN REMOTE TRANSMISSION 3 DAYS PRIOR APPOINTMENT TIME DN DAY SCHEDULED FOR FOLLOW UP

## 2019-03-17 DIAGNOSIS — F4321 Adjustment disorder with depressed mood: Secondary | ICD-10-CM | POA: Diagnosis not present

## 2019-03-17 DIAGNOSIS — F419 Anxiety disorder, unspecified: Secondary | ICD-10-CM | POA: Diagnosis not present

## 2019-03-28 DIAGNOSIS — G4730 Sleep apnea, unspecified: Secondary | ICD-10-CM | POA: Diagnosis not present

## 2019-03-28 DIAGNOSIS — G4733 Obstructive sleep apnea (adult) (pediatric): Secondary | ICD-10-CM | POA: Diagnosis not present

## 2019-04-03 ENCOUNTER — Ambulatory Visit (INDEPENDENT_AMBULATORY_CARE_PROVIDER_SITE_OTHER): Payer: PPO | Admitting: *Deleted

## 2019-04-03 DIAGNOSIS — I441 Atrioventricular block, second degree: Secondary | ICD-10-CM | POA: Diagnosis not present

## 2019-04-03 LAB — CUP PACEART REMOTE DEVICE CHECK
Battery Impedance: 1960 Ohm
Battery Remaining Longevity: 31 mo
Battery Voltage: 2.74 V
Brady Statistic AP VP Percent: 70 %
Brady Statistic AP VS Percent: 0 %
Brady Statistic AS VP Percent: 29 %
Brady Statistic AS VS Percent: 0 %
Date Time Interrogation Session: 20201214091404
Implantable Lead Implant Date: 20100217
Implantable Lead Implant Date: 20100217
Implantable Lead Location: 753859
Implantable Lead Location: 753860
Implantable Lead Model: 5076
Implantable Lead Model: 5076
Implantable Pulse Generator Implant Date: 20100217
Lead Channel Impedance Value: 462 Ohm
Lead Channel Impedance Value: 503 Ohm
Lead Channel Pacing Threshold Amplitude: 0.5 V
Lead Channel Pacing Threshold Amplitude: 0.625 V
Lead Channel Pacing Threshold Pulse Width: 0.4 ms
Lead Channel Pacing Threshold Pulse Width: 0.4 ms
Lead Channel Setting Pacing Amplitude: 2 V
Lead Channel Setting Pacing Amplitude: 2.5 V
Lead Channel Setting Pacing Pulse Width: 0.4 ms
Lead Channel Setting Sensing Sensitivity: 2.8 mV

## 2019-04-05 DIAGNOSIS — F4321 Adjustment disorder with depressed mood: Secondary | ICD-10-CM | POA: Diagnosis not present

## 2019-04-05 DIAGNOSIS — F419 Anxiety disorder, unspecified: Secondary | ICD-10-CM | POA: Diagnosis not present

## 2019-04-16 NOTE — Progress Notes (Signed)
Virtual Visit via Telephone Note   This visit type was conducted due to national recommendations for restrictions regarding the COVID-19 Pandemic (e.g. social distancing) in an effort to limit this patient's exposure and mitigate transmission in our community.  Due to her co-morbid illnesses, this patient is at least at moderate risk for complications without adequate follow up.  This format is felt to be most appropriate for this patient at this time.  The patient did not have access to video technology/had technical difficulties with video requiring transitioning to audio format only (telephone).  All issues noted in this document were discussed and addressed.  No physical exam could be performed with this format.  Please refer to the patient's chart for her  consent to telehealth for [facility].   Date:  04/18/2019   ID:  [name], DOB 11-22-1934, MRN [id number]  Patient Location: Home Provider Location: Office  PCP:  Leanna Battles, MD  Cardiologist:  No primary care provider on file. Electrophysiologist:  Dr. Rayann Heman  Evaluation Performed:  Follow-Up Visit  Chief Complaint:   follow up PVC burden  History of Present Illness:    Due to national recommendations of social distancing due to [place] 19, an audio/video telehealth visit is felt to be most appropriate for this patient at this time.   See MyChart message from today for the patient's consent to telehealth for [facility].  [name] is a 84 y.o. female with history of migraine HAs, HTN, HLD, OSA w/CPAP, advanced AVBlock w/PPM.  I have seen her a couple times in the last few months, noting an increase in PVCs, this unfortunately coincides with her husband's failing health, she his primary caregiver, and ultimately recent death.  She had an echo with preserved LVEF, and she had no palpitations.  Discussed changing he atenolol to toprol though she wanted to hold off to see if the lexapro her PMD started for her  anxiety/depression and some more time to go by after her husband's death did not show a reduction in the ectopy.  Since 12/12/2018 to 03/06/2019 had 56,116 (approx 18,705/mo)  We planned to a 6 week tele-health visit and remote to touch base on how she was doing.   The patient is identified by 2 patient identifiers. She reports doing better every day as time passes and thinks the lexapro is helping as well, but hopes she can get off that in the future with her PMD guidance. She mentions that she is eating hours later in the evening then she used to and noted indigestion after dinner.  This is resolved quickly by drinking some coke and belching.  No other symptoms of any kind of CP otherwise.  She walks her dog 2-3 times a day to the end of her cul-de-sac and back without changes to her exertional capacity, no DOE.  She will get winded when she rushes for some reason, but does well at her usual pace and feels well. She has no cardiac awareness, no palpitations.  She will infrequently feel fleetingly dizzy when standing from stooping down or bending over, no dizziness otherwise, no near syncope or syncope.    Device information MDT dual chamber PPM, implanted 06/06/08 (Dr. Verlon Setting)    Past Medical History:  Diagnosis Date  . Abnormal heart rhythm   . Anemia    hx of not recent   . Atypical chest pain   . Cardiomegaly   . Chronic headaches   . Chronic kidney disease   .  Classic migraine with aura 12/17/2014  . Depression   . Dysrhythmia    HX OF MOBITZ 2 AV BLOCK AND FUNCTIONING PACEMAKER -DR. JAMES ALLRED FOLLOWS PT'S PACEMAKER CARE  . GERD (gastroesophageal reflux disease)   . Headache(784.0)    PT STATES HER CHRONIC H/A'S HAVE RESOLVED  . Hypercholesteremia   . Hyperlipidemia   . Hypertension   . Obstructive sleep apnea on CPAP   . Osteoarthritis   . Pacemaker   . Recurrent upper respiratory infection (URI)    MARCH 2013 DX WITH BRONCHITIS--PT STATES RESOLVED--DOES HAVE SOME  ALLERGIES AT PRESENT  . Second degree Mobitz II AV block    s/p PPM by Dr Verlon Setting 2010  . Sleep apnea    on CPAP-uses [facility]  . Urinary incontinence    Past Surgical History:  Procedure Laterality Date  . CARPAL TUNNEL RELEASE Bilateral   . CATARACT EXTRACTION Left   . CESAREAN SECTION    . FOOT SURGERY    . HAND SURGERY    . KNEE CLOSED REDUCTION  11/30/2011   Procedure: CLOSED MANIPULATION KNEE;  Surgeon: Gearlean Alf, MD;  Location: [facility];  Service: Orthopedics;  Laterality: Left;  . KNEE SURGERY     Arthroscopic surg  . PACEMAKER INSERTION  2010   dual-chamber (MDT) implanted by Dr Verlon Setting  . TOTAL KNEE ARTHROPLASTY Bilateral    right  . TOTAL KNEE ARTHROPLASTY  08/24/2011   Procedure: TOTAL KNEE ARTHROPLASTY;  Surgeon: Gearlean Alf, MD;  Location: [facility];  Service: Orthopedics;  Laterality: Left;  . TUBAL LIGATION  1968     Current Meds  Medication Sig  . amLODipine (NORVASC) 5 MG tablet Take 5 mg by mouth at bedtime.   Marland Kitchen atenolol (TENORMIN) 50 MG tablet Take 50 mg by mouth 2 (two) times daily. Takes at breakfast and at bedtime  . escitalopram (LEXAPRO) 10 MG tablet Take 10 mg by mouth daily.  . Omega-3 Fatty Acids (FISH OIL) 1200 MG CAPS Take 1,200 mg by mouth every evening.  Marland Kitchen omeprazole (PRILOSEC) 20 MG capsule Take 20 mg by mouth every morning.   Marland Kitchen OVER THE COUNTER MEDICATION Take 1 tablet by mouth 2 (two) times daily. Macular degeneration supplement (AREDS 2)  . oxybutynin (DITROPAN) 5 MG tablet Take 1 tablet by mouth 2 (two) times daily.   . simvastatin (ZOCOR) 40 MG tablet Take 40 mg by mouth at bedtime.   Marland Kitchen telmisartan-hydrochlorothiazide (MICARDIS HCT) 80-12.5 MG per tablet Take 1 tablet by mouth daily with breakfast.      Allergies:   Amoxicillin and Codeine   Social History   Tobacco Use  . Smoking status: Never Smoker  . Smokeless tobacco: Never Used  Substance Use Topics  . Alcohol use: No  . Drug use: No     Family Hx: The patient's family  history includes Allergies in her father; Breast cancer in her mother; Dementia in her mother; Diabetes in her mother; Emphysema in her father; Heart attack in her father; Heart disease in her father and mother; Hypertension in her father and mother; Macular degeneration in her father and sister; Migraines in her daughter; Osteoarthritis in her mother.  ROS:   Please see the history of present illness.     All other systems reviewed and are negative.   Prior CV studies:   The following studies were reviewed today:  PPM remote transmission from last night is reviewed Battery and lead measurements are good 99.5%VP From 11/16-12/28/2020 PVCs 32,045  Burden approx 0.7%  12/29/2018: TTE IMPRESSIONS 1. The left ventricle has hyperdynamic systolic function, with an ejection fraction of >65%. The cavity size was normal. Left ventricular diastolic Doppler parameters are consistent with impaired relaxation. 2. The right ventricle has normal systolic function. The cavity was normal. There is no increase in right ventricular wall thickness. 3. Left atrial size was mildly dilated. 4. The mitral valve is abnormal. Mild thickening of the mitral valve leaflet. There is mild to moderate mitral annular calcification present. 5. Tricuspid valve regurgitation is mild-moderate. 6. The aortic valve is tricuspid. Mild thickening of the aortic valve. Mild calcification of the aortic valve. Aortic valve regurgitation is mild by color flow Doppler.   06/04/08: TTE SUMMARY  - Overall left ventricular systolic function was normal. Left     ventricular ejection fraction was estimated to be 65 %. There     were no left ventricular regional wall motion abnormalities.     Left ventricular wall thickness was at the upper limits of     normal. Left ventricular diastolic function parameters were     normal.  - Aortic valve thickness was mildly increased. There was trivial      aortic valvular regurgitation.  - There was mild mitral valvular regurgitation.   Labs/Other Tests and Data Reviewed:    EKG:  No new EKGs to review  Recent Labs: 12/12/2018: BUN 33; Creatinine, Ser 1.21; Magnesium 2.1; Potassium 4.3; Sodium 140   Recent Lipid Panel Lab Results  Component Value Date/Time   CHOL  06/04/2008 02:30 PM    171        ATP III CLASSIFICATION:  <200     mg/dL   Desirable  200-239  mg/dL   Borderline High  >=240    mg/dL   High          TRIG 144 06/04/2008 02:30 PM   HDL 47 06/04/2008 02:30 PM   CHOLHDL 3.6 06/04/2008 02:30 PM   LDLCALC  06/04/2008 02:30 PM    95        Total Cholesterol/HDL:CHD Risk Coronary Heart Disease Risk Table                     Men   Women  1/2 Average Risk   3.4   3.3  Average Risk       5.0   4.4  2 X Average Risk   9.6   7.1  3 X Average Risk  23.4   11.0        Use the calculated Patient Ratio above and the CHD Risk Table to determine the patient's CHD Risk.        ATP III CLASSIFICATION (LDL):  <100     mg/dL   Optimal  100-129  mg/dL   Near or Above                    Optimal  130-159  mg/dL   Borderline  160-189  mg/dL   High  >190     mg/dL   Very High    Wt Readings from Last 3 Encounters:  04/18/19 200 lb (90.7 kg)  03/06/19 197 lb (89.4 kg)  12/12/18 200 lb (90.7 kg)     Objective:    Vital Signs:  Ht 5' (1.524 m)   Wt 200 lb (90.7 kg)   BMI 39.06 kg/m    She sounds good on the phone, her voice is strong, she speaks comfortably in  full sentences at a normal pace. She does not sound winded or in any disctress  ASSESSMENT & PLAN:    1. PPM     stable remote, battery estimate 50mo  2. HTN     has been OK last in-clinic visits, she was unable to do a BP today on her own  3. HLD     Deferred today  4. PVCs.      She has h/o some PVCs by prior in-clinic check a year ago     She does not have palpitations     Frequency/burden appear to have increased, particularly the last few months  by remote and in-clinic      They continue  Overall her burden is low but number is rising, she is asymptomatic She does not have any anginal symptoms She would like to hold off on any stress testing, ischemic evaluation Will change her atenolol to propanolol   In d/w RPH today 100mg  atenolol  Equivalent of propanolol would be 160mg , though will start 120mg  daily and titrate pending her BP, response to medicine change  She will have her son help her keep an eye on her BP at home a couple days a week will be fine, she will let us know if any concerns once medicine change is made           5. AFib     Single 9 minute episode noted on a prior interrogation, none further todate     Follow via her device, no a/c just yet    Time:   Today, I have spent 20 minutes with the patient with telehealth technology discussing the above problems.     Medication Adjustments/Labs and Tests Ordered: Current medicines are reviewed at length with the patient today.  Concerns regarding medicines are outlined above.   Tests Ordered: No orders of the defined types were placed in this encounter.   Medication Changes: No orders of the defined types were placed in this encounter.   Follow Up:  Virtual Visit  in 3 months, she will send a remote transmission the evening prior  Signed, Baldwin Jamaica, PA-C  04/18/2019 11:00 AM    [place]

## 2019-04-17 ENCOUNTER — Telehealth: Payer: Self-pay

## 2019-04-17 NOTE — Telephone Encounter (Signed)
I spoke with the pt and asked her to send in a transmission with her home monitor. She said she will make sure she do one today.

## 2019-04-18 ENCOUNTER — Telehealth (INDEPENDENT_AMBULATORY_CARE_PROVIDER_SITE_OTHER): Payer: PPO | Admitting: Physician Assistant

## 2019-04-18 ENCOUNTER — Telehealth: Payer: Self-pay | Admitting: *Deleted

## 2019-04-18 ENCOUNTER — Other Ambulatory Visit: Payer: Self-pay

## 2019-04-18 VITALS — Ht 60.0 in | Wt 200.0 lb

## 2019-04-18 DIAGNOSIS — Z950 Presence of cardiac pacemaker: Secondary | ICD-10-CM

## 2019-04-18 DIAGNOSIS — I493 Ventricular premature depolarization: Secondary | ICD-10-CM

## 2019-04-18 DIAGNOSIS — I10 Essential (primary) hypertension: Secondary | ICD-10-CM

## 2019-04-18 MED ORDER — PROPRANOLOL HCL ER 120 MG PO CP24: 120.0000 mg | ORAL_CAPSULE | Freq: Every day | ORAL | 1 refills | Status: DC

## 2019-04-18 NOTE — Telephone Encounter (Signed)
.   Virtual Visit Pre-Appointment Phone Call   I am calling you today to discuss your upcoming appointment. We are currently trying to limit exposure to the virus that causes COVID-19 by seeing patients at home rather than in the office."  1. "What is the BEST phone number to call the day of the visit?" - include this in appointment notes  2. "Do you have or have access to (through a family member/friend) a smartphone with video capability that we can use for your visit?" a. If yes - list this number in appt notes as "cell" (if different from BEST phone #) and list the appointment type as a VIDEO visit in appointment notes b. If no - list the appointment type as a PHONE visit in appointment notes  3. Confirm consent - "In the setting of the current Covid19 crisis, you are scheduled for a (phone or video) visit with your provider on (date) at (time).  Just as we do with many in-office visits, in order for you to participate in this visit, we must obtain consent.  If you'd like, I can send this to your mychart (if signed up) or email for you to review.  Otherwise, I can obtain your verbal consent now.  All virtual visits are billed to your insurance company just like a normal visit would be.  By agreeing to a virtual visit, we'd like you to understand that the technology does not allow for your provider to perform an examination, and thus may limit your provider's ability to fully assess your condition. If your provider identifies any concerns that need to be evaluated in person, we will make arrangements to do so.  Finally, though the technology is pretty good, we cannot assure that it will always work on either your or our end, and in the setting of a video visit, we may have to convert it to a phone-only visit.  In either situation, we cannot ensure that we have a secure connection.  Are you willing to proceed?" STAFF: Did the patient verbally acknowledge consent to telehealth visit? Document YES/NO  here: YES.  4. Advise patient to be prepared - "Two hours prior to your appointment, go ahead and check your blood pressure, pulse, oxygen saturation, and your weight (if you have the equipment to check those) and write them all down. When your visit starts, your provider will ask you for this information. If you have an Apple Watch or Kardia device, please plan to have heart rate information ready on the day of your appointment. Please have a pen and paper handy nearby the day of the visit as well."  5. Give patient instructions for MyChart download to smartphone OR Doximity/Doxy.me as below if video visit (depending on what platform provider is using)  6. Inform patient they will receive a phone call 15 minutes prior to their appointment time (may be from unknown caller ID) so they should be prepared to answer    TELEPHONE CALL NOTE  Yula Rostron Spira has been deemed a candidate for a follow-up tele-health visit to limit community exposure during the Covid-19 pandemic. I spoke with the patient via phone to ensure availability of phone/video source, confirm preferred email & phone number, and discuss instructions and expectations.  I reminded [name] to be prepared with any vital sign and/or heart rhythm information that could potentially be obtained via home monitoring, at the time of her visit. I reminded Aaylah Walega Dickison to expect a phone call prior  to her visit.  Claude Manges, [place] 04/18/2019 10:28 AM   INSTRUCTIONS FOR DOWNLOADING THE MYCHART APP TO SMARTPHONE  - The patient must first make sure to have activated MyChart and know their login information - If Apple, go to [employer] and type in MyChart in the search bar and download the app. If Android, ask patient to go to [employer] and type in [place] in the search bar and download the app. The app is free but as with any other app downloads, their phone may require them to verify saved payment information or Apple/Android  password.  - The patient will need to then log into the app with their MyChart username and password, and select [facility] as their healthcare provider to link the account. When it is time for your visit, go to the MyChart app, find appointments, and click Begin Video Visit. Be sure to Select Allow for your device to access the Microphone and Camera for your visit. You will then be connected, and your provider will be with you shortly.  **If they have any issues connecting, or need assistance please contact MyChart service desk [phone])**  **If using a computer, in order to ensure the best quality for their visit they will need to use either of the following Internet Browsers: [employer], or Google Chrome**  IF USING DOXIMITY or DOXY.ME - The patient will receive a link just prior to their visit by text.     FULL LENGTH CONSENT FOR TELE-HEALTH VISIT   I hereby voluntarily request, consent and authorize [facility] and its employed or contracted physicians, physician assistants, nurse practitioners or other licensed health care professionals (the Practitioner), to provide me with telemedicine health care services (the "Services") as deemed necessary by the treating Practitioner. I acknowledge and consent to receive the Services by the Practitioner via telemedicine. I understand that the telemedicine visit will involve communicating with the Practitioner through live audiovisual communication technology and the disclosure of certain medical information by electronic transmission. I acknowledge that I have been given the opportunity to request an in-person assessment or other available alternative prior to the telemedicine visit and am voluntarily participating in the telemedicine visit.  I understand that I have the right to withhold or withdraw my consent to the use of telemedicine in the course of my care at any time, without affecting my right to future care or treatment,  and that the Practitioner or I may terminate the telemedicine visit at any time. I understand that I have the right to inspect all information obtained and/or recorded in the course of the telemedicine visit and may receive copies of available information for a reasonable fee.  I understand that some of the potential risks of receiving the Services via telemedicine include:  Marland Kitchen Delay or interruption in medical evaluation due to technological equipment failure or disruption; . Information transmitted may not be sufficient (e.g. poor resolution of images) to allow for appropriate medical decision making by the Practitioner; and/or  . In rare instances, security protocols could fail, causing a breach of personal health information.  Furthermore, I acknowledge that it is my responsibility to provide information about my medical history, conditions and care that is complete and accurate to the best of my ability. I acknowledge that Practitioner's advice, recommendations, and/or decision may be based on factors not within their control, such as incomplete or inaccurate data provided by me or distortions of diagnostic images or specimens that may result from electronic transmissions.  I understand that the practice of medicine is not an exact science and that Practitioner makes no warranties or guarantees regarding treatment outcomes. I acknowledge that I will receive a copy of this consent concurrently upon execution via email to the email address I last provided but may also request a printed copy by calling the office of [facility].    I understand that my insurance will be billed for this visit.   I have read or had this consent read to me. . I understand the contents of this consent, which adequately explains the benefits and risks of the Services being provided via telemedicine.  . I have been provided ample opportunity to ask questions regarding this consent and the Services and have had my questions  answered to my satisfaction. . I give my informed consent for the services to be provided through the use of telemedicine in my medical care  By participating in this telemedicine visit I agree to the above.

## 2019-04-18 NOTE — Patient Instructions (Signed)
Medication Instructions:   START TAKING :  ATENOLOL  50 MG TWICE A DAY   STOP TAKING :  PROPRANOLOL 120  MG ONCE A DAY   *If you need a refill on your cardiac medications before your next appointment, please call your pharmacy*  Lab Work:  NONE ORDERED  TODAY   If you have labs (blood work) drawn today and your tests are completely normal, you will receive your results only by: Marland Kitchen MyChart Message (if you have MyChart) OR . A paper copy in the mail If you have any lab test that is abnormal or we need to change your treatment, we will call you to review the results.  Testing/Procedures: NONE ORDERED  TODAY   Follow-Up:  At [facility], you and your health needs are our priority.  As part of our continuing mission to provide you with exceptional heart care, we have created designated Provider Care Teams.  These Care Teams include your primary Cardiologist (physician) and Advanced Practice Providers (APPs -  Physician Assistants and Nurse Practitioners) who all work together to provide you with the care you need, when you need it.  Your next appointment:   3 month(s)  The format for your next appointment:   Virtual Visit   Provider:  You may see Tommye Standard, PA-C    Other Instructions

## 2019-04-18 NOTE — Telephone Encounter (Signed)
Error

## 2019-04-20 NOTE — Telephone Encounter (Signed)
Transmission was received.  

## 2019-05-04 DIAGNOSIS — F419 Anxiety disorder, unspecified: Secondary | ICD-10-CM | POA: Diagnosis not present

## 2019-05-04 DIAGNOSIS — F4321 Adjustment disorder with depressed mood: Secondary | ICD-10-CM | POA: Diagnosis not present

## 2019-05-07 NOTE — Progress Notes (Signed)
PPM remote 

## 2019-05-09 ENCOUNTER — Other Ambulatory Visit: Payer: Self-pay | Admitting: Internal Medicine

## 2019-05-09 NOTE — Telephone Encounter (Signed)
New Message      Pt c/o BP issue: STAT if pt c/o blurred vision, one-sided weakness or slurred speech  1. What are your last 5 BP readings?  01/19 10am 166/80 01/18 10am 115/61 01/18 10pm 140/67   01/16 141/78 01/15 150/96  01/11 157/77   2. Are you having any other symptoms (ex. Dizziness, headache, blurred vision, passed out)? No   3. What is your BP issue? Pt says she started taking a new medication on DEC 31st propranolol ER (INDERAL LA) 120 MG 24 hr capsule and doesn't think It is helping with her BP    Please call

## 2019-05-10 MED ORDER — PROPRANOLOL HCL ER 160 MG PO CP24: 160.0000 mg | ORAL_CAPSULE | Freq: Every day | ORAL | 3 refills | Status: DC

## 2019-05-10 NOTE — Telephone Encounter (Signed)
Spoke with patient and patient aware of increase of propranolol from 120 mg to 160 mg once a day. Patient asked for a 30 day supply. Patient stated she will pick up today and start in the am. She will monitor her blood pressure over next couple of weeks and Give [place] her log of readings.[name]

## 2019-06-12 DIAGNOSIS — F419 Anxiety disorder, unspecified: Secondary | ICD-10-CM | POA: Diagnosis not present

## 2019-06-12 DIAGNOSIS — F4321 Adjustment disorder with depressed mood: Secondary | ICD-10-CM | POA: Diagnosis not present

## 2019-06-15 ENCOUNTER — Encounter: Payer: Self-pay | Admitting: Internal Medicine

## 2019-06-15 ENCOUNTER — Other Ambulatory Visit: Payer: Self-pay

## 2019-06-15 ENCOUNTER — Ambulatory Visit: Payer: PPO | Admitting: Internal Medicine

## 2019-06-15 DIAGNOSIS — I480 Paroxysmal atrial fibrillation: Secondary | ICD-10-CM

## 2019-06-15 DIAGNOSIS — G4733 Obstructive sleep apnea (adult) (pediatric): Secondary | ICD-10-CM | POA: Diagnosis not present

## 2019-06-15 DIAGNOSIS — R05 Cough: Secondary | ICD-10-CM | POA: Diagnosis not present

## 2019-06-15 DIAGNOSIS — R053 Chronic cough: Secondary | ICD-10-CM

## 2019-06-15 MED ORDER — AZITHROMYCIN 250 MG PO TABS: ORAL_TABLET | ORAL | 0 refills | Status: DC

## 2019-06-15 NOTE — Progress Notes (Signed)
Patient ID: [name], female    DOB: 1934/07/01, 85 y.o.   MRN: [id number]  HPI Female never smoker followed for cough/ GERD, OSA/CPAP, complicated by HBP, [id number] Pacemaker PFT [facility] 03/18/10 showed mild reversible small  airway obstruction  --------------------------------------------------------------------------------------------------------.  06/13/2018- 85 year old female never smoker followed for Cough/ GERD, OSA/CPAP, complicated by HBP, [id number] pacemaker CPAP auto 7-15/[employer]  Download 100% compliance AHI 0.5/hour she prefers not to bother with humidifier. -----She is having nasal congestion each moring after using cpap Body weight today 211 pounds For at least several months she has been aware, on waking in the morning of a "wide" of mucus in her throat.  This washes down easily with water after which she is comfortable.  She has had some recurrence of chronic cough noted occasionally and usually nonproductive.  Occasionally she can clear the throat mucus in the morning to notices some brown color.  She got a So Clean machine but it did not affect this. She is taking oxybutynin for bladder control, which causes dry mouth.  Not using her Flonase. Denies wheeze, chest rattle, night sweats/fever.  06/15/19- 85 year old female never smoker followed for OSA/CPAP, Cough/ GERD,  complicated by HBP, [id number] pacemaker CPAP auto 7-15/ [facility] Download compliance 93%, AHI 0.6/ hr Body weight today 205 lbs Some cough. Wakes in AM with brown mucus/ bad taste in mouth. Usually no cough or sinus discomfort through the day. Biotene mouth gel helps. Husband dies Parkinson's 2 months ago.  CXR 06/13/2018- IMPRESSION: No active cardiopulmonary disease.  Review of Systems-See HPI + = positive Constitutional:   No-   weight loss, night sweats, fevers, chills, fatigue, lassitude. HEENT:   No-  headaches, difficulty swallowing, tooth/dental problems, sore throat,       No-   sneezing, itching, ear ache, nasal congestion, post nasal drip,  CV:  No-   chest pain, orthopnea, PND, swelling in lower extremities, anasarca, as dizziness, palpitations Resp: No-   shortness of breath with exertion or at rest.              + productive cough,  + non-productive cough,  No- coughing up of blood.             +  change in color of mucus.  No- wheezing.   Skin: No-   rash or lesions. GI:  No-   heartburn, indigestion, abdominal pain, nausea, vomiting,  GU:  MS:  No-   joint pain or swelling.   Neuro-     nothing unusual Psych:  No- change in mood or affect. No depression or anxiety.  No memory loss.    Objective:   Physical Exam General- Alert, Oriented, Affect-appropriate, Distress- none acute, + obese Skin- rash-none, lesions- none, excoriation- none Lymphadenopathy- none Head- atraumatic            Eyes- Gross vision intact, PERRLA, conjunctivae clear secretions            Ears- Hearing, canals-normal            Nose- Clear, no-Septal dev, mucosa clear polyps, erosion, perforation             Throat- Mallampati III-IV , mucosa clear , drainage- none, tonsils- atrophic Neck- flexible , trachea midline, no stridor , thyroid nl, carotid no bruit Chest - symmetrical excursion , unlabored           Heart/CV- RRR , no murmur , no gallop  , no rub, nl s1 s2                           -  JVD- none , edema- none, stasis changes- none, varices- none           Lung- + mild tachypnea during speech, wheeze-none cough- none , dullness-none, rub- none           Chest wall-+ L pacemaker Abd-  Br/ Gen/ Rectal- Not done, not indicated Extrem- cyanosis- none, clubbing, none, atrophy- none, strength- nl Neuro- grossly intact to observation

## 2019-06-15 NOTE — Patient Instructions (Addendum)
We can continue CPAP auto 7-15, mask of choice, humidifier, supplies, AirView/ card  Script sent for Zpak antibiotic. See if it changes the brown mucus you notice in the morning.  Please call if we can help

## 2019-06-17 NOTE — Assessment & Plan Note (Signed)
Benefits  From CPAP with good compliance and control

## 2019-06-17 NOTE — Assessment & Plan Note (Signed)
Describes brown mucus in mouth in the morning, but minimizes symptoms otherwise. Discussed possible low grade sinus infection or bronchitis.  Plan- therapeutic trial of Zpak

## 2019-06-20 DIAGNOSIS — H903 Sensorineural hearing loss, bilateral: Secondary | ICD-10-CM | POA: Diagnosis not present

## 2019-06-26 DIAGNOSIS — H35033 Hypertensive retinopathy, bilateral: Secondary | ICD-10-CM | POA: Diagnosis not present

## 2019-06-26 DIAGNOSIS — H35373 Puckering of macula, bilateral: Secondary | ICD-10-CM | POA: Diagnosis not present

## 2019-06-26 DIAGNOSIS — H35372 Puckering of macula, left eye: Secondary | ICD-10-CM | POA: Diagnosis not present

## 2019-06-26 DIAGNOSIS — H353132 Nonexudative age-related macular degeneration, bilateral, intermediate dry stage: Secondary | ICD-10-CM | POA: Diagnosis not present

## 2019-07-03 ENCOUNTER — Ambulatory Visit (INDEPENDENT_AMBULATORY_CARE_PROVIDER_SITE_OTHER): Payer: PPO | Admitting: *Deleted

## 2019-07-03 DIAGNOSIS — I441 Atrioventricular block, second degree: Secondary | ICD-10-CM | POA: Diagnosis not present

## 2019-07-05 ENCOUNTER — Telehealth: Payer: Self-pay

## 2019-07-05 LAB — CUP PACEART REMOTE DEVICE CHECK
Battery Impedance: 2042 Ohm
Battery Remaining Longevity: 31 mo
Battery Voltage: 2.75 V
Brady Statistic AP VP Percent: 50 %
Brady Statistic AP VS Percent: 0 %
Brady Statistic AS VP Percent: 49 %
Brady Statistic AS VS Percent: 1 %
Date Time Interrogation Session: 20210317101750
Implantable Lead Implant Date: 20100217
Implantable Lead Implant Date: 20100217
Implantable Lead Location: 753859
Implantable Lead Location: 753860
Implantable Lead Model: 5076
Implantable Lead Model: 5076
Implantable Pulse Generator Implant Date: 20100217
Lead Channel Impedance Value: 411 Ohm
Lead Channel Impedance Value: 592 Ohm
Lead Channel Pacing Threshold Amplitude: 0.375 V
Lead Channel Pacing Threshold Amplitude: 0.625 V
Lead Channel Pacing Threshold Pulse Width: 0.4 ms
Lead Channel Pacing Threshold Pulse Width: 0.4 ms
Lead Channel Setting Pacing Amplitude: 2 V
Lead Channel Setting Pacing Amplitude: 2.5 V
Lead Channel Setting Pacing Pulse Width: 0.4 ms
Lead Channel Setting Sensing Sensitivity: 2.8 mV

## 2019-07-05 NOTE — Progress Notes (Signed)
PPM Remote  

## 2019-07-05 NOTE — Telephone Encounter (Signed)
Spoke with patient to remind of missed remote transmission 

## 2019-07-13 NOTE — Progress Notes (Signed)
Virtual Visit via Telephone Note   This visit type was conducted due to national recommendations for restrictions regarding the COVID-19 Pandemic (e.g. social distancing) in an effort to limit this patient's exposure and mitigate transmission in our community.  Due to her co-morbid illnesses, this patient is at least at moderate risk for complications without adequate follow up.  This format is felt to be most appropriate for this patient at this time.  The patient did not have access to video technology/had technical difficulties with video requiring transitioning to audio format only (telephone).  All issues noted in this document were discussed and addressed.  No physical exam could be performed with this format.  Please refer to the patient's chart for her  consent to telehealth for [facility].   The patient was identified using 2 identifiers.  Date:  07/13/2019   ID:  [name], DOB [birth year], MRN [id number]  Patient Location: Home Provider Location: Office  PCP:  Leanna Battles, MD  Electrophysiologist:  Dr. Rayann Heman  Evaluation Performed:  Follow-Up Visit  Chief Complaint:  Planned follow up  History of Present Illness:    [name] is a 85 y.o. female with history of migraine HAs, HTN, HLD, OSA w/CPAP, advanced AVBlock w/PPM.  I have seen her a couple times in the last several months, noting an increase in PVCs, this unfortunately coincides with her husband's failing health, she his primary caregiver, and ultimately recent death.  She had an echo with preserved LVEF, and she had no palpitations.  Discussed changing he atenolol to toprol though she wanted to hold off to see if the lexapro her PMD started for her anxiety/depression and some more time to go by after her husband's death did not show a reduction in the ectopy.  Since 12/12/2018 to 03/06/2019 had 56,116 (approx 18,705/mo)  We planned to a 6 week tele-health visit and remote to touch base on how she  was doing.  Apr 18, 2019 The patient was identified by 2 patient identifiers. She reported doing better every day as time passes and thinks the lexapro is helping as well, but hopes she can get off that in the future with her PMD guidance. She mentioned that she is eating hours later in the evening then she used to and noted indigestion after dinner.  This is resolved quickly by drinking some coke and belching.  No other symptoms of any kind of CP otherwise.  She was walking her dog 2-3 times a day to the end of her cul-de-sac and back without changes to her exertional capacity, no DOE.  She will get winded when she rushes for some reason, but does well at her usual pace and feels well. She had no cardiac awareness, no palpitations.  She reported infrequently feeling fleetingly dizzy when standing from stooping down or bending over, no dizziness otherwise, no near syncope or syncope.  PVC numbers increasing, overall burden low, 99% VP, no symptoms.  No Anginal symptoms, she did not want to pursue ischemic work up, changed her atenolol to propanolol.  No new AFib, no a/c Planned for virtual visit in 3 months with a remote transmission.   TODAY She is doing well.  Feels like her life is starting to settle down, has taken care of her taxes, and some estate things after the death of her husband.  She continues to walk her dog, and walk to visit neighbors, no changes to her exertional capacity.  No difficulties with her ADLs. No  dizzy spells, near syncope or syncope. No CP, no palpitations. Tolerating the change in her BB, no side effects that she has noted  Device information MDT dual chamber PPM, implanted 06/06/08 (Dr. Verlon Setting)    Past Medical History:  Diagnosis Date  . Abnormal heart rhythm   . Anemia    hx of not recent   . Atypical chest pain   . Cardiomegaly   . Chronic headaches   . Chronic kidney disease   . Classic migraine with aura 12/17/2014  . Depression   . Dysrhythmia      HX OF MOBITZ 2 AV BLOCK AND FUNCTIONING PACEMAKER -DR. JAMES ALLRED FOLLOWS PT'S PACEMAKER CARE  . GERD (gastroesophageal reflux disease)   . Headache(784.0)    PT STATES HER CHRONIC H/A'S HAVE RESOLVED  . Hypercholesteremia   . Hyperlipidemia   . Hypertension   . Obstructive sleep apnea on CPAP   . Osteoarthritis   . Pacemaker   . Recurrent upper respiratory infection (URI)    MARCH 2013 DX WITH BRONCHITIS--PT STATES RESOLVED--DOES HAVE SOME ALLERGIES AT PRESENT  . Second degree Mobitz II AV block    s/p PPM by Dr Verlon Setting 2010  . Sleep apnea    on CPAP-uses [employer]  . Urinary incontinence    Past Surgical History:  Procedure Laterality Date  . CARPAL TUNNEL RELEASE Bilateral   . CATARACT EXTRACTION Left   . CESAREAN SECTION    . FOOT SURGERY    . HAND SURGERY    . KNEE CLOSED REDUCTION  11/30/2011   Procedure: CLOSED MANIPULATION KNEE;  Surgeon: Gearlean Alf, MD;  Location: [facility];  Service: Orthopedics;  Laterality: Left;  . KNEE SURGERY     Arthroscopic surg  . PACEMAKER INSERTION  2010   dual-chamber (MDT) implanted by Dr Verlon Setting  . TOTAL KNEE ARTHROPLASTY Bilateral    right  . TOTAL KNEE ARTHROPLASTY  08/24/2011   Procedure: TOTAL KNEE ARTHROPLASTY;  Surgeon: Gearlean Alf, MD;  Location: [facility];  Service: Orthopedics;  Laterality: Left;  . TUBAL LIGATION  1968     No outpatient medications have been marked as taking for the 07/14/19 encounter (Appointment) with Baldwin Jamaica, PA-C.     Allergies:   Amoxicillin and Codeine   Social History   Tobacco Use  . Smoking status: Never Smoker  . Smokeless tobacco: Never Used  Substance Use Topics  . Alcohol use: No  . Drug use: No     Family Hx: The patient's family history includes Allergies in her father; Breast cancer in her mother; Dementia in her mother; Diabetes in her mother; Emphysema in her father; Heart attack in her father; Heart disease in her father and mother; Hypertension in her father and mother;  Macular degeneration in her father and sister; Migraines in her daughter; Osteoarthritis in her mother.  ROS:   Please see the history of present illness.    All other systems reviewed and are negative.   Prior CV studies:   The following studies were reviewed today:  07/05/19: PPM transmission Battery lead measurements are good VP 99.2% approx  30,000 PVCs/mo No AMS, no HVR  12/29/2018: TTE IMPRESSIONS 1. The left ventricle has hyperdynamic systolic function, with an ejection fraction of >65%. The cavity size was normal. Left ventricular diastolic Doppler parameters are consistent with impaired relaxation. 2. The right ventricle has normal systolic function. The cavity was normal. There is no increase in right ventricular wall thickness. 3. Left atrial size was  mildly dilated. 4. The mitral valve is abnormal. Mild thickening of the mitral valve leaflet. There is mild to moderate mitral annular calcification present. 5. Tricuspid valve regurgitation is mild-moderate. 6. The aortic valve is tricuspid. Mild thickening of the aortic valve. Mild calcification of the aortic valve. Aortic valve regurgitation is mild by color flow Doppler.   06/04/08: TTE SUMMARY  - Overall left ventricular systolic function was normal. Left     ventricular ejection fraction was estimated to be 65 %. There     were no left ventricular regional wall motion abnormalities.     Left ventricular wall thickness was at the upper limits of     normal. Left ventricular diastolic function parameters were     normal.  - Aortic valve thickness was mildly increased. There was trivial     aortic valvular regurgitation.  - There was mild mitral valvular regurgitation.    Labs/Other Tests and Data Reviewed:     Recent Labs: 12/12/2018: BUN 33; Creatinine, Ser 1.21; Magnesium 2.1; Potassium 4.3; Sodium 140   Recent Lipid Panel Lab Results  Component Value Date/Time   CHOL   06/04/2008 02:30 PM    171        ATP III CLASSIFICATION:  <200     mg/dL   Desirable  200-239  mg/dL   Borderline High  >=240    mg/dL   High          TRIG 144 06/04/2008 02:30 PM   HDL 47 06/04/2008 02:30 PM   CHOLHDL 3.6 06/04/2008 02:30 PM   LDLCALC  06/04/2008 02:30 PM    95        Total Cholesterol/HDL:CHD Risk Coronary Heart Disease Risk Table                     Men   Women  1/2 Average Risk   3.4   3.3  Average Risk       5.0   4.4  2 X Average Risk   9.6   7.1  3 X Average Risk  23.4   11.0        Use the calculated Patient Ratio above and the CHD Risk Table to determine the patient's CHD Risk.        ATP III CLASSIFICATION (LDL):  <100     mg/dL   Optimal  100-129  mg/dL   Near or Above                    Optimal  130-159  mg/dL   Borderline  160-189  mg/dL   High  >190     mg/dL   Very High    Wt Readings from Last 3 Encounters:  06/15/19 205 lb 12.8 oz (93.4 kg)  04/18/19 200 lb (90.7 kg)  03/06/19 197 lb (89.4 kg)     Objective:    Vital Signs:  There were no vitals taken for this visit.   Very pleasant, speaks in full sentences at a normal pace Does not sound in distress  She reports BP this week 139/65, 127/54, 122/53  ASSESSMENT & PLAN:    1. PPM  stable remote, battery estimate 72mo  2. HTN looks good, no change  3. HLD Deferred today  4. PVCs.  She has h/o some PVCs by prior in-clinic check a year ago She does not have palpitations Frequency about the same, slightly less     99.2% VP     No  changes  5. AFib Single 9 minute episode noted on a prior interrogation, none further to date Follow via her device, no a/c just yet    Time:   Today, I have spent 11 minutes with the patient with telehealth technology discussing the above problems.     Medication Adjustments/Labs and Tests Ordered: Current medicines are reviewed at length with the patient today.  Concerns regarding medicines are  outlined above.   Tests Ordered: No orders of the defined types were placed in this encounter.   Medication Changes: No orders of the defined types were placed in this encounter.   Follow Up:  Either In Person or Virtual in 6 month(s)  Signed, Baldwin Jamaica, PA-C  07/13/2019 3:50 PM    [facility]

## 2019-07-14 ENCOUNTER — Telehealth: Payer: Self-pay | Admitting: *Deleted

## 2019-07-14 ENCOUNTER — Telehealth (INDEPENDENT_AMBULATORY_CARE_PROVIDER_SITE_OTHER): Payer: PPO | Admitting: Physician Assistant

## 2019-07-14 ENCOUNTER — Other Ambulatory Visit: Payer: Self-pay

## 2019-07-14 DIAGNOSIS — I10 Essential (primary) hypertension: Secondary | ICD-10-CM

## 2019-07-14 DIAGNOSIS — Z950 Presence of cardiac pacemaker: Secondary | ICD-10-CM

## 2019-07-14 DIAGNOSIS — I493 Ventricular premature depolarization: Secondary | ICD-10-CM | POA: Diagnosis not present

## 2019-07-14 DIAGNOSIS — I480 Paroxysmal atrial fibrillation: Secondary | ICD-10-CM

## 2019-07-14 DIAGNOSIS — I4891 Unspecified atrial fibrillation: Secondary | ICD-10-CM | POA: Diagnosis not present

## 2019-07-14 NOTE — Telephone Encounter (Signed)
Patient states she missed call for virtual appointment scheduled for today, 07/14/19 at 11:45 AM with Tommye Standard. I made Shana aware of this and informed patient that Tommye Standard will return her call in approximately 5 minutes, per Edwena Blow.

## 2019-07-14 NOTE — Telephone Encounter (Signed)
Lvm  Twice  of appointment today and calling to prep for visit  But no answer

## 2019-07-28 ENCOUNTER — Telehealth: Payer: Self-pay | Admitting: Physician Assistant

## 2019-07-28 NOTE — Telephone Encounter (Signed)
New Message    Pt is calling and would like Renee Ursuy's nurse to call her. She says it is in regards to her pacemaker.  She says she is not in pain  She would like a call back today    Please call

## 2019-07-29 ENCOUNTER — Other Ambulatory Visit: Payer: Self-pay | Admitting: Physician Assistant

## 2019-08-11 ENCOUNTER — Ambulatory Visit: Payer: PPO | Admitting: Podiatry

## 2019-08-11 ENCOUNTER — Encounter: Payer: Self-pay | Admitting: Podiatry

## 2019-08-11 ENCOUNTER — Other Ambulatory Visit: Payer: Self-pay

## 2019-08-11 VITALS — Temp 96.7°F

## 2019-08-11 DIAGNOSIS — B351 Tinea unguium: Secondary | ICD-10-CM | POA: Diagnosis not present

## 2019-08-11 DIAGNOSIS — M79675 Pain in left toe(s): Secondary | ICD-10-CM | POA: Diagnosis not present

## 2019-08-11 DIAGNOSIS — M79674 Pain in right toe(s): Secondary | ICD-10-CM | POA: Diagnosis not present

## 2019-08-11 DIAGNOSIS — L600 Ingrowing nail: Secondary | ICD-10-CM

## 2019-08-14 DIAGNOSIS — F4321 Adjustment disorder with depressed mood: Secondary | ICD-10-CM | POA: Diagnosis not present

## 2019-08-14 NOTE — Progress Notes (Signed)
Subjective:   Patient ID: [name], female   DOB: 85 y.o.   MRN: [id number]   HPI Patient presents with 2 separate problems with 1 being concerned about discoloration of her nailbeds and that they get thick hard for her to cut and can be painful when they get too long and also specifically about an incurvated nail bed left hallux that is painful in the medial corner   ROS      Objective:  Physical Exam  Neurovascular status intact with patient's nails found to be thickened bilateral with yellow subungual debris and incurvated left hallux medial border that is painful with distal redness     Assessment:  Mycotic nail infection 1-5 both feet with discomfort along with incurvated nail border left hallux medial border that is painful when pressed     Plan:  H&P reviewed conditions and discussed treatment options.  At this point I went ahead I debrided nailbeds 1-5 both feet with no iatrogenic bleeding taking out the corner and I discussed the possibility for permanent procedure educating her on ingrown toenail and what would be done from the permanent procedure.  At this point we will keep an eye on it and see how she responds

## 2019-08-29 DIAGNOSIS — H903 Sensorineural hearing loss, bilateral: Secondary | ICD-10-CM | POA: Diagnosis not present

## 2019-09-22 ENCOUNTER — Encounter (INDEPENDENT_AMBULATORY_CARE_PROVIDER_SITE_OTHER): Payer: PPO | Admitting: Ophthalmology

## 2019-09-22 ENCOUNTER — Other Ambulatory Visit: Payer: Self-pay

## 2019-09-22 DIAGNOSIS — H353132 Nonexudative age-related macular degeneration, bilateral, intermediate dry stage: Secondary | ICD-10-CM | POA: Diagnosis not present

## 2019-09-22 DIAGNOSIS — H35033 Hypertensive retinopathy, bilateral: Secondary | ICD-10-CM | POA: Diagnosis not present

## 2019-09-22 DIAGNOSIS — I10 Essential (primary) hypertension: Secondary | ICD-10-CM

## 2019-09-22 DIAGNOSIS — H43813 Vitreous degeneration, bilateral: Secondary | ICD-10-CM | POA: Diagnosis not present

## 2019-09-22 DIAGNOSIS — H35372 Puckering of macula, left eye: Secondary | ICD-10-CM | POA: Diagnosis not present

## 2019-10-02 ENCOUNTER — Ambulatory Visit (INDEPENDENT_AMBULATORY_CARE_PROVIDER_SITE_OTHER): Payer: PPO | Admitting: *Deleted

## 2019-10-02 DIAGNOSIS — I441 Atrioventricular block, second degree: Secondary | ICD-10-CM | POA: Diagnosis not present

## 2019-10-02 DIAGNOSIS — M25511 Pain in right shoulder: Secondary | ICD-10-CM | POA: Diagnosis not present

## 2019-10-03 LAB — CUP PACEART REMOTE DEVICE CHECK
Battery Impedance: 2171 Ohm
Battery Remaining Longevity: 30 mo
Battery Voltage: 2.74 V
Brady Statistic AP VP Percent: 45 %
Brady Statistic AP VS Percent: 0 %
Brady Statistic AS VP Percent: 55 %
Brady Statistic AS VS Percent: 1 %
Date Time Interrogation Session: 20210614151948
Implantable Lead Implant Date: 20100217
Implantable Lead Implant Date: 20100217
Implantable Lead Location: 753859
Implantable Lead Location: 753860
Implantable Lead Model: 5076
Implantable Lead Model: 5076
Implantable Pulse Generator Implant Date: 20100217
Lead Channel Impedance Value: 431 Ohm
Lead Channel Impedance Value: 616 Ohm
Lead Channel Pacing Threshold Amplitude: 0.375 V
Lead Channel Pacing Threshold Amplitude: 0.625 V
Lead Channel Pacing Threshold Pulse Width: 0.4 ms
Lead Channel Pacing Threshold Pulse Width: 0.4 ms
Lead Channel Setting Pacing Amplitude: 2 V
Lead Channel Setting Pacing Amplitude: 2.5 V
Lead Channel Setting Pacing Pulse Width: 0.4 ms
Lead Channel Setting Sensing Sensitivity: 2.8 mV

## 2019-10-03 NOTE — Progress Notes (Signed)
Remote pacemaker transmission.   

## 2019-10-11 DIAGNOSIS — M25511 Pain in right shoulder: Secondary | ICD-10-CM | POA: Diagnosis not present

## 2019-10-18 ENCOUNTER — Telehealth: Payer: Self-pay | Admitting: Physician Assistant

## 2019-10-18 DIAGNOSIS — M25511 Pain in right shoulder: Secondary | ICD-10-CM | POA: Diagnosis not present

## 2019-10-18 NOTE — Telephone Encounter (Signed)
° °  Pt c/o medication issue:  1. Name of Medication: propranolol ER (INDERAL LA) 160 MG SR capsule  2. How are you currently taking this medication (dosage and times per day)? TAKE 1 CAPSULE (160 MG TOTAL) BY MOUTH DAILY.  3. Are you having a reaction (difficulty breathing--STAT)?   4. What is your medication issue? Pt would like to know if Renee check her last transmission on 10/02/2019 and if she still see the extra heart beat and if she still need to continue taking this medication

## 2019-10-19 NOTE — Telephone Encounter (Signed)
According to [employer], the transmission received on 10/02/19 was normal device function.

## 2019-10-20 DIAGNOSIS — M25511 Pain in right shoulder: Secondary | ICD-10-CM | POA: Diagnosis not present

## 2019-10-25 DIAGNOSIS — M65342 Trigger finger, left ring finger: Secondary | ICD-10-CM | POA: Insufficient documentation

## 2019-10-25 DIAGNOSIS — M79645 Pain in left finger(s): Secondary | ICD-10-CM | POA: Diagnosis not present

## 2019-10-25 DIAGNOSIS — M13849 Other specified arthritis, unspecified hand: Secondary | ICD-10-CM | POA: Diagnosis not present

## 2019-10-26 NOTE — Telephone Encounter (Signed)
Spoke with the patient and advised her on the following from [place], [place].   Baldwin Jamaica, PA-C to Claude Manges, CMA     8:29 AM Please continue the propanolol. She continues to have the pVCs, but think this has stabilized.    Patient verbalized understanding.

## 2019-10-26 NOTE — Telephone Encounter (Signed)
Follow up   Patient wants to know if [name] is still seeing an extra heart beat after her b/p medication has been changed. Please advise.

## 2019-10-26 NOTE — Telephone Encounter (Signed)
,   [name] 13 minutes ago (12:14 PM)  CJ   Follow up   Patient wants to know if [name] is still seeing an extra heart beat after her b/p medication has been changed. Please advise.      Documentation      See above note that was placed in previous phone note.   Spoke with the patient and advised her that PVCs are still present but are stable and to continue on propanolol. Patient verbalized understanding.

## 2019-10-27 DIAGNOSIS — M25511 Pain in right shoulder: Secondary | ICD-10-CM | POA: Diagnosis not present

## 2019-10-31 DIAGNOSIS — M25511 Pain in right shoulder: Secondary | ICD-10-CM | POA: Diagnosis not present

## 2019-11-02 DIAGNOSIS — M25511 Pain in right shoulder: Secondary | ICD-10-CM | POA: Diagnosis not present

## 2019-11-07 DIAGNOSIS — M25511 Pain in right shoulder: Secondary | ICD-10-CM | POA: Diagnosis not present

## 2019-11-10 DIAGNOSIS — M25511 Pain in right shoulder: Secondary | ICD-10-CM | POA: Diagnosis not present

## 2019-11-17 ENCOUNTER — Other Ambulatory Visit: Payer: Self-pay

## 2019-11-17 ENCOUNTER — Ambulatory Visit: Payer: PPO | Admitting: Podiatry

## 2019-11-17 ENCOUNTER — Encounter: Payer: Self-pay | Admitting: Podiatry

## 2019-11-17 DIAGNOSIS — M205X2 Other deformities of toe(s) (acquired), left foot: Secondary | ICD-10-CM | POA: Diagnosis not present

## 2019-11-17 DIAGNOSIS — M205X1 Other deformities of toe(s) (acquired), right foot: Secondary | ICD-10-CM

## 2019-11-17 DIAGNOSIS — M79675 Pain in left toe(s): Secondary | ICD-10-CM | POA: Diagnosis not present

## 2019-11-17 DIAGNOSIS — B351 Tinea unguium: Secondary | ICD-10-CM | POA: Diagnosis not present

## 2019-11-17 DIAGNOSIS — M79674 Pain in right toe(s): Secondary | ICD-10-CM

## 2019-11-17 NOTE — Progress Notes (Signed)
This patient returns to the office for evaluation and treatment of long thick painful nails .  This patient is unable to trim her own nails since the patient cannot reach her feet.  Patient says the nails are painful walking and wearing his shoes.  He returns for preventive foot care services. She also says she experiences numbness and pain in her toes in the evening.  She is concerned about neuropathy.  General Appearance  Alert, conversant and in no acute stress.  Vascular  Dorsalis pedis and posterior tibial  pulses are palpable  bilaterally.  Capillary return is within normal limits  bilaterally. Temperature is within normal limits  bilaterally.  Neurologic  Senn-Weinstein monofilament wire test within normal limits  bilaterally. Muscle power within normal limits bilaterally.  Nails Thick disfigured discolored nails with subungual debris  from hallux to fifth toes bilaterally. No evidence of bacterial infection or drainage bilaterally.  Orthopedic  No limitations of motion  feet .  No crepitus or effusions noted.  No bony pathology or digital deformities noted. Functional hallux limitus.  Skin  normotropic skin with no porokeratosis noted bilaterally.  No signs of infections or ulcers noted.     Onychomycosis  Pain in toes right foot  Pain in toes left foot  Debridement  of nails  1-5  B/L with a nail nipper.  Nails were then filed using a dremel tool with no incidents.   Discussed her numbness in her digits with this patient.  Told her she has limitation of motion big toe joint both feet.  This alters her gait and leads to numbness.  Told her to wear thick soled shoes and to continually take alleve as needed.  RTC 3 months.   Gardiner Barefoot DPM

## 2019-12-06 DIAGNOSIS — M65342 Trigger finger, left ring finger: Secondary | ICD-10-CM | POA: Diagnosis not present

## 2019-12-27 ENCOUNTER — Other Ambulatory Visit: Payer: Self-pay | Admitting: Internal Medicine

## 2019-12-27 DIAGNOSIS — Z1231 Encounter for screening mammogram for malignant neoplasm of breast: Secondary | ICD-10-CM

## 2020-01-01 ENCOUNTER — Ambulatory Visit (INDEPENDENT_AMBULATORY_CARE_PROVIDER_SITE_OTHER): Payer: PPO | Admitting: *Deleted

## 2020-01-01 DIAGNOSIS — I441 Atrioventricular block, second degree: Secondary | ICD-10-CM | POA: Diagnosis not present

## 2020-01-03 LAB — CUP PACEART REMOTE DEVICE CHECK
Battery Impedance: 2390 Ohm
Battery Remaining Longevity: 28 mo
Battery Voltage: 2.72 V
Brady Statistic AP VP Percent: 38 %
Brady Statistic AP VS Percent: 0 %
Brady Statistic AS VP Percent: 62 %
Brady Statistic AS VS Percent: 1 %
Date Time Interrogation Session: 20210913150211
Implantable Lead Implant Date: 20100217
Implantable Lead Implant Date: 20100217
Implantable Lead Location: 753859
Implantable Lead Location: 753860
Implantable Lead Model: 5076
Implantable Lead Model: 5076
Implantable Pulse Generator Implant Date: 20100217
Lead Channel Impedance Value: 468 Ohm
Lead Channel Impedance Value: 664 Ohm
Lead Channel Pacing Threshold Amplitude: 0.375 V
Lead Channel Pacing Threshold Amplitude: 0.75 V
Lead Channel Pacing Threshold Pulse Width: 0.4 ms
Lead Channel Pacing Threshold Pulse Width: 0.4 ms
Lead Channel Setting Pacing Amplitude: 2 V
Lead Channel Setting Pacing Amplitude: 2.5 V
Lead Channel Setting Pacing Pulse Width: 0.4 ms
Lead Channel Setting Sensing Sensitivity: 2.8 mV

## 2020-01-03 NOTE — Progress Notes (Signed)
Remote pacemaker transmission.   

## 2020-01-05 DIAGNOSIS — L720 Epidermal cyst: Secondary | ICD-10-CM | POA: Diagnosis not present

## 2020-01-05 DIAGNOSIS — Z85828 Personal history of other malignant neoplasm of skin: Secondary | ICD-10-CM | POA: Diagnosis not present

## 2020-01-05 DIAGNOSIS — D1801 Hemangioma of skin and subcutaneous tissue: Secondary | ICD-10-CM | POA: Diagnosis not present

## 2020-01-05 DIAGNOSIS — L821 Other seborrheic keratosis: Secondary | ICD-10-CM | POA: Diagnosis not present

## 2020-01-05 DIAGNOSIS — L814 Other melanin hyperpigmentation: Secondary | ICD-10-CM | POA: Diagnosis not present

## 2020-01-05 DIAGNOSIS — D2271 Melanocytic nevi of right lower limb, including hip: Secondary | ICD-10-CM | POA: Diagnosis not present

## 2020-01-29 ENCOUNTER — Ambulatory Visit
Admission: RE | Admit: 2020-01-29 | Discharge: 2020-01-29 | Disposition: A | Discharge status: Home / Self Care | Payer: PPO | Source: Ambulatory Visit | Attending: Internal Medicine | Admitting: Internal Medicine

## 2020-01-29 ENCOUNTER — Other Ambulatory Visit: Payer: Self-pay

## 2020-01-29 DIAGNOSIS — Z1231 Encounter for screening mammogram for malignant neoplasm of breast: Secondary | ICD-10-CM | POA: Diagnosis not present

## 2020-02-23 ENCOUNTER — Ambulatory Visit: Payer: PPO | Admitting: Podiatry

## 2020-03-12 DIAGNOSIS — M5459 Other low back pain: Secondary | ICD-10-CM | POA: Diagnosis not present

## 2020-03-26 DIAGNOSIS — J310 Chronic rhinitis: Secondary | ICD-10-CM | POA: Diagnosis not present

## 2020-03-26 DIAGNOSIS — Z1152 Encounter for screening for COVID-19: Secondary | ICD-10-CM | POA: Diagnosis not present

## 2020-03-26 DIAGNOSIS — R059 Cough, unspecified: Secondary | ICD-10-CM | POA: Diagnosis not present

## 2020-03-26 DIAGNOSIS — G4733 Obstructive sleep apnea (adult) (pediatric): Secondary | ICD-10-CM | POA: Diagnosis not present

## 2020-03-26 DIAGNOSIS — J441 Chronic obstructive pulmonary disease with (acute) exacerbation: Secondary | ICD-10-CM | POA: Diagnosis not present

## 2020-03-26 DIAGNOSIS — J189 Pneumonia, unspecified organism: Secondary | ICD-10-CM | POA: Diagnosis not present

## 2020-04-01 ENCOUNTER — Ambulatory Visit (INDEPENDENT_AMBULATORY_CARE_PROVIDER_SITE_OTHER): Payer: PPO

## 2020-04-01 DIAGNOSIS — S92405A Nondisplaced unspecified fracture of left great toe, initial encounter for closed fracture: Secondary | ICD-10-CM | POA: Diagnosis not present

## 2020-04-01 DIAGNOSIS — M79675 Pain in left toe(s): Secondary | ICD-10-CM | POA: Diagnosis not present

## 2020-04-01 DIAGNOSIS — I441 Atrioventricular block, second degree: Secondary | ICD-10-CM | POA: Diagnosis not present

## 2020-04-01 DIAGNOSIS — S90422A Blister (nonthermal), left great toe, initial encounter: Secondary | ICD-10-CM | POA: Diagnosis not present

## 2020-04-03 LAB — CUP PACEART REMOTE DEVICE CHECK
Battery Impedance: 3019 Ohm
Battery Remaining Longevity: 22 mo
Battery Voltage: 2.71 V
Brady Statistic AP VP Percent: 34 %
Brady Statistic AP VS Percent: 0 %
Brady Statistic AS VP Percent: 65 %
Brady Statistic AS VS Percent: 1 %
Date Time Interrogation Session: 20211214163524
Implantable Lead Implant Date: 20100217
Implantable Lead Implant Date: 20100217
Implantable Lead Location: 753859
Implantable Lead Location: 753860
Implantable Lead Model: 5076
Implantable Lead Model: 5076
Implantable Pulse Generator Implant Date: 20100217
Lead Channel Impedance Value: 507 Ohm
Lead Channel Impedance Value: 659 Ohm
Lead Channel Pacing Threshold Amplitude: 0.375 V
Lead Channel Pacing Threshold Amplitude: 0.625 V
Lead Channel Pacing Threshold Pulse Width: 0.4 ms
Lead Channel Pacing Threshold Pulse Width: 0.4 ms
Lead Channel Setting Pacing Amplitude: 2 V
Lead Channel Setting Pacing Amplitude: 2.5 V
Lead Channel Setting Pacing Pulse Width: 0.4 ms
Lead Channel Setting Sensing Sensitivity: 2.8 mV

## 2020-04-09 DIAGNOSIS — J189 Pneumonia, unspecified organism: Secondary | ICD-10-CM | POA: Diagnosis not present

## 2020-04-09 DIAGNOSIS — R059 Cough, unspecified: Secondary | ICD-10-CM | POA: Diagnosis not present

## 2020-04-15 DIAGNOSIS — S92405D Nondisplaced unspecified fracture of left great toe, subsequent encounter for fracture with routine healing: Secondary | ICD-10-CM | POA: Diagnosis not present

## 2020-04-16 NOTE — Progress Notes (Signed)
Remote pacemaker transmission.   

## 2020-05-14 DIAGNOSIS — G4733 Obstructive sleep apnea (adult) (pediatric): Secondary | ICD-10-CM | POA: Diagnosis not present

## 2020-05-21 DIAGNOSIS — M79675 Pain in left toe(s): Secondary | ICD-10-CM | POA: Diagnosis not present

## 2020-06-10 DIAGNOSIS — M65342 Trigger finger, left ring finger: Secondary | ICD-10-CM | POA: Diagnosis not present

## 2020-06-12 ENCOUNTER — Encounter: Payer: Self-pay | Admitting: Internal Medicine

## 2020-06-13 NOTE — Progress Notes (Signed)
Patient ID: [name], female    DOB: 09-03-1934, 86 y.o.   MRN: [id number]  HPI Female never smoker followed for cough/ GERD, OSA/CPAP, complicated by HBP, [id number]/ Pacemaker PFT at [facility] 03/18/10 showed mild reversible small  airway obstruction  -------------------------------------------------------------------------------------------------------.   06/15/19- 86 year old female never smoker followed for OSA/CPAP, Cough/ GERD,  complicated by HBP, [id number]/ pacemaker CPAP auto 7-15/ [employer] Download compliance 93%, AHI 0.6/ hr Body weight today 205 lbs Some cough. Wakes in AM with brown mucus/ bad taste in mouth. Usually no cough or sinus discomfort through the day. Biotene mouth gel helps. Husband dies Parkinson's 2 months ago.  CXR 06/13/2018- IMPRESSION: No active cardiopulmonary disease.  06/14/20-  86 year old female never smoker followed for OSA/CPAP, Cough/ GERD,  complicated by HBP, [id number]/ pacemaker CPAP auto 7-15/ [employer]- compliance 97%, AHI 2/ hr Body weight today- Covid vax-3 Phizer Flu vax- had -----Patient states that she has had pneumonia since being here last, has shortness of breath with exertion. She was short of breath walking back to room from lobby. Dry cough. CPAP Machine is working good but states she is having a lot of trouble falling asleep  Since her husband died 2 yrs ago and then son moved out, she is alone. Gets busy brain at night, fretting, and has trouble falling asleep before about 11:30. May read. Goes back to bed in AM after letting dog out and may be in bed till 10AM. No naps. Melatonin no help.   Pneumonia in December, Covid neg, Rx's outpatient by PCP w CXR reported pneumonia. Since then some persistent dry cough- intermittent.   Review of Systems-See HPI + = positive Constitutional:   No-   weight loss, night sweats, fevers, chills, fatigue, lassitude. HEENT:   No-  headaches, difficulty swallowing, tooth/dental  problems, sore throat,       No-  sneezing, itching, ear ache, nasal congestion, post nasal drip,  CV:  No-   chest pain, orthopnea, PND, swelling in lower extremities, anasarca, as dizziness, palpitations Resp: No-   shortness of breath with exertion or at rest.               productive cough,  + non-productive cough,  No- coughing up of blood.               change in color of mucus.  No- wheezing.   Skin: No-   rash or lesions. GI:  No-   heartburn, indigestion, abdominal pain, nausea, vomiting,  GU:  MS:  No-   joint pain or swelling.   Neuro-     nothing unusual Psych:  No- change in mood or affect. No depression or anxiety.  No memory loss.    Objective:   Physical Exam General- Alert, Oriented, Affect-appropriate, Distress- none acute, + obese Skin- rash-none, lesions- none, excoriation- none Lymphadenopathy- none Head- atraumatic            Eyes- Gross vision intact, PERRLA, conjunctivae clear secretions            Ears- Hearing, canals-normal            Nose- Clear, no-Septal dev, mucosa clear polyps, erosion, perforation             Throat- Mallampati III-IV , mucosa clear , drainage- none, tonsils- atrophic Neck- flexible , trachea midline, no stridor , thyroid nl, carotid no bruit Chest - symmetrical excursion , unlabored           Heart/CV- RRR ,  no murmur , no gallop  , no rub, nl s1 s2                           - JVD- none , edema- none, stasis changes- none, varices- none           Lung- + mild tachypnea during speech, wheeze-none cough- none , dullness-none, rub- none. +somewhat breathless talking through N95 mask.           Chest wall-+ L pacemaker Abd-  Br/ Gen/ Rectal- Not done, not indicated Extrem- cyanosis- none, clubbing, none, atrophy- none, strength- nl Neuro- grossly intact to observation

## 2020-06-14 ENCOUNTER — Encounter: Payer: Self-pay | Admitting: Internal Medicine

## 2020-06-14 ENCOUNTER — Ambulatory Visit: Payer: PPO | Admitting: Internal Medicine

## 2020-06-14 ENCOUNTER — Other Ambulatory Visit: Payer: Self-pay

## 2020-06-14 DIAGNOSIS — G4733 Obstructive sleep apnea (adult) (pediatric): Secondary | ICD-10-CM | POA: Diagnosis not present

## 2020-06-14 DIAGNOSIS — F5101 Primary insomnia: Secondary | ICD-10-CM

## 2020-06-14 DIAGNOSIS — G4700 Insomnia, unspecified: Secondary | ICD-10-CM | POA: Insufficient documentation

## 2020-06-14 MED ORDER — ZALEPLON 5 MG PO CAPS: 5.0000 mg | ORAL_CAPSULE | Freq: Every evening | ORAL | 5 refills | Status: DC | PRN

## 2020-06-14 NOTE — Assessment & Plan Note (Signed)
Benefits from CPAP with good compliance and control. She had questions about [employer] which we discussed, but she is too heavy for that. Plan- continue CPAP auto 7-15

## 2020-06-14 NOTE — Assessment & Plan Note (Signed)
Sleep hygiene and expectations discussed. We may be able to help her initiate sleep with short half-life sonata. Safety discussion. Plan- Try sonata 5 mg as needed

## 2020-06-14 NOTE — Patient Instructions (Signed)
We can continue CPAP auto 7-15  Script sent to try Sonata( zaleplon) 5 mg at bedtime for sleep if needed  Please call if we can help

## 2020-06-18 DIAGNOSIS — S92412D Displaced fracture of proximal phalanx of left great toe, subsequent encounter for fracture with routine healing: Secondary | ICD-10-CM | POA: Diagnosis not present

## 2020-06-20 DIAGNOSIS — M65342 Trigger finger, left ring finger: Secondary | ICD-10-CM | POA: Diagnosis not present

## 2020-06-20 DIAGNOSIS — Z4789 Encounter for other orthopedic aftercare: Secondary | ICD-10-CM | POA: Diagnosis not present

## 2020-06-27 ENCOUNTER — Other Ambulatory Visit: Payer: Self-pay | Admitting: Physician Assistant

## 2020-07-01 ENCOUNTER — Ambulatory Visit: Payer: PPO

## 2020-07-03 ENCOUNTER — Telehealth: Payer: Self-pay

## 2020-07-03 DIAGNOSIS — E785 Hyperlipidemia, unspecified: Secondary | ICD-10-CM | POA: Diagnosis not present

## 2020-07-03 LAB — CUP PACEART REMOTE DEVICE CHECK
Battery Impedance: 4176 Ohm
Battery Remaining Longevity: 12 mo
Battery Voltage: 2.68 V
Brady Statistic AP VP Percent: 33 %
Brady Statistic AP VS Percent: 0 %
Brady Statistic AS VP Percent: 66 %
Brady Statistic AS VS Percent: 1 %
Date Time Interrogation Session: 20220315115701
Implantable Lead Implant Date: 20100217
Implantable Lead Implant Date: 20100217
Implantable Lead Location: 753859
Implantable Lead Location: 753860
Implantable Lead Model: 5076
Implantable Lead Model: 5076
Implantable Pulse Generator Implant Date: 20100217
Lead Channel Impedance Value: 471 Ohm
Lead Channel Impedance Value: 631 Ohm
Lead Channel Pacing Threshold Amplitude: 0.375 V
Lead Channel Pacing Threshold Amplitude: 0.875 V
Lead Channel Pacing Threshold Pulse Width: 0.4 ms
Lead Channel Pacing Threshold Pulse Width: 0.4 ms
Lead Channel Setting Pacing Amplitude: 2 V
Lead Channel Setting Pacing Amplitude: 2.5 V
Lead Channel Setting Pacing Pulse Width: 0.4 ms
Lead Channel Setting Sensing Sensitivity: 2.8 mV

## 2020-07-03 NOTE — Telephone Encounter (Signed)
Carelink alert received today indicating device est time to ERI=12 months. Remotes scheduled approptiately. Patient is noted to be manual transmission. Unsuccessful telephone encounter to patient to notify of change in remote scheduling. Hip[employer] compliant VM message left requesting call back to [phone].

## 2020-07-04 ENCOUNTER — Telehealth: Payer: Self-pay

## 2020-07-04 DIAGNOSIS — M79645 Pain in left finger(s): Secondary | ICD-10-CM | POA: Diagnosis not present

## 2020-07-04 NOTE — Telephone Encounter (Signed)
Patient left a message returning [place] phone call. She states she will be at home this evening and would like for the nurse to call her back.

## 2020-07-05 NOTE — Telephone Encounter (Signed)
Spoke with pt, advised of battery nearing ERI, monthly battery checks.  Next scheduled check 08/05/20.  Pt v/u to send manual transmission on this date.

## 2020-07-09 DIAGNOSIS — Z1339 Encounter for screening examination for other mental health and behavioral disorders: Secondary | ICD-10-CM | POA: Diagnosis not present

## 2020-07-09 DIAGNOSIS — N3281 Overactive bladder: Secondary | ICD-10-CM | POA: Diagnosis not present

## 2020-07-09 DIAGNOSIS — Z0000 Encounter for general adult medical examination without abnormal findings: Secondary | ICD-10-CM | POA: Diagnosis not present

## 2020-07-09 DIAGNOSIS — K219 Gastro-esophageal reflux disease without esophagitis: Secondary | ICD-10-CM | POA: Diagnosis not present

## 2020-07-09 DIAGNOSIS — E785 Hyperlipidemia, unspecified: Secondary | ICD-10-CM | POA: Diagnosis not present

## 2020-07-09 DIAGNOSIS — G4733 Obstructive sleep apnea (adult) (pediatric): Secondary | ICD-10-CM | POA: Diagnosis not present

## 2020-07-09 DIAGNOSIS — Z1331 Encounter for screening for depression: Secondary | ICD-10-CM | POA: Diagnosis not present

## 2020-07-09 DIAGNOSIS — I10 Essential (primary) hypertension: Secondary | ICD-10-CM | POA: Diagnosis not present

## 2020-07-09 DIAGNOSIS — Z9181 History of falling: Secondary | ICD-10-CM | POA: Diagnosis not present

## 2020-07-09 DIAGNOSIS — J310 Chronic rhinitis: Secondary | ICD-10-CM | POA: Diagnosis not present

## 2020-07-19 ENCOUNTER — Telehealth: Payer: Self-pay | Admitting: Internal Medicine

## 2020-07-19 NOTE — Telephone Encounter (Signed)
  [name] @ Dr Shon Baton office is calling because Dr Philip Aspen is concerned her propranolol ER (INDERAL LA) 160 MG SR capsule may be worsening her asthma and would like to see if Dr Rayann Heman would be okay with switching her to Toprol XL 50 mg once daily. Please let Dr Philip Aspen know

## 2020-07-19 NOTE — Telephone Encounter (Signed)
Office is closed when trying to call back. Dr. Rayann Heman is okay with switch.

## 2020-07-22 NOTE — Telephone Encounter (Signed)
Left detailed message on work voicemail that switch was okay per Dr. Rayann Heman and to call office with any questions.

## 2020-07-30 DIAGNOSIS — S92415D Nondisplaced fracture of proximal phalanx of left great toe, subsequent encounter for fracture with routine healing: Secondary | ICD-10-CM | POA: Diagnosis not present

## 2020-08-05 ENCOUNTER — Ambulatory Visit (INDEPENDENT_AMBULATORY_CARE_PROVIDER_SITE_OTHER): Payer: PPO

## 2020-08-05 DIAGNOSIS — I441 Atrioventricular block, second degree: Secondary | ICD-10-CM

## 2020-08-07 LAB — CUP PACEART REMOTE DEVICE CHECK
Battery Impedance: 5065 Ohm
Battery Remaining Longevity: 7 mo
Battery Voltage: 2.65 V
Brady Statistic AP VP Percent: 33 %
Brady Statistic AP VS Percent: 0 %
Brady Statistic AS VP Percent: 66 %
Brady Statistic AS VS Percent: 1 %
Date Time Interrogation Session: 20220418090006
Implantable Lead Implant Date: 20100217
Implantable Lead Implant Date: 20100217
Implantable Lead Location: 753859
Implantable Lead Location: 753860
Implantable Lead Model: 5076
Implantable Lead Model: 5076
Implantable Pulse Generator Implant Date: 20100217
Lead Channel Impedance Value: 473 Ohm
Lead Channel Impedance Value: 692 Ohm
Lead Channel Pacing Threshold Amplitude: 0.375 V
Lead Channel Pacing Threshold Amplitude: 0.625 V
Lead Channel Pacing Threshold Pulse Width: 0.4 ms
Lead Channel Pacing Threshold Pulse Width: 0.4 ms
Lead Channel Setting Pacing Amplitude: 2 V
Lead Channel Setting Pacing Amplitude: 2.5 V
Lead Channel Setting Pacing Pulse Width: 0.4 ms
Lead Channel Setting Sensing Sensitivity: 2.8 mV

## 2020-08-16 DIAGNOSIS — H35372 Puckering of macula, left eye: Secondary | ICD-10-CM | POA: Diagnosis not present

## 2020-08-16 DIAGNOSIS — H35033 Hypertensive retinopathy, bilateral: Secondary | ICD-10-CM | POA: Diagnosis not present

## 2020-08-16 DIAGNOSIS — H35373 Puckering of macula, bilateral: Secondary | ICD-10-CM | POA: Diagnosis not present

## 2020-08-16 DIAGNOSIS — H524 Presbyopia: Secondary | ICD-10-CM | POA: Diagnosis not present

## 2020-08-16 DIAGNOSIS — H353132 Nonexudative age-related macular degeneration, bilateral, intermediate dry stage: Secondary | ICD-10-CM | POA: Diagnosis not present

## 2020-08-22 NOTE — Progress Notes (Signed)
Remote pacemaker transmission.   

## 2020-08-22 NOTE — Addendum Note (Signed)
Addended by: Cheri Kearns A on: 08/22/2020 12:06 PM   Modules accepted: Level of Service

## 2020-09-02 ENCOUNTER — Telehealth: Payer: Self-pay | Admitting: Internal Medicine

## 2020-09-02 NOTE — Telephone Encounter (Signed)
Pt is calling in regards to her monthly checks, she is calling to see if her check was received 08/30/20

## 2020-09-02 NOTE — Telephone Encounter (Signed)
Patient called and aware that monthly transmission received. 4 months until RRT. Monthly remotes scheduled and dates verified with patient.

## 2020-09-09 ENCOUNTER — Telehealth: Payer: Self-pay | Admitting: Internal Medicine

## 2020-09-09 ENCOUNTER — Ambulatory Visit (INDEPENDENT_AMBULATORY_CARE_PROVIDER_SITE_OTHER): Payer: PPO

## 2020-09-09 DIAGNOSIS — I441 Atrioventricular block, second degree: Secondary | ICD-10-CM

## 2020-09-09 NOTE — Telephone Encounter (Signed)
Left message to call back with any further questions. No events found on monitor to cause symptoms.

## 2020-09-09 NOTE — Telephone Encounter (Signed)
Called patient and requested patient send manual transmission. Patient states she can send a transmission in about 15 minutes. Advised patient we will call her back after it is received.

## 2020-09-09 NOTE — Telephone Encounter (Signed)
[employer] transmission received 1 NSVT event logged back in December 2021. No events since. Presenting rhythm AS/VP 70's. Advised I will forward back to Goodwin, RN to discuss further.

## 2020-09-09 NOTE — Telephone Encounter (Signed)
Patient c/o Palpitations:  High priority if patient c/o lightheadedness, shortness of breath, or chest pain  1) How long have you had palpitations/irregular HR/ Afib? Are you having the symptoms now? Pt stated her HR is high.    2) Are you currently experiencing lightheadedness, SOB or CP? Some Lightheadedness and a little headache   3) Do you have a history of afib (atrial fibrillation) or irregular heart rhythm? No   4) Have you checked your BP or HR? (document readings if available):   Bp was 9 am  122/67 HR for the last for day has been running between 80 to 95  5) Are you experiencing any other symptoms?     Best number is [phone] Pt PCP told her to call us to tell us know what is going on.

## 2020-09-10 LAB — CUP PACEART REMOTE DEVICE CHECK
Battery Impedance: 5863 Ohm
Battery Remaining Longevity: 2 mo
Battery Voltage: 2.63 V
Brady Statistic AP VP Percent: 31 %
Brady Statistic AP VS Percent: 0 %
Brady Statistic AS VP Percent: 68 %
Brady Statistic AS VS Percent: 1 %
Date Time Interrogation Session: 20220523134747
Implantable Lead Implant Date: 20100217
Implantable Lead Implant Date: 20100217
Implantable Lead Location: 753859
Implantable Lead Location: 753860
Implantable Lead Model: 5076
Implantable Lead Model: 5076
Implantable Pulse Generator Implant Date: 20100217
Lead Channel Impedance Value: 455 Ohm
Lead Channel Impedance Value: 635 Ohm
Lead Channel Pacing Threshold Amplitude: 0.5 V
Lead Channel Pacing Threshold Amplitude: 0.625 V
Lead Channel Pacing Threshold Pulse Width: 0.4 ms
Lead Channel Pacing Threshold Pulse Width: 0.4 ms
Lead Channel Setting Pacing Amplitude: 2 V
Lead Channel Setting Pacing Amplitude: 2.5 V
Lead Channel Setting Pacing Pulse Width: 0.4 ms
Lead Channel Setting Sensing Sensitivity: 2.8 mV

## 2020-09-27 ENCOUNTER — Encounter (INDEPENDENT_AMBULATORY_CARE_PROVIDER_SITE_OTHER): Payer: PPO | Admitting: Ophthalmology

## 2020-09-27 ENCOUNTER — Other Ambulatory Visit: Payer: Self-pay

## 2020-09-27 DIAGNOSIS — H353132 Nonexudative age-related macular degeneration, bilateral, intermediate dry stage: Secondary | ICD-10-CM | POA: Diagnosis not present

## 2020-09-27 DIAGNOSIS — H43813 Vitreous degeneration, bilateral: Secondary | ICD-10-CM

## 2020-09-27 DIAGNOSIS — H35372 Puckering of macula, left eye: Secondary | ICD-10-CM

## 2020-09-27 DIAGNOSIS — H35033 Hypertensive retinopathy, bilateral: Secondary | ICD-10-CM | POA: Diagnosis not present

## 2020-09-27 DIAGNOSIS — I10 Essential (primary) hypertension: Secondary | ICD-10-CM

## 2020-09-30 NOTE — Addendum Note (Signed)
Addended by: Douglass Rivers D on: 09/30/2020 03:05 PM   Modules accepted: Level of Service

## 2020-09-30 NOTE — Progress Notes (Signed)
Remote pacemaker transmission.   

## 2020-10-14 ENCOUNTER — Ambulatory Visit (INDEPENDENT_AMBULATORY_CARE_PROVIDER_SITE_OTHER): Payer: PPO

## 2020-10-14 DIAGNOSIS — I441 Atrioventricular block, second degree: Secondary | ICD-10-CM

## 2020-10-15 ENCOUNTER — Telehealth: Payer: Self-pay

## 2020-10-15 LAB — CUP PACEART REMOTE DEVICE CHECK
Battery Impedance: 6743 Ohm
Battery Remaining Longevity: 1 mo — CL
Battery Voltage: 2.61 V
Brady Statistic AP VP Percent: 30 %
Brady Statistic AP VS Percent: 0 %
Brady Statistic AS VP Percent: 69 %
Brady Statistic AS VS Percent: 1 %
Date Time Interrogation Session: 20220627173143
Implantable Lead Implant Date: 20100217
Implantable Lead Implant Date: 20100217
Implantable Lead Location: 753859
Implantable Lead Location: 753860
Implantable Lead Model: 5076
Implantable Lead Model: 5076
Implantable Pulse Generator Implant Date: 20100217
Lead Channel Impedance Value: 461 Ohm
Lead Channel Impedance Value: 631 Ohm
Lead Channel Pacing Threshold Amplitude: 0.5 V
Lead Channel Pacing Threshold Amplitude: 0.625 V
Lead Channel Pacing Threshold Pulse Width: 0.4 ms
Lead Channel Pacing Threshold Pulse Width: 0.4 ms
Lead Channel Setting Pacing Amplitude: 2 V
Lead Channel Setting Pacing Amplitude: 2.5 V
Lead Channel Setting Pacing Pulse Width: 0.4 ms
Lead Channel Setting Sensing Sensitivity: 2.8 mV

## 2020-10-15 NOTE — Telephone Encounter (Signed)
Carelink alert received for battery longevity <1 month. Patient currently scheduled for monthly checks however today frequency has been increased to weekly. Successful telephone encounter to patient to provide dates of transmission as patients device is a manual send. Patient assured that once device reaches ERI, she will be scheduled for office visit with Dr. Rayann Heman to discuss gen change. Patient appreciative of call.

## 2020-10-22 ENCOUNTER — Ambulatory Visit (INDEPENDENT_AMBULATORY_CARE_PROVIDER_SITE_OTHER): Payer: PPO

## 2020-10-22 DIAGNOSIS — I441 Atrioventricular block, second degree: Secondary | ICD-10-CM | POA: Diagnosis not present

## 2020-10-22 LAB — CUP PACEART REMOTE DEVICE CHECK
Battery Impedance: 7528 Ohm
Battery Remaining Longevity: 1 mo — CL
Battery Voltage: 2.59 V
Brady Statistic AP VP Percent: 29 %
Brady Statistic AP VS Percent: 0 %
Brady Statistic AS VP Percent: 70 %
Brady Statistic AS VS Percent: 1 %
Date Time Interrogation Session: 20220705142029
Implantable Lead Implant Date: 20100217
Implantable Lead Implant Date: 20100217
Implantable Lead Location: 753859
Implantable Lead Location: 753860
Implantable Lead Model: 5076
Implantable Lead Model: 5076
Implantable Pulse Generator Implant Date: 20100217
Lead Channel Impedance Value: 445 Ohm
Lead Channel Impedance Value: 706 Ohm
Lead Channel Pacing Threshold Amplitude: 0.375 V
Lead Channel Pacing Threshold Amplitude: 0.625 V
Lead Channel Pacing Threshold Pulse Width: 0.4 ms
Lead Channel Pacing Threshold Pulse Width: 0.4 ms
Lead Channel Setting Pacing Amplitude: 2 V
Lead Channel Setting Pacing Amplitude: 2.5 V
Lead Channel Setting Pacing Pulse Width: 0.4 ms
Lead Channel Setting Sensing Sensitivity: 2.8 mV

## 2020-10-29 DIAGNOSIS — I10 Essential (primary) hypertension: Secondary | ICD-10-CM | POA: Diagnosis not present

## 2020-10-29 DIAGNOSIS — G4733 Obstructive sleep apnea (adult) (pediatric): Secondary | ICD-10-CM | POA: Diagnosis not present

## 2020-10-29 DIAGNOSIS — F419 Anxiety disorder, unspecified: Secondary | ICD-10-CM | POA: Diagnosis not present

## 2020-10-29 DIAGNOSIS — R0609 Other forms of dyspnea: Secondary | ICD-10-CM | POA: Diagnosis not present

## 2020-10-29 DIAGNOSIS — Z950 Presence of cardiac pacemaker: Secondary | ICD-10-CM | POA: Diagnosis not present

## 2020-10-30 ENCOUNTER — Encounter: Payer: Self-pay | Admitting: Internal Medicine

## 2020-10-30 ENCOUNTER — Telehealth: Payer: Self-pay

## 2020-10-30 NOTE — Telephone Encounter (Signed)
Error

## 2020-10-30 NOTE — Telephone Encounter (Signed)
Pt c/o Shortness Of Breath: STAT if SOB developed within the last 24 hours or pt is noticeably SOB on the phone  1. Are you currently SOB (can you hear that pt is SOB on the phone)?  Anderson Malta with [facility] is not currently with the patient 2. How long have you been experiencing SOB?  About 2-3 weeks, worsening over the past week  3. Are you SOB when sitting or when up moving around?  With exertion  4. Are you currently experiencing any other symptoms?  Fatigue

## 2020-10-30 NOTE — Telephone Encounter (Signed)
[facility] alert received that device is at RRT/ERI 10/23/20. Patient notified and informed she will need appointment with Dr. Rayann Heman to discuss gen change. Patient appreciative of call. States she is heading out of town. She request scheduler to call home number and leave appt time and date and she (patient) will arrange personal schedule to accommodate medical appointment. Device programmed DDDR 60-130. Patient AP@29 % last interrogation. Today device is in back up mode VVI 65. Patient states she is short of breath and tired all the time, but this has been going on prior to [facility] 7/6. She is advised to "listen to her body", rest when needed, and s/s of cardiac emergency as it relates to shortness of breath. Patient appreciative of call. States she is anxious about procedure. Emotional support and reassurance provided.

## 2020-10-30 NOTE — Telephone Encounter (Signed)
Confirmed with [employer] rep that patient's device is not reprogrammable from VVI to DDDR once it reaches ERI. Consulted A. Tillery, PA regarding appointment to discuss gen change. Patient has been scheduled 11/11/20@1220 . Per patient request detailed message left on patients home VM with appointment time and date.

## 2020-10-31 ENCOUNTER — Telehealth: Payer: Self-pay

## 2020-10-31 NOTE — Telephone Encounter (Signed)
The patient would like for Dr. Rayann Heman nurse to give her a call at [phone].

## 2020-10-31 NOTE — Telephone Encounter (Signed)
The patient wanted to make sure everything regarding ERI and device gen change can be set up with the PA since upcoming appt is with the PA and not MD. Advised everything can be set up and she will get procedure instructions along with procedure date the day of appt. Patient is anxious, gave reassurance everything is in order. Patient thankful for call back and verbalized understanding.

## 2020-11-01 ENCOUNTER — Telehealth: Payer: Self-pay

## 2020-11-01 NOTE — Telephone Encounter (Signed)
NOTES ON FILE FROM [facility] [phone] SENT REFERRAL TO SCHEDULING...Marland KitchenMarland Kitchen

## 2020-11-04 ENCOUNTER — Telehealth: Payer: Self-pay

## 2020-11-04 NOTE — Telephone Encounter (Signed)
Pt device has reached ERI.  She is scheduled to see Oda Kilts. PA on 11/11/20.  Pt concerned that her procedure would be delayed because she has not received most recent COVID booster.  Advised we do not delay procedures due to Covid Vaccine status.

## 2020-11-04 NOTE — Telephone Encounter (Signed)
The patient wanted to know since she reached RRT do she still need to send weekly remotes? I told her she no longer have to send weekly remotes since she already reached RRT. She is scheduled to discuss her gen change already. She also wanted to know if she should get the 2nd Booster and will it affect anything? I let her speak with Amy, rn.

## 2020-11-04 NOTE — Progress Notes (Signed)
Remote pacemaker transmission.   

## 2020-11-11 ENCOUNTER — Encounter: Payer: PPO | Admitting: Student

## 2020-11-12 NOTE — Progress Notes (Signed)
Remote pacemaker transmission.   

## 2020-11-24 NOTE — Progress Notes (Signed)
Electrophysiology Office Note Date: 11/26/2020  ID:  [name], DOB 25-Oct-1934, MRN [id number]  PCP: Leanna Battles, MD Primary Cardiologist: None Electrophysiologist: [name], MD   CC: Pacemaker follow-up  [name] is a 86 y.o. female seen today for [name], MD for acute visit due to device at [facility] .  Since last being seen in our clinic the patient reports doing OK. She has noticed a change in her functional capacity and more SOB since device has changed to VVI 65. Her device is NOT reprogrammable after ERI. Has noticed mild peripheral edema as well. No syncope or chest pain.  Device History: MDT dual chamber PPM, implanted 06/06/08 (Dr. Verlon Setting)  Past Medical History:  Diagnosis Date   Abnormal heart rhythm    Anemia    hx of not recent    Atypical chest pain    Cardiomegaly    Chronic headaches    Chronic kidney disease    Classic migraine with aura 12/17/2014   Depression    Dysrhythmia    HX OF MOBITZ 2 AV BLOCK AND FUNCTIONING PACEMAKER -DR. JAMES ALLRED FOLLOWS PT'S PACEMAKER CARE   GERD (gastroesophageal reflux disease)    Headache(784.0)    PT STATES HER CHRONIC H/A'S HAVE RESOLVED   Hypercholesteremia    Hyperlipidemia    Hypertension    Obstructive sleep apnea on CPAP    Osteoarthritis    Pacemaker    Recurrent upper respiratory infection (URI)    MARCH 2013 DX WITH BRONCHITIS--PT STATES RESOLVED--DOES HAVE SOME ALLERGIES AT PRESENT   Second degree Mobitz II AV block    s/p PPM by Dr Verlon Setting 2010   Sleep apnea    on CPAP-uses [facility]   Urinary incontinence    Past Surgical History:  Procedure Laterality Date   CARPAL TUNNEL RELEASE Bilateral    CATARACT EXTRACTION Left    CESAREAN SECTION     FOOT SURGERY     HAND SURGERY     KNEE CLOSED REDUCTION  11/30/2011   Procedure: CLOSED MANIPULATION KNEE;  Surgeon: Gearlean Alf, MD;  Location: [facility];  Service: Orthopedics;  Laterality: Left;   KNEE SURGERY     Arthroscopic surg    PACEMAKER INSERTION  2010   dual-chamber (MDT) implanted by Dr Verlon Setting   TOTAL KNEE ARTHROPLASTY Bilateral    right   TOTAL KNEE ARTHROPLASTY  08/24/2011   Procedure: TOTAL KNEE ARTHROPLASTY;  Surgeon: Gearlean Alf, MD;  Location: [facility];  Service: Orthopedics;  Laterality: Left;   TUBAL LIGATION  1968    Current Outpatient Medications  Medication Sig Dispense Refill   amLODipine (NORVASC) 5 MG tablet Take 5 mg by mouth at bedtime.     clindamycin (CLEOCIN) 300 MG capsule as needed. Dental appts     escitalopram (LEXAPRO) 10 MG tablet Take 10 mg by mouth daily.     furosemide (LASIX) 20 MG tablet Take 1 tablet (20 mg total) by mouth daily as needed. 30 tablet 6   metoprolol succinate (TOPROL-XL) 50 MG 24 hr tablet Take 50 mg by mouth daily.     Multiple Vitamins-Minerals (PRESERVISION AREDS 2) CAPS Take 1 capsule by mouth 2 (two) times daily.     Omega-3 Fatty Acids (FISH OIL) 1200 MG CAPS Take 1,200 mg by mouth every evening.     omeprazole (PRILOSEC) 20 MG capsule Take 20 mg by mouth every morning.     OVER THE COUNTER MEDICATION Take 1 tablet by mouth 2 (two)  times daily. Macular degeneration supplement (AREDS 2)     oxybutynin (DITROPAN) 5 MG tablet Take 1 tablet by mouth 2 (two) times daily.   0   propranolol ER (INDERAL LA) 160 MG SR capsule Take 1 capsule (160 mg total) by mouth daily. Please make overdue appt with Dr. Rayann Heman before anymore refills. Thank you 1st attempt 30 capsule 0   simvastatin (ZOCOR) 40 MG tablet Take 40 mg by mouth at bedtime.     telmisartan-hydrochlorothiazide (MICARDIS HCT) 80-12.5 MG per tablet Take 1 tablet by mouth daily with breakfast.     No current facility-administered medications for this visit.    Allergies:   Ace inhibitors, Amoxicillin, and Codeine   Social History: Social History   Socioeconomic History   Marital status: Married    Spouse name: [name]   Number of children: 3   Years of education: HS   Highest education  level: Not on file  Occupational History   Occupation: retired    Fish farm manager: RETIRED  Tobacco Use   Smoking status: Never   Smokeless tobacco: Never  Vaping Use   Vaping Use: Never used  Substance and Sexual Activity   Alcohol use: No   Drug use: No   Sexual activity: Never    Birth control/protection: Surgical  Other Topics Concern   Not on file  Social History Narrative   Patient drinks 1-2 cup of caffeine daily.   Patient is right handed.   Social Determinants of Health   Financial Resource Strain: Not on file  Food Insecurity: Not on file  Transportation Needs: Not on file  Physical Activity: Not on file  Stress: Not on file  Social Connections: Not on file  Intimate Partner Violence: Not on file    Family History: Family History  Problem Relation Age of Onset   Emphysema Father    Allergies Father    Heart disease Father    Hypertension Father    Heart attack Father    Macular degeneration Father    Heart disease Mother    Diabetes Mother    Hypertension Mother    Breast cancer Mother        Age 61   Osteoarthritis Mother    Dementia Mother    Macular degeneration Sister    Migraines Daughter      Review of Systems: All other systems reviewed and are otherwise negative except as noted above.  Physical Exam: Vitals:   11/26/20 0853  BP: 116/68  Pulse: 65  SpO2: 96%  Weight: 205 lb (93 kg)  Height: 5' (1.524 m)     GEN- The patient is well appearing, alert and oriented x 3 today.   HEENT: normocephalic, atraumatic; sclera clear, conjunctiva pink; hearing intact; oropharynx clear; neck supple  Lungs- Clear to ausculation bilaterally, normal work of breathing.  No wheezes, rales, rhonchi Heart- Regular rate and rhythm, no murmurs, rubs or gallops  GI- soft, non-tender, non-distended, bowel sounds present  Extremities- no clubbing or cyanosis. No edema MS- no significant deformity or atrophy Skin- warm and dry, no rash or lesion; PPM pocket well  healed Psych- euthymic mood, full affect Neuro- strength and sensation are intact  PPM Interrogation- reviewed in detail today,  See PACEART report  EKG:  EKG is ordered today. Personal review of ekg ordered today shows asynchronous v pacing at 65 bpm (at [facility])  Recent Labs: No results found for requested labs within last 8760 hours.   Wt Readings from Last 3  Encounters:  11/26/20 205 lb (93 kg)  06/14/20 214 lb (97.1 kg)  06/15/19 205 lb 12.8 oz (93.4 kg)     Other studies Reviewed: Additional studies/ records that were reviewed today include: Previous EP office notes, Previous remote checks, Most recent labwork.   Assessment and Plan:  1. Symptomatic bradycardia s/p Med[employer]onic PPM  Normal PPM function for device at ERI. Older device. No testing of atrial lead or episodes available. RV threshold stable. See Claudia Desanctis Art report Explained risks, benefits, and alternatives to PPM generator change, including but not limited to bleeding, infection, pneumothorax, pericardial effusion, or lead dislodgement. Pt verbalized understanding and agrees to proceed at next available time.  She does have mild peripheral edema and SOB in the setting of asynchronous pacing. Will give lasix to use as needed.  2. HTN Stable on current regimen  3. HLD Continue statin   4. PVCs.   Asymptomatic.  No burden available with device at [facility].  Follow       5. AFib SCAF previously noted.  CHA2DS2/VASc would be at least 4. Continue to follow burden s/p gen change.    Current medicines are reviewed at length with the patient today.   The patient does not have concerns regarding her medicines.  The following changes were made today:  Add lasix to use as needed with mild volume overload pending gen change.   Labs/ tests ordered today include:  Orders Placed This Encounter  Procedures   CBC   Basic metabolic panel   EKG [id number]   Disposition:   Follow up with Dr. Rayann Heman as usual post gen change.    Jacalyn Lefevre, PA-C  11/26/2020 9:23 AM  [facility] [address] [place] [address] [phone] (office) [phone] (fax)

## 2020-11-24 NOTE — H&P (View-Only) (Signed)
Electrophysiology Office Note Date: 11/26/2020  ID:  [name], DOB 1935-01-26, MRN [id number]  PCP: Tracey Battles, MD Primary Cardiologist: None Electrophysiologist: Tracey Grayer, MD   CC: Pacemaker follow-up  [name] is a 86 y.o. female seen today for Tracey Grayer, MD for acute visit due to device at [facility] .  Since last being seen in our clinic the patient reports doing OK. She has noticed a change in her functional capacity and more SOB since device has changed to VVI 65. Her device is NOT reprogrammable after ERI. Has noticed mild peripheral edema as well. No syncope or chest pain.  Device History: MDT dual chamber PPM, implanted 06/06/08 (Dr. Verlon Levine)  Past Medical History:  Diagnosis Date   Abnormal heart rhythm    Anemia    hx of not recent    Atypical chest pain    Cardiomegaly    Chronic headaches    Chronic kidney disease    Classic migraine with aura 12/17/2014   Depression    Dysrhythmia    HX OF MOBITZ 2 AV BLOCK AND FUNCTIONING PACEMAKER -DR. JAMES Levine FOLLOWS PT'S PACEMAKER CARE   GERD (gastroesophageal reflux disease)    Headache(784.0)    PT STATES HER CHRONIC H/A'S HAVE RESOLVED   Hypercholesteremia    Hyperlipidemia    Hypertension    Obstructive sleep apnea on CPAP    Osteoarthritis    Pacemaker    Recurrent upper respiratory infection (URI)    MARCH 2013 DX WITH BRONCHITIS--PT STATES RESOLVED--DOES HAVE SOME ALLERGIES AT PRESENT   Second degree Mobitz II AV block    s/p PPM by Dr [name] 2010   Sleep apnea    on CPAP-uses [facility]   Urinary incontinence    Past Surgical History:  Procedure Laterality Date   CARPAL TUNNEL RELEASE Bilateral    CATARACT EXTRACTION Left    CESAREAN SECTION     FOOT SURGERY     HAND SURGERY     KNEE CLOSED REDUCTION  11/30/2011   Procedure: CLOSED MANIPULATION KNEE;  Surgeon: Gearlean Alf, MD;  Location: [facility];  Service: Orthopedics;  Laterality: Left;   KNEE SURGERY     Arthroscopic surg    PACEMAKER INSERTION  2010   dual-chamber (MDT) implanted by Dr [name]   TOTAL KNEE ARTHROPLASTY Bilateral    right   TOTAL KNEE ARTHROPLASTY  08/24/2011   Procedure: TOTAL KNEE ARTHROPLASTY;  Surgeon: Gearlean Alf, MD;  Location: [facility];  Service: Orthopedics;  Laterality: Left;   TUBAL LIGATION  1968    Current Outpatient Medications  Medication Sig Dispense Refill   amLODipine (NORVASC) 5 MG tablet Take 5 mg by mouth at bedtime.     clindamycin (CLEOCIN) 300 MG capsule as needed. Dental appts     escitalopram (LEXAPRO) 10 MG tablet Take 10 mg by mouth daily.     furosemide (LASIX) 20 MG tablet Take 1 tablet (20 mg total) by mouth daily as needed. 30 tablet 6   metoprolol succinate (TOPROL-XL) 50 MG 24 hr tablet Take 50 mg by mouth daily.     Multiple Vitamins-Minerals (PRESERVISION AREDS 2) CAPS Take 1 capsule by mouth 2 (two) times daily.     Omega-3 Fatty Acids (FISH OIL) 1200 MG CAPS Take 1,200 mg by mouth every evening.     omeprazole (PRILOSEC) 20 MG capsule Take 20 mg by mouth every morning.     OVER THE COUNTER MEDICATION Take 1 tablet by mouth 2 (two)  times daily. Macular degeneration supplement (AREDS 2)     oxybutynin (DITROPAN) 5 MG tablet Take 1 tablet by mouth 2 (two) times daily.   0   propranolol ER (INDERAL LA) 160 MG SR capsule Take 1 capsule (160 mg total) by mouth daily. Please make overdue appt with Dr. Rayann Levine before anymore refills. Thank you 1st attempt 30 capsule 0   simvastatin (ZOCOR) 40 MG tablet Take 40 mg by mouth at bedtime.     telmisartan-hydrochlorothiazide (MICARDIS HCT) 80-12.5 MG per tablet Take 1 tablet by mouth daily with breakfast.     No current facility-administered medications for this visit.    Allergies:   Ace inhibitors, Amoxicillin, and Codeine   Social History: Social History   Socioeconomic History   Marital status: Married    Spouse name: [name] [place]   Number of children: 3   Years of education: HS   Highest education  level: Not on file  Occupational History   Occupation: retired    Fish farm manager: RETIRED  Tobacco Use   Smoking status: Never   Smokeless tobacco: Never  Vaping Use   Vaping Use: Never used  Substance and Sexual Activity   Alcohol use: No   Drug use: No   Sexual activity: Never    Birth control/protection: Surgical  Other Topics Concern   Not on file  Social History Narrative   Patient drinks 1-2 cup of caffeine daily.   Patient is right handed.   Social Determinants of Health   Financial Resource Strain: Not on file  Food Insecurity: Not on file  Transportation Needs: Not on file  Physical Activity: Not on file  Stress: Not on file  Social Connections: Not on file  Intimate Partner Violence: Not on file    Family History: Family History  Problem Relation Age of Onset   Emphysema Father    Allergies Father    Heart disease Father    Hypertension Father    Heart attack Father    Macular degeneration Father    Heart disease Mother    Diabetes Mother    Hypertension Mother    Breast cancer Mother        Age 30   Osteoarthritis Mother    Dementia Mother    Macular degeneration Sister    Migraines Daughter      Review of Systems: All other systems reviewed and are otherwise negative except as noted above.  Physical Exam: Vitals:   11/26/20 0853  BP: 116/68  Pulse: 65  SpO2: 96%  Weight: 205 lb (93 kg)  Height: 5' (1.524 m)     GEN- The patient is well appearing, alert and oriented x 3 today.   HEENT: normocephalic, atraumatic; sclera clear, conjunctiva pink; hearing intact; oropharynx clear; neck supple  Lungs- Clear to ausculation bilaterally, normal work of breathing.  No wheezes, rales, rhonchi Heart- Regular rate and rhythm, no murmurs, rubs or gallops  GI- soft, non-tender, non-distended, bowel sounds present  Extremities- no clubbing or cyanosis. No edema MS- no significant deformity or atrophy Skin- warm and dry, no rash or lesion; PPM pocket well  healed Psych- euthymic mood, full affect Neuro- strength and sensation are intact  PPM Interrogation- reviewed in detail today,  See PACEART report  EKG:  EKG is ordered today. Personal review of ekg ordered today shows asynchronous v pacing at 65 bpm (at [facility])  Recent Labs: No results found for requested labs within last 8760 hours.   Wt Readings from Last 3  Encounters:  11/26/20 205 lb (93 kg)  06/14/20 214 lb (97.1 kg)  06/15/19 205 lb 12.8 oz (93.4 kg)     Other studies Reviewed: Additional studies/ records that were reviewed today include: Previous EP office notes, Previous remote checks, Most recent labwork.   Assessment and Plan:  1. Symptomatic bradycardia s/p [employer] PPM  Normal PPM function for device at ERI. Older device. No testing of atrial lead or episodes available. RV threshold stable. See Claudia Desanctis Art report Explained risks, benefits, and alternatives to PPM generator change, including but not limited to bleeding, infection, pneumothorax, pericardial effusion, or lead dislodgement. Pt verbalized understanding and agrees to proceed at next available time.  She does have mild peripheral edema and SOB in the Levine of asynchronous pacing. Will give lasix to use as needed.  2. HTN Stable on current regimen  3. HLD Continue statin   4. PVCs.   Asymptomatic.  No burden available with device at [facility].  Follow       5. AFib SCAF previously noted.  CHA2DS2/VASc would be at least 4. Continue to follow burden s/p gen change.    Current medicines are reviewed at length with the patient today.   The patient does not have concerns regarding her medicines.  The following changes were made today:  Add lasix to use as needed with mild volume overload pending gen change.   Labs/ tests ordered today include:  Orders Placed This Encounter  Procedures   CBC   Basic metabolic panel   EKG [id number]   Disposition:   Follow up with Dr. Rayann Levine as usual post gen change.    Jacalyn Lefevre, PA-C  11/26/2020 9:23 AM  [facility] [address] [place] [address] [phone] (office) [phone] (fax)

## 2020-11-26 ENCOUNTER — Encounter (INDEPENDENT_AMBULATORY_CARE_PROVIDER_SITE_OTHER): Payer: Self-pay

## 2020-11-26 ENCOUNTER — Ambulatory Visit: Payer: PPO | Admitting: Student

## 2020-11-26 ENCOUNTER — Encounter: Payer: Self-pay | Admitting: Student

## 2020-11-26 ENCOUNTER — Other Ambulatory Visit: Payer: Self-pay

## 2020-11-26 VITALS — BP 116/68 | HR 65 | Ht 60.0 in | Wt 205.0 lb

## 2020-11-26 DIAGNOSIS — Z950 Presence of cardiac pacemaker: Secondary | ICD-10-CM | POA: Diagnosis not present

## 2020-11-26 DIAGNOSIS — R609 Edema, unspecified: Secondary | ICD-10-CM | POA: Diagnosis not present

## 2020-11-26 DIAGNOSIS — I10 Essential (primary) hypertension: Secondary | ICD-10-CM

## 2020-11-26 DIAGNOSIS — I480 Paroxysmal atrial fibrillation: Secondary | ICD-10-CM

## 2020-11-26 DIAGNOSIS — I493 Ventricular premature depolarization: Secondary | ICD-10-CM | POA: Diagnosis not present

## 2020-11-26 DIAGNOSIS — I441 Atrioventricular block, second degree: Secondary | ICD-10-CM | POA: Diagnosis not present

## 2020-11-26 LAB — CUP PACEART INCLINIC DEVICE CHECK
Battery Impedance: 8814 Ohm
Battery Voltage: 2.58 V
Brady Statistic RV Percent Paced: 98 %
Date Time Interrogation Session: 20220809092645
Implantable Lead Implant Date: 20100217
Implantable Lead Implant Date: 20100217
Implantable Lead Location: 753859
Implantable Lead Location: 753860
Implantable Lead Model: 5076
Implantable Lead Model: 5076
Implantable Pulse Generator Implant Date: 20100217
Lead Channel Impedance Value: 549 Ohm
Lead Channel Impedance Value: 67 Ohm
Lead Channel Setting Pacing Amplitude: 2.5 V
Lead Channel Setting Pacing Pulse Width: 0.4 ms
Lead Channel Setting Sensing Sensitivity: 2.8 mV

## 2020-11-26 LAB — BASIC METABOLIC PANEL
BUN/Creatinine Ratio: 24 (ref 12–28)
BUN: 31 mg/dL — ABNORMAL HIGH (ref 8–27)
CO2: 19 mmol/L — ABNORMAL LOW (ref 20–29)
Calcium: 9.5 mg/dL (ref 8.7–10.3)
Chloride: 104 mmol/L (ref 96–106)
Creatinine, Ser: 1.28 mg/dL — ABNORMAL HIGH (ref 0.57–1.00)
Glucose: 86 mg/dL (ref 65–99)
Potassium: 4.5 mmol/L (ref 3.5–5.2)
Sodium: 140 mmol/L (ref 134–144)
eGFR: 41 mL/min/{1.73_m2} — ABNORMAL LOW (ref >59)

## 2020-11-26 LAB — CBC
Hematocrit: 42.9 % (ref 34.0–46.6)
Hemoglobin: 14.2 g/dL (ref 11.1–15.9)
MCH: 28.7 pg (ref 26.6–33.0)
MCHC: 33.1 g/dL (ref 31.5–35.7)
MCV: 87 fL (ref 79–97)
Platelets: 250 10*3/uL (ref 150–450)
RBC: 4.94 x10E6/uL (ref 3.77–5.28)
RDW: 13 % (ref 11.7–15.4)
WBC: 7.2 10*3/uL (ref 3.4–10.8)

## 2020-11-26 MED ORDER — FUROSEMIDE 20 MG PO TABS: 20.0000 mg | ORAL_TABLET | Freq: Every day | ORAL | 6 refills | Status: DC | PRN

## 2020-11-26 NOTE — Patient Instructions (Addendum)
Medication Instructions: Your physician has recommended you make the following change in your medication:   START: Furosemide '20mg'$  as needed  Labwork: Your physician has recommended that you have lab work today: BMET and CBC   Procedures/Testing: Your physician has recommended that you have a Generator Change of your device. This is a procedure that replaces a Pacemaker ICD generator that is at the end of its service life. The remaining lifespan of a pacemaker is determined during visits to the [facility]. The battery in a pacemaker does not stop suddenly but rather loses its charge slowly, which lets the cardiologist plan the replacement date.  Follow-Up: Your physician recommends that you schedule a follow-up appointment in 10 - 14 days from 12/03/2020 with the Device clinic for a wound check  Your physician recommends that you schedule a follow-up appointment in 3 months from 12/03/2020 with Dr. Rayann Heman.   If you need a refill on your cardiac medications before your next appointment, please call your pharmacy.   -------------------------------------------------------------------------------------------------------------  Please wash with the CHG Soap the night before and morning of procedure (follow instruction page "Preparing For Surgery").   Please report to the [place] [facility] at 11:30am [facility] 12[facility]03/2020. [facility] [id number]  DO NOT eat or drink anything after midnight the night before procedure  You may take your morning medications with a small sip of water.   You will need someone to drive you home after the procedure  -------------------------------------------------------------------------------------------------------------  [facility] - Preparing For Surgery  Before surgery, you can play an important role. Because skin is not sterile, your skin needs to be as free of germs as possible. You can reduce  the number of germs on your skin by washing with CHG (chlorahexidine gluconate) Soap before surgery.  CHG is an antiseptic cleaner which kills germs and bonds with the skin to continue killing germs even after washing.   Please do not use if you have an allergy to CHG or antibacterial soaps.  If your skin becomes reddened/irritated stop using the CHG.   Do not shave (including legs and underarms) for at least 48 hours prior to first CHG shower.  It is OK to shave your face.  Please follow these instructions carefully:  1.  Shower the night before surgery and the morning of surgery with CHG.  2.  If you choose to wash your hair, wash your hair first as usual with your normal shampoo.  3.  After you shampoo, rinse your hair and body thoroughly to remove the shampoo.  4.  Use CHG as you would any other liquid soap.  You can apply CHG directly to the skin and wash gently with a clean washcloth. 5.  Apply the CHG Soap to your body ONLY FROM THE NECK DOWN.  Do not use on open wounds or open sores.  Avoid contact with your eyes, ears, mouth and genitals (private parts).  Wash genitals (private parts) with your normal soap.  6.  Wash thoroughly, paying special attention to the area where your surgery will be performed.  7.  Thoroughly rinse your body with warm water from the neck down.   8.  DO NOT shower/wash with your normal soap after using and rinsing off the CHG soap.  9.  Pat yourself dry with a clean towel.           10.  Wear clean pajamas.  11.  Place clean sheets on your bed the night of your first shower and do not sleep with pets.  Day of Surgery: Do not apply any deodorants/lotions.  Please wear clean clothes to the hospital/surgery center.

## 2020-11-27 ENCOUNTER — Telehealth: Payer: Self-pay | Admitting: Internal Medicine

## 2020-11-27 NOTE — Telephone Encounter (Signed)
Removed Propranolol from patients med list, removed duplicate. Discussed Furosemide and S&S to determine if she need to take it PRN.  Verbalized understanding

## 2020-11-27 NOTE — Telephone Encounter (Signed)
Pt c/o medication issue:  1. Name of Medication:  propranolol ER (INDERAL LA) 160 MG SR capsule  furosemide (LASIX) 20 MG tablet  2. How are you currently taking this medication (dosage and times per day)? Does not take propranolol, needs instructions for taking the lasix  3. Are you having a reaction (difficulty breathing--STAT)? no  4. What is your medication issue? Patient states she is not taking the propranolol any more and it needs to be removed from her medication list. She also states the Over the counter medication listed is a duplicate for Preservision. She would also like a call back to give her the instructions for taking the furosemide. She says she is not sure if she is supposed to start it now and if she takes it everyday. She also states she would like to know how long her battery replacement procedure will take.

## 2020-11-28 ENCOUNTER — Telehealth: Payer: Self-pay | Admitting: *Deleted

## 2020-11-28 NOTE — Telephone Encounter (Signed)
Advised new arrival time for procedure is now Aug 16 at 1:30 pm. Verbalized understanding.

## 2020-12-02 NOTE — Pre-Procedure Instructions (Signed)
Instructed patient on the following items: Arrival time 1:30pm Have a light breakfast, finish by 7:30 nothing to eat or drink after that No meds AM of procedure Responsible person to drive you home and stay with you for 24 hrs Wash with special soap night before and morning of procedure

## 2020-12-03 ENCOUNTER — Other Ambulatory Visit: Payer: Self-pay

## 2020-12-03 ENCOUNTER — Ambulatory Visit (HOSPITAL_COMMUNITY)
Admission: RE | Admit: 2020-12-03 | Discharge: 2020-12-03 | Disposition: A | Discharge status: Home / Self Care | Payer: PPO | Attending: Internal Medicine | Admitting: Internal Medicine

## 2020-12-03 ENCOUNTER — Encounter (HOSPITAL_COMMUNITY)
Admission: RE | Disposition: A | Discharge status: Home / Self Care | Payer: Self-pay | Source: Home / Self Care | Attending: Internal Medicine

## 2020-12-03 DIAGNOSIS — Z45010 Encounter for checking and testing of cardiac pacemaker pulse generator [battery]: Secondary | ICD-10-CM | POA: Diagnosis not present

## 2020-12-03 DIAGNOSIS — N189 Chronic kidney disease, unspecified: Secondary | ICD-10-CM | POA: Diagnosis not present

## 2020-12-03 DIAGNOSIS — Z79899 Other long term (current) drug therapy: Secondary | ICD-10-CM | POA: Insufficient documentation

## 2020-12-03 DIAGNOSIS — E7800 Pure hypercholesterolemia, unspecified: Secondary | ICD-10-CM | POA: Insufficient documentation

## 2020-12-03 DIAGNOSIS — Z880 Allergy status to penicillin: Secondary | ICD-10-CM | POA: Insufficient documentation

## 2020-12-03 DIAGNOSIS — R001 Bradycardia, unspecified: Secondary | ICD-10-CM | POA: Diagnosis not present

## 2020-12-03 DIAGNOSIS — I442 Atrioventricular block, complete: Secondary | ICD-10-CM | POA: Insufficient documentation

## 2020-12-03 DIAGNOSIS — Z885 Allergy status to narcotic agent status: Secondary | ICD-10-CM | POA: Insufficient documentation

## 2020-12-03 DIAGNOSIS — I4891 Unspecified atrial fibrillation: Secondary | ICD-10-CM | POA: Insufficient documentation

## 2020-12-03 DIAGNOSIS — Z888 Allergy status to other drugs, medicaments and biological substances status: Secondary | ICD-10-CM | POA: Diagnosis not present

## 2020-12-03 DIAGNOSIS — I129 Hypertensive chronic kidney disease with stage 1 through stage 4 chronic kidney disease, or unspecified chronic kidney disease: Secondary | ICD-10-CM | POA: Diagnosis not present

## 2020-12-03 HISTORY — PX: PPM GENERATOR CHANGEOUT: EP1233

## 2020-12-03 SURGERY — PPM GENERATOR CHANGEOUT

## 2020-12-03 MED ORDER — SODIUM CHLORIDE 0.9 % IV SOLN
80.0000 mg | INTRAVENOUS | Status: AC
  Administered 2020-12-03: 80 mg
  Filled 2020-12-03: qty 80

## 2020-12-03 MED ORDER — SODIUM CHLORIDE 0.9% FLUSH: 3.0000 mL | Freq: Two times a day (BID) | INTRAVENOUS | Status: DC

## 2020-12-03 MED ORDER — ACETAMINOPHEN 325 MG PO TABS: 325.0000 mg | ORAL_TABLET | ORAL | Status: DC | PRN

## 2020-12-03 MED ORDER — MIDAZOLAM HCL 5 MG/5ML IJ SOLN
INTRAMUSCULAR | Status: DC | PRN
  Administered 2020-12-03: 1 mg via INTRAVENOUS

## 2020-12-03 MED ORDER — SODIUM CHLORIDE 0.9% FLUSH: 3.0000 mL | INTRAVENOUS | Status: DC | PRN

## 2020-12-03 MED ORDER — VANCOMYCIN HCL IN DEXTROSE 1-5 GM/200ML-% IV SOLN
INTRAVENOUS | Status: AC
  Filled 2020-12-03: qty 200

## 2020-12-03 MED ORDER — SODIUM CHLORIDE 0.9 % IV SOLN: 250.0000 mL | INTRAVENOUS | Status: DC | PRN

## 2020-12-03 MED ORDER — MIDAZOLAM HCL 5 MG/5ML IJ SOLN
INTRAMUSCULAR | Status: AC
  Filled 2020-12-03: qty 5

## 2020-12-03 MED ORDER — ONDANSETRON HCL 4 MG/2ML IJ SOLN: 4.0000 mg | Freq: Four times a day (QID) | INTRAMUSCULAR | Status: DC | PRN

## 2020-12-03 MED ORDER — LIDOCAINE HCL (PF) 1 % IJ SOLN
INTRAMUSCULAR | Status: DC | PRN
  Administered 2020-12-03: 60 mL

## 2020-12-03 MED ORDER — SODIUM CHLORIDE 0.9 % IV SOLN: INTRAVENOUS | Status: DC

## 2020-12-03 MED ORDER — VANCOMYCIN HCL IN DEXTROSE 1-5 GM/200ML-% IV SOLN
1000.0000 mg | INTRAVENOUS | Status: AC
  Administered 2020-12-03: 1000 mg via INTRAVENOUS

## 2020-12-03 MED ORDER — POVIDONE-IODINE 10 % EX SWAB: 2.0000 "application " | Freq: Once | CUTANEOUS | Status: DC

## 2020-12-03 MED ORDER — CHLORHEXIDINE GLUCONATE 4 % EX LIQD: 4.0000 "application " | Freq: Once | CUTANEOUS | Status: DC

## 2020-12-03 SURGICAL SUPPLY — 5 items
CABLE SURGICAL S-101-97-12 (CABLE) ×2 IMPLANT
IPG PACE AZUR XT DR MRI W1DR01 (Pacemaker) IMPLANT
PACE AZURE XT DR MRI W1DR01 (Pacemaker) ×2 IMPLANT
PAD PRO RADIOLUCENT 2001M-C (PAD) ×2 IMPLANT
TRAY PACEMAKER INSERTION (PACKS) ×2 IMPLANT

## 2020-12-03 NOTE — Interval H&P Note (Signed)
History and Physical Interval Note:  12/03/2020 1:41 PM  [name]  has presented today for surgery, with the diagnosis of eri.  The various methods of treatment have been discussed with the patient and family. After consideration of risks, benefits and other options for treatment, the patient has consented to  Procedure(s): PPM GENERATOR CHANGEOUT (N/A) as a surgical intervention.  The patient's history has been reviewed, patient examined, no change in status, stable for surgery.  I have reviewed the patient's chart and labs.  Questions were answered to the patient's satisfaction.     Risks, benefits, and alternatives to PPPM pulse generator replacement were discussed in detail today.  The patient understands that risks include but are not limited to bleeding, infection, pneumothorax, perforation, tamponade, vascular damage, renal failure, MI, stroke, death,  damage to his existing leads, and lead dislodgement and wishes to proceed.     [name]

## 2020-12-03 NOTE — Interval H&P Note (Signed)
History and Physical Interval Note:  12/03/2020 1:42 PM  [name]  has presented today for surgery, with the diagnosis of eri.  The various methods of treatment have been discussed with the patient and family. After consideration of risks, benefits and other options for treatment, the patient has consented to  Procedure(s): PPM GENERATOR CHANGEOUT (N/A) as a surgical intervention.  The patient's history has been reviewed, patient examined, no change in status, stable for surgery.  I have reviewed the patient's chart and labs.  Questions were answered to the patient's satisfaction.     [name]

## 2020-12-04 ENCOUNTER — Encounter (HOSPITAL_COMMUNITY): Payer: Self-pay | Admitting: Internal Medicine

## 2020-12-05 ENCOUNTER — Telehealth: Payer: Self-pay | Admitting: Internal Medicine

## 2020-12-05 NOTE — Telephone Encounter (Signed)
Returned call to Pt.  Advised [name] works very well, or a benadryl cream.  Advised to avoid the steristrips.  She is aware.

## 2020-12-05 NOTE — Telephone Encounter (Signed)
Patient called and said she had some red welts that the tape from her initial bandage was . She wanted to know if there was anything she could put on the site because it itches.

## 2020-12-10 ENCOUNTER — Telehealth: Payer: Self-pay | Admitting: Internal Medicine

## 2020-12-10 NOTE — Telephone Encounter (Signed)
Spoke with pt.  She received an ipone when her gen Tonita Cong was done, but does not know what do with it.  Attempted to assist pt with turning on phone.  She reports she is not very tech savy and had difficulty figuring it out.  Advised pt to bring the phone and box it came in with ID card to her wound check appt and we will try to hep her with it.

## 2020-12-10 NOTE — Telephone Encounter (Signed)
Patient states she was given a phone after her procedure by a man in the hospital. She is not sure what he said about what to do with the phone and is not sure who she needs to speak with.

## 2020-12-18 ENCOUNTER — Other Ambulatory Visit: Payer: Self-pay

## 2020-12-18 ENCOUNTER — Ambulatory Visit (INDEPENDENT_AMBULATORY_CARE_PROVIDER_SITE_OTHER): Payer: PPO

## 2020-12-18 DIAGNOSIS — I441 Atrioventricular block, second degree: Secondary | ICD-10-CM

## 2020-12-18 LAB — CUP PACEART INCLINIC DEVICE CHECK
Battery Remaining Longevity: 135 mo
Battery Voltage: 3.22 V
Brady Statistic AP VP Percent: 18.07 %
Brady Statistic AP VS Percent: 0.05 %
Brady Statistic AS VP Percent: 80.05 %
Brady Statistic AS VS Percent: 1.82 %
Brady Statistic RA Percent Paced: 18.34 %
Brady Statistic RV Percent Paced: 98.12 %
Date Time Interrogation Session: 20220831112514
Implantable Lead Implant Date: 20100217
Implantable Lead Implant Date: 20100217
Implantable Lead Location: 753859
Implantable Lead Location: 753860
Implantable Lead Model: 5076
Implantable Lead Model: 5076
Implantable Pulse Generator Implant Date: 20220816
Lead Channel Impedance Value: 361 Ohm
Lead Channel Impedance Value: 399 Ohm
Lead Channel Impedance Value: 418 Ohm
Lead Channel Impedance Value: 475 Ohm
Lead Channel Pacing Threshold Amplitude: 0.5 V
Lead Channel Pacing Threshold Amplitude: 0.75 V
Lead Channel Pacing Threshold Pulse Width: 0.4 ms
Lead Channel Pacing Threshold Pulse Width: 0.4 ms
Lead Channel Sensing Intrinsic Amplitude: 1.5 mV
Lead Channel Setting Pacing Amplitude: 1.75 V
Lead Channel Setting Pacing Amplitude: 2.5 V
Lead Channel Setting Pacing Pulse Width: 0.4 ms
Lead Channel Setting Sensing Sensitivity: 4 mV

## 2020-12-18 NOTE — Progress Notes (Signed)
Wound check appointment. Steri-strips removed. Wound without redness or edema. Incision edges approximated, wound well healed. Normal device function. Thresholds, sensing, and impedances consistent with implant measurements. Device programmed at chronic lead settings. Histogram distribution appropriate for patient and level of activity. AT/AF burden <0.1%, 12 AT events, longest 6 minutes in duration. No high ventricular rates noted. Patient educated about wound care, arm mobility, lifting restrictions. ROV in 3 months with Dr. Rayann Heman.

## 2020-12-18 NOTE — Patient Instructions (Signed)
   After Your Pacemaker   Monitor your pacemaker site for redness, swelling, and drainage. Call the device clinic at [phone] if you experience these symptoms or fever/chills.  Your incision was closed with Steri-strips or staples:  You may shower 7 days after your procedure and wash your incision with soap and water. Avoid lotions, ointments, or perfumes over your incision until it is well-healed.  You may use a hot tub or a pool after your wound check appointment if the incision is completely closed.  You may drive, unless driving has been restricted by your healthcare providers.  Your Pacemaker is MRI compatible.  Remote monitoring is used to monitor your pacemaker from home. This monitoring is scheduled every 91 days by our office. It allows us to keep an eye on the functioning of your device to ensure it is working properly. You will routinely see your Electrophysiologist annually (more often if necessary).  

## 2020-12-26 ENCOUNTER — Telehealth: Payer: Self-pay | Admitting: Cardiology

## 2020-12-26 NOTE — Telephone Encounter (Signed)
[name] is returning [name]'s call needing to know what medical records [name] was calling in for in regards to needing them for the patient appointment on 12/30/20.

## 2020-12-26 NOTE — Telephone Encounter (Signed)
Returned call and left vm explaining records needed for pt.

## 2020-12-30 ENCOUNTER — Other Ambulatory Visit: Payer: Self-pay

## 2020-12-30 ENCOUNTER — Encounter (HOSPITAL_BASED_OUTPATIENT_CLINIC_OR_DEPARTMENT_OTHER): Payer: Self-pay | Admitting: Cardiology

## 2020-12-30 ENCOUNTER — Ambulatory Visit (HOSPITAL_BASED_OUTPATIENT_CLINIC_OR_DEPARTMENT_OTHER): Payer: PPO | Admitting: Cardiology

## 2020-12-30 VITALS — BP 116/68 | HR 80 | Ht 60.0 in | Wt 205.3 lb

## 2020-12-30 DIAGNOSIS — Z950 Presence of cardiac pacemaker: Secondary | ICD-10-CM | POA: Diagnosis not present

## 2020-12-30 DIAGNOSIS — R0602 Shortness of breath: Secondary | ICD-10-CM | POA: Diagnosis not present

## 2020-12-30 DIAGNOSIS — I10 Essential (primary) hypertension: Secondary | ICD-10-CM

## 2020-12-30 DIAGNOSIS — I441 Atrioventricular block, second degree: Secondary | ICD-10-CM

## 2020-12-30 DIAGNOSIS — I493 Ventricular premature depolarization: Secondary | ICD-10-CM | POA: Diagnosis not present

## 2020-12-30 DIAGNOSIS — R609 Edema, unspecified: Secondary | ICD-10-CM | POA: Diagnosis not present

## 2020-12-30 NOTE — Patient Instructions (Signed)
Medication Instructions:  Your physician recommends that you continue on your current medications as directed. Please refer to the Current Medication list given to you today.   *If you need a refill on your cardiac medications before your next appointment, please call your pharmacy*  Lab Work: NONE   Testing/Procedures: Your physician has requested that you have an echocardiogram. Echocardiography is a painless test that uses sound waves to create images of your heart. It provides your doctor with information about the size and shape of your heart and how well your heart's chambers and valves are working. This procedure takes approximately one hour. There are no restrictions for this procedure.    Follow-Up: At [facility], you and your health needs are our priority.  As part of our continuing mission to provide you with exceptional heart care, we have created designated Provider Care Teams.  These Care Teams include your primary Cardiologist (physician) and Advanced Practice Providers (APPs -  Physician Assistants and Nurse Practitioners) who all work together to provide you with the care you need, when you need it.  We recommend signing up for the patient portal called "MyChart".  Sign up information is provided on this After Visit Summary.  MyChart is used to connect with patients for Virtual Visits (Telemedicine).  Patients are able to view lab/test results, encounter notes, upcoming appointments, etc.  Non-urgent messages can be sent to your provider as well.   To learn more about what you can do with MyChart, go to [email or web address].    Your next appointment:   12 month(s)  The format for your next appointment:   In Person  Provider:   Buford Dresser, MD

## 2020-12-30 NOTE — Progress Notes (Signed)
Cardiology Office Note:    Date:  12/30/2020   ID:  AMESHIA PETROSIAN, DOB [birth year], MRN [id number]  PCP:  Leanna Battles, MD  Cardiologist:  Buford Dresser, MD EP: Dr. Rayann Heman  Referring MD: Leanna Battles, MD   CC: new patient evaluation for shortness of breath  History of Present Illness:    [name] is a 86 y.o. female with a hx of hypertension, hyperlipidemia, paroxysmal atrial fibrillation, OSA, Second degree type II AV block s/p PPM who is seen as a new consult at the request of Leanna Battles, MD for the evaluation and management of shortness of breath.  She has been seen by EP but has not been followed by general cardiology since 2013 (Dr. Mare Ferrari). Last seen in EP clinic 11/26/20, noted ERI on device with asynchronous pacing at 65 bpm, s/p gen change 12/03/20 by Dr. Rayann Heman. Noted at EP clinic to have more shortness of breath and peripheral edema with asynchronous pacing.  Note from 10/29/20 From Hope Budds, [place] reviewed. Concern was for worsening shortness of breath and fatigue.  Cardiovascular risk factors: Prior clinical ASCVD: None Comorbid conditions: hypertension, hyperlipidemia Tobacco use history: Never smoker Family history: Father died at 75 yo, and had multiple heart attacks. Exercise level: This summer, she could only walk 10 feet before needing to rest. She has been resting and recovering since her generator change, and has not participated in much formal exercise. She is limited by her baseline shortness of breath and hip pain. She does walk her dog regularly, which includes walking down the driveway and around the court. In her home she has one step down to the laundry room, and uses a handle to steady herself. Current diet: Previously using fish oil supplement.  Her breathing improved significantly almost immediately after her generator change. She still has some shortness of breath that has been about the same since before her  procedure, including this morning as she was hurrying.  In 03/2020 she had pneumonia. Since then she has had an intermittent cough, worse when she goes to bed at night.   Since her appointment with Dr. Chalmers Cater she has been taking Lasix, which has improved her LE edema. At this time she is taking Lasix every other day.  She is unaware of having any episodes or history of atrial fibrillation.  For back pain management, she has been using Aleve every 2-3 days. Currently she takes Omeprazole for heartburn. Also, since beginning metoprolol she is experiencing tremors.  She denies any palpitations, or chest pain. No lightheadedness, headaches, syncope, orthopnea, or PND.    Past Medical History:  Diagnosis Date   Abnormal heart rhythm    Anemia    hx of not recent    Atypical chest pain    Cardiomegaly    Chronic headaches    Chronic kidney disease    Classic migraine with aura 12/17/2014   Depression    Dysrhythmia    HX OF MOBITZ 2 AV BLOCK AND FUNCTIONING PACEMAKER -DR. JAMES ALLRED FOLLOWS PT'S PACEMAKER CARE   GERD (gastroesophageal reflux disease)    Headache(784.0)    PT STATES HER CHRONIC H/A'S HAVE RESOLVED   Hypercholesteremia    Hyperlipidemia    Hypertension    Obstructive sleep apnea on CPAP    Osteoarthritis    Pacemaker    Recurrent upper respiratory infection (URI)    MARCH 2013 DX WITH BRONCHITIS--PT STATES RESOLVED--DOES HAVE SOME ALLERGIES AT PRESENT   Second degree Mobitz II AV block  s/p PPM by Dr Verlon Setting 2010   Sleep apnea    on CPAP-uses [facility]   Urinary incontinence     Past Surgical History:  Procedure Laterality Date   CARPAL TUNNEL RELEASE Bilateral    CATARACT EXTRACTION Left    CESAREAN SECTION     FOOT SURGERY     HAND SURGERY     KNEE CLOSED REDUCTION  11/30/2011   Procedure: CLOSED MANIPULATION KNEE;  Surgeon: Gearlean Alf, MD;  Location: [facility];  Service: Orthopedics;  Laterality: Left;   KNEE SURGERY     Arthroscopic surg   PACEMAKER  INSERTION  2010   dual-chamber (MDT) implanted by Dr Verlon Setting   [facility] GENERATOR CHANGEOUT N/A 12/03/2020   Procedure: PPM GENERATOR CHANGEOUT;  Surgeon: [name], MD;  Location: [place] [facility];  Service: Cardiovascular;  Laterality: N/A;   TOTAL KNEE ARTHROPLASTY Bilateral    right   TOTAL KNEE ARTHROPLASTY  08/24/2011   Procedure: TOTAL KNEE ARTHROPLASTY;  Surgeon: Gearlean Alf, MD;  Location: [facility];  Service: Orthopedics;  Laterality: Left;   TUBAL LIGATION  1968    Current Medications: Current Outpatient Medications on File Prior to Visit  Medication Sig   amLODipine (NORVASC) 5 MG tablet Take 5 mg by mouth at bedtime.   clindamycin (CLEOCIN) 300 MG capsule Take 600 mg by mouth See admin instructions. Take 600 mg before dental work then 600 mg after the dental work   escitalopram (LEXAPRO) 10 MG tablet Take 10 mg by mouth daily.   furosemide (LASIX) 20 MG tablet Take 1 tablet (20 mg total) by mouth daily as needed.   metoprolol succinate (TOPROL-XL) 50 MG 24 hr tablet Take 50 mg by mouth daily.   Multiple Vitamins-Minerals (PRESERVISION AREDS 2) CAPS Take 1 capsule by mouth 2 (two) times daily.   omeprazole (PRILOSEC) 20 MG capsule Take 20 mg by mouth every morning.   oxybutynin (DITROPAN) 5 MG tablet Take 1 tablet by mouth 2 (two) times daily.    simvastatin (ZOCOR) 40 MG tablet Take 40 mg by mouth at bedtime.   telmisartan-hydrochlorothiazide (MICARDIS HCT) 80-12.5 MG per tablet Take 1 tablet by mouth daily with breakfast.   Omega-3 Fatty Acids (FISH OIL) 1200 MG CAPS Take 1,200 mg by mouth every evening. (Patient not taking: Reported on 12/30/2020)   No current facility-administered medications on file prior to visit.     Allergies:   Ace inhibitors, Amoxicillin, and Codeine   Social History   Tobacco Use   Smoking status: Never   Smokeless tobacco: Never  Vaping Use   Vaping Use: Never used  Substance Use Topics   Alcohol use: No   Drug use: No    Family  History: family history includes Allergies in her father; Breast cancer in her mother; Dementia in her mother; Diabetes in her mother; Emphysema in her father; Heart attack in her father; Heart disease in her father and mother; Hypertension in her father and mother; Macular degeneration in her father and sister; Migraines in her daughter; Osteoarthritis in her mother.  ROS:   Please see the history of present illness.  Additional pertinent ROS: Constitutional: Negative for chills, fever, night sweats, unintentional weight loss  HENT: Negative for ear pain and hearing loss.   Eyes: Negative for loss of vision and eye pain.  Respiratory: Positive for Shortness of breath, cough. Negative for sputum, wheezing.   Cardiovascular: See HPI. Gastrointestinal: Negative for abdominal pain, melena, and hematochezia.  Genitourinary: Negative for dysuria  and hematuria.  Musculoskeletal: Positive for hip and back pain. Negative for falls and myalgias.  Skin: Negative for itching and rash.  Neurological: Negative for focal weakness, focal sensory changes and loss of consciousness.  Endo/Heme/Allergies: Does not bruise/bleed easily.     EKGs/Labs/Other Studies Reviewed:    The following studies were reviewed today:  Echo 12/29/2018: 1. The left ventricle has hyperdynamic systolic function, with an  ejection fraction of >65%. The cavity size was normal. Left ventricular  diastolic Doppler parameters are consistent with impaired relaxation.   2. The right ventricle has normal systolic function. The cavity was  normal. There is no increase in right ventricular wall thickness.   3. Left atrial size was mildly dilated.   4. The mitral valve is abnormal. Mild thickening of the mitral valve  leaflet. There is mild to moderate mitral annular calcification present.   5. Tricuspid valve regurgitation is mild-moderate.   6. The aortic valve is tricuspid. Mild thickening of the aortic valve.  Mild calcification  of the aortic valve. Aortic valve regurgitation is mild  by color flow Doppler.   EKG:  EKG is personally reviewed.   12/30/2020: AV dual paced rhythm at 80 bpm  Recent Labs: 11/26/2020: BUN 31; Creatinine, Ser 1.28; Hemoglobin 14.2; Platelets 250; Potassium 4.5; Sodium 140  Recent Lipid Panel    Component Value Date/Time   CHOL  06/04/2008 1430    171        ATP III CLASSIFICATION:  <200     mg/dL   Desirable  200-239  mg/dL   Borderline High  >=240    mg/dL   High          TRIG 144 06/04/2008 1430   HDL 47 06/04/2008 1430   CHOLHDL 3.6 06/04/2008 1430   VLDL 29 06/04/2008 1430   LDLCALC  06/04/2008 1430    95        Total Cholesterol/HDL:CHD Risk Coronary Heart Disease Risk Table                     Men   Women  1/2 Average Risk   3.4   3.3  Average Risk       5.0   4.4  2 X Average Risk   9.6   7.1  3 X Average Risk  23.4   11.0        Use the calculated Patient Ratio above and the CHD Risk Table to determine the patient's CHD Risk.        ATP III CLASSIFICATION (LDL):  <100     mg/dL   Optimal  100-129  mg/dL   Near or Above                    Optimal  130-159  mg/dL   Borderline  160-189  mg/dL   High  >190     mg/dL   Very High    Physical Exam:    VS:  BP 116/68   Pulse 80   Ht 5' (1.524 m)   Wt 205 lb 4.8 oz (93.1 kg)   SpO2 96%   BMI 40.09 kg/m     Wt Readings from Last 3 Encounters:  12/30/20 205 lb 4.8 oz (93.1 kg)  12/03/20 204 lb (92.5 kg)  11/26/20 205 lb (93 kg)    GEN: Well nourished, well developed in no acute distress HEENT: Normal, moist mucous membranes NECK: No JVD CARDIAC: regular rhythm, normal S1 and  S2, no rubs or gallops. No murmur. VASCULAR: Radial and DP pulses 2+ bilaterally. No carotid bruits RESPIRATORY:  Coarse sounds in upper right lung without rales, wheezing or rhonchi  ABDOMEN: Soft, non-tender, non-distended MUSCULOSKELETAL:  Ambulates independently SKIN: Warm and dry, no edema NEUROLOGIC:  Alert and oriented x 3.  No focal neuro deficits noted. PSYCHIATRIC:  Normal affect    ASSESSMENT:    1. Shortness of breath   2. Mobitz type II atrioventricular block   3. Cardiac pacemaker in situ   4. Frequent PVCs   5. Peripheral edema   6. Essential hypertension    PLAN:    Shortness of breath Intermittent LE edema -much improved with gen change, but daughter still notes that she sounds short of breath even on the phone -appears euvolemic today -last echo 2020 largely unremarkable -discussed heart failure vs noncardiac causes of SOB -will repeat echo to exclude structural changes that may be leading to symptoms -counseled on red flag warning signs that need immediate medical attention  Second degree AV block type II Permanent DC-PPM Frequent PVCs -followed by Dr. Rayann Heman -s/p gen change for PPM, symptoms improved -on metoprolol for PVCs  Hypertension: Intermittent LE edema -on amlodipine, telmisartan-HCTZ -she has cut back lasix dose to every few days, notices more LE edema with this. Mild, not severe. Discussed do not want on both HCTZ daily and frequent furosemide. She will cut back to only rare as needed on the furosemide. Discussed compression, elevation of legs, which she states helps with edema as well.  Cardiac risk counseling and prevention recommendations: -recommend heart healthy/Mediterranean diet, with whole grains, fruits, vegetable, fish, lean meats, nuts, and olive oil. Limit salt. -recommend moderate walking, 3-5 times/week for 30-50 minutes each session. Aim for at least 150 minutes.week. Goal should be pace of 3 miles/hours, or walking 1.5 miles in 30 minutes -recommend avoidance of tobacco products. Avoid excess alcohol. -ASCVD risk score: The ASCVD Risk score (Arnett DK, et al., 2019) failed to calculate for the following reasons:   The 2019 ASCVD risk score is only valid for ages 88 to 63    Plan for follow up: 1 year or sooner as needed.  Buford Dresser, MD, PhD,  [facility]    Medication Adjustments/Labs and Tests Ordered: Current medicines are reviewed at length with the patient today.  Concerns regarding medicines are outlined above.  Orders Placed This Encounter  Procedures   EKG 12-Lead   ECHOCARDIOGRAM COMPLETE    No orders of the defined types were placed in this encounter.   Patient Instructions  Medication Instructions:  Your physician recommends that you continue on your current medications as directed. Please refer to the Current Medication list given to you today.   *If you need a refill on your cardiac medications before your next appointment, please call your pharmacy*  Lab Work: NONE   Testing/Procedures: Your physician has requested that you have an echocardiogram. Echocardiography is a painless test that uses sound waves to create images of your heart. It provides your doctor with information about the size and shape of your heart and how well your heart's chambers and valves are working. This procedure takes approximately one hour. There are no restrictions for this procedure.    Follow-Up: At [facility], you and your health needs are our priority.  As part of our continuing mission to provide you with exceptional heart care, we have created designated Provider Care Teams.  These Care Teams include your  primary Cardiologist (physician) and Advanced Practice Providers (APPs -  Physician Assistants and Nurse Practitioners) who all work together to provide you with the care you need, when you need it.  We recommend signing up for the patient portal called "MyChart".  Sign up information is provided on this After Visit Summary.  MyChart is used to connect with patients for Virtual Visits (Telemedicine).  Patients are able to view lab/test results, encounter notes, upcoming appointments, etc.  Non-urgent messages can be sent to your provider as well.   To learn more about what you can do with MyChart, go to  [email or web address].    Your next appointment:   12 month(s)  The format for your next appointment:   In Person  Provider:   Buford Dresser, MD     [facility] Stumpf,acting as a scribe for Buford Dresser, MD.,have documented all relevant documentation on the behalf of Buford Dresser, MD,as directed by  Buford Dresser, MD while in the presence of Buford Dresser, MD.  I, Buford Dresser, MD, have reviewed all documentation for this visit. The documentation on 12/30/20 for the exam, diagnosis, procedures, and orders are all accurate and complete.   Signed, Buford Dresser, MD PhD 12/30/2020 1:10 PM    [facility]

## 2021-01-03 ENCOUNTER — Encounter (HOSPITAL_BASED_OUTPATIENT_CLINIC_OR_DEPARTMENT_OTHER): Payer: Self-pay

## 2021-01-06 ENCOUNTER — Ambulatory Visit (INDEPENDENT_AMBULATORY_CARE_PROVIDER_SITE_OTHER): Payer: PPO

## 2021-01-06 ENCOUNTER — Other Ambulatory Visit: Payer: Self-pay

## 2021-01-06 DIAGNOSIS — R0602 Shortness of breath: Secondary | ICD-10-CM

## 2021-01-06 DIAGNOSIS — I10 Essential (primary) hypertension: Secondary | ICD-10-CM

## 2021-01-06 LAB — ECHOCARDIOGRAM COMPLETE
Area-P 1/2: 3.37 cm2
S' Lateral: 2.57 cm

## 2021-01-08 DIAGNOSIS — L821 Other seborrheic keratosis: Secondary | ICD-10-CM | POA: Diagnosis not present

## 2021-01-08 DIAGNOSIS — L814 Other melanin hyperpigmentation: Secondary | ICD-10-CM | POA: Diagnosis not present

## 2021-01-08 DIAGNOSIS — L309 Dermatitis, unspecified: Secondary | ICD-10-CM | POA: Diagnosis not present

## 2021-01-08 DIAGNOSIS — Z85828 Personal history of other malignant neoplasm of skin: Secondary | ICD-10-CM | POA: Diagnosis not present

## 2021-01-14 DIAGNOSIS — R0609 Other forms of dyspnea: Secondary | ICD-10-CM | POA: Diagnosis not present

## 2021-01-14 DIAGNOSIS — G4733 Obstructive sleep apnea (adult) (pediatric): Secondary | ICD-10-CM | POA: Diagnosis not present

## 2021-01-14 DIAGNOSIS — N3281 Overactive bladder: Secondary | ICD-10-CM | POA: Diagnosis not present

## 2021-01-14 DIAGNOSIS — I10 Essential (primary) hypertension: Secondary | ICD-10-CM | POA: Diagnosis not present

## 2021-01-14 DIAGNOSIS — Z23 Encounter for immunization: Secondary | ICD-10-CM | POA: Diagnosis not present

## 2021-01-16 ENCOUNTER — Other Ambulatory Visit: Payer: Self-pay | Admitting: Internal Medicine

## 2021-01-16 DIAGNOSIS — Z1231 Encounter for screening mammogram for malignant neoplasm of breast: Secondary | ICD-10-CM

## 2021-01-21 ENCOUNTER — Other Ambulatory Visit: Payer: Self-pay | Admitting: Internal Medicine

## 2021-01-21 DIAGNOSIS — R0609 Other forms of dyspnea: Secondary | ICD-10-CM

## 2021-01-21 DIAGNOSIS — I10 Essential (primary) hypertension: Secondary | ICD-10-CM

## 2021-02-07 ENCOUNTER — Ambulatory Visit
Admission: RE | Admit: 2021-02-07 | Discharge: 2021-02-07 | Disposition: A | Discharge status: Home / Self Care | Payer: No Typology Code available for payment source | Source: Ambulatory Visit | Attending: Internal Medicine | Admitting: Internal Medicine

## 2021-02-07 DIAGNOSIS — I10 Essential (primary) hypertension: Secondary | ICD-10-CM

## 2021-02-07 DIAGNOSIS — R0609 Other forms of dyspnea: Secondary | ICD-10-CM

## 2021-02-12 ENCOUNTER — Ambulatory Visit: Payer: PPO

## 2021-02-13 DIAGNOSIS — Z950 Presence of cardiac pacemaker: Secondary | ICD-10-CM | POA: Diagnosis not present

## 2021-02-13 DIAGNOSIS — G4733 Obstructive sleep apnea (adult) (pediatric): Secondary | ICD-10-CM | POA: Diagnosis not present

## 2021-02-13 DIAGNOSIS — R7301 Impaired fasting glucose: Secondary | ICD-10-CM | POA: Diagnosis not present

## 2021-02-13 DIAGNOSIS — R0609 Other forms of dyspnea: Secondary | ICD-10-CM | POA: Diagnosis not present

## 2021-02-13 DIAGNOSIS — I10 Essential (primary) hypertension: Secondary | ICD-10-CM | POA: Diagnosis not present

## 2021-02-17 DIAGNOSIS — I10 Essential (primary) hypertension: Secondary | ICD-10-CM | POA: Diagnosis not present

## 2021-02-17 DIAGNOSIS — N183 Chronic kidney disease, stage 3 unspecified: Secondary | ICD-10-CM | POA: Diagnosis not present

## 2021-02-17 DIAGNOSIS — J441 Chronic obstructive pulmonary disease with (acute) exacerbation: Secondary | ICD-10-CM | POA: Diagnosis not present

## 2021-02-17 DIAGNOSIS — E785 Hyperlipidemia, unspecified: Secondary | ICD-10-CM | POA: Diagnosis not present

## 2021-02-24 ENCOUNTER — Ambulatory Visit
Admission: RE | Admit: 2021-02-24 | Discharge: 2021-02-24 | Disposition: A | Discharge status: Home / Self Care | Payer: PPO | Source: Ambulatory Visit | Attending: Internal Medicine | Admitting: Internal Medicine

## 2021-02-24 ENCOUNTER — Other Ambulatory Visit: Payer: Self-pay

## 2021-02-24 DIAGNOSIS — Z1231 Encounter for screening mammogram for malignant neoplasm of breast: Secondary | ICD-10-CM

## 2021-03-06 ENCOUNTER — Ambulatory Visit (INDEPENDENT_AMBULATORY_CARE_PROVIDER_SITE_OTHER): Payer: PPO

## 2021-03-06 DIAGNOSIS — I441 Atrioventricular block, second degree: Secondary | ICD-10-CM | POA: Diagnosis not present

## 2021-03-07 ENCOUNTER — Ambulatory Visit: Payer: PPO | Admitting: Internal Medicine

## 2021-03-07 ENCOUNTER — Other Ambulatory Visit: Payer: Self-pay

## 2021-03-07 ENCOUNTER — Encounter: Payer: Self-pay | Admitting: Internal Medicine

## 2021-03-07 VITALS — BP 136/80 | HR 80 | Ht 60.0 in | Wt 203.2 lb

## 2021-03-07 DIAGNOSIS — I10 Essential (primary) hypertension: Secondary | ICD-10-CM | POA: Diagnosis not present

## 2021-03-07 DIAGNOSIS — I441 Atrioventricular block, second degree: Secondary | ICD-10-CM

## 2021-03-07 LAB — CUP PACEART REMOTE DEVICE CHECK
Battery Remaining Longevity: 135 mo
Battery Voltage: 3.2 V
Brady Statistic AP VP Percent: 18.99 %
Brady Statistic AP VS Percent: 0.1 %
Brady Statistic AS VP Percent: 79.46 %
Brady Statistic AS VS Percent: 1.44 %
Brady Statistic RA Percent Paced: 19.51 %
Brady Statistic RV Percent Paced: 98.45 %
Date Time Interrogation Session: 20221118143004
Implantable Lead Implant Date: 20100217
Implantable Lead Implant Date: 20100217
Implantable Lead Location: 753859
Implantable Lead Location: 753860
Implantable Lead Model: 5076
Implantable Lead Model: 5076
Implantable Pulse Generator Implant Date: 20220816
Lead Channel Impedance Value: 361 Ohm
Lead Channel Impedance Value: 380 Ohm
Lead Channel Impedance Value: 513 Ohm
Lead Channel Impedance Value: 551 Ohm
Lead Channel Pacing Threshold Amplitude: 0.5 V
Lead Channel Pacing Threshold Amplitude: 0.75 V
Lead Channel Pacing Threshold Pulse Width: 0.4 ms
Lead Channel Pacing Threshold Pulse Width: 0.4 ms
Lead Channel Sensing Intrinsic Amplitude: 0.375 mV
Lead Channel Sensing Intrinsic Amplitude: 2.375 mV
Lead Channel Sensing Intrinsic Amplitude: 8.25 mV
Lead Channel Setting Pacing Amplitude: 1.5 V
Lead Channel Setting Pacing Amplitude: 2.5 V
Lead Channel Setting Pacing Pulse Width: 0.4 ms
Lead Channel Setting Sensing Sensitivity: 4 mV

## 2021-03-07 LAB — CUP PACEART INCLINIC DEVICE CHECK
Battery Remaining Longevity: 135 mo
Battery Voltage: 3.2 V
Brady Statistic AP VP Percent: 18.99 %
Brady Statistic AP VS Percent: 0.1 %
Brady Statistic AS VP Percent: 79.46 %
Brady Statistic AS VS Percent: 1.44 %
Brady Statistic RA Percent Paced: 19.51 %
Brady Statistic RV Percent Paced: 98.45 %
Date Time Interrogation Session: 20221118145204
Implantable Lead Implant Date: 20100217
Implantable Lead Implant Date: 20100217
Implantable Lead Location: 753859
Implantable Lead Location: 753860
Implantable Lead Model: 5076
Implantable Lead Model: 5076
Implantable Pulse Generator Implant Date: 20220816
Lead Channel Impedance Value: 361 Ohm
Lead Channel Impedance Value: 380 Ohm
Lead Channel Impedance Value: 513 Ohm
Lead Channel Impedance Value: 551 Ohm
Lead Channel Pacing Threshold Amplitude: 0.5 V
Lead Channel Pacing Threshold Amplitude: 0.75 V
Lead Channel Pacing Threshold Pulse Width: 0.4 ms
Lead Channel Pacing Threshold Pulse Width: 0.4 ms
Lead Channel Sensing Intrinsic Amplitude: 0.375 mV
Lead Channel Sensing Intrinsic Amplitude: 8.25 mV
Lead Channel Setting Pacing Amplitude: 1.5 V
Lead Channel Setting Pacing Amplitude: 2.5 V
Lead Channel Setting Pacing Pulse Width: 0.4 ms
Lead Channel Setting Sensing Sensitivity: 4 mV

## 2021-03-07 NOTE — Progress Notes (Signed)
PCP: Tracey Battles, [name]   Primary EP:  [name] is a 86 y.o. female who presents today for routine electrophysiology followup.  Since her recent generator change, the patient reports doing very well. Denies procedure related complications.  Today, she denies symptoms of palpitations, chest pain, shortness of breath,  lower extremity edema, dizziness, presyncope, or syncope.  The patient is otherwise without complaint today.   Past Medical History:  Diagnosis Date   Abnormal heart rhythm    Anemia    hx of not recent    Atypical chest pain    Cardiomegaly    Chronic headaches    Chronic kidney disease    Classic migraine with aura 12/17/2014   Depression    Dysrhythmia    HX OF MOBITZ 2 AV BLOCK AND FUNCTIONING PACEMAKER -Tracey. Armella Stogner FOLLOWS PT'S PACEMAKER CARE   GERD (gastroesophageal reflux disease)    Headache(784.0)    PT STATES HER CHRONIC H/A'S HAVE RESOLVED   Hypercholesteremia    Hyperlipidemia    Hypertension    Obstructive sleep apnea on CPAP    Osteoarthritis    Pacemaker    Recurrent upper respiratory infection (URI)    MARCH 2013 DX WITH BRONCHITIS--PT STATES RESOLVED--DOES HAVE SOME ALLERGIES AT PRESENT   Second degree Mobitz II AV block    s/p PPM by Tracey Verlon Setting 2010   Sleep apnea    on CPAP-uses [facility]   Urinary incontinence    Past Surgical History:  Procedure Laterality Date   CARPAL TUNNEL RELEASE Bilateral    CATARACT EXTRACTION Left    CESAREAN SECTION     FOOT SURGERY     HAND SURGERY     KNEE CLOSED REDUCTION  11/30/2011   Procedure: CLOSED MANIPULATION KNEE;  Surgeon: Gearlean Alf, [name];  Location: [facility];  Service: Orthopedics;  Laterality: Left;   KNEE SURGERY     Arthroscopic surg   PACEMAKER INSERTION  2010   dual-chamber (MDT) implanted by Tracey Verlon Setting   [facility] GENERATOR CHANGEOUT N/A 12/03/2020   Procedure: PPM GENERATOR CHANGEOUT;  Surgeon: [name], [name];  Location: [place] [facility];  Service: Cardiovascular;   Laterality: N/A;   TOTAL KNEE ARTHROPLASTY Bilateral    right   TOTAL KNEE ARTHROPLASTY  08/24/2011   Procedure: TOTAL KNEE ARTHROPLASTY;  Surgeon: Gearlean Alf, [name];  Location: [facility];  Service: Orthopedics;  Laterality: Left;   TUBAL LIGATION  1968    ROS- all systems are reviewed and negative except as per HPI above  Current Outpatient Medications  Medication Sig Dispense Refill   amLODipine (NORVASC) 5 MG tablet Take 5 mg by mouth at bedtime.     clindamycin (CLEOCIN) 300 MG capsule Take 600 mg by mouth See admin instructions. Take 600 mg before dental work then 600 mg after the dental work     furosemide (LASIX) 20 MG tablet Take 1 tablet (20 mg total) by mouth daily as needed. 30 tablet 6   metoprolol succinate (TOPROL-XL) 50 MG 24 hr tablet Take 50 mg by mouth daily.     Multiple Vitamins-Minerals (PRESERVISION AREDS 2) CAPS Take 1 capsule by mouth 2 (two) times daily.     omeprazole (PRILOSEC) 20 MG capsule Take 20 mg by mouth every morning.     simvastatin (ZOCOR) 40 MG tablet Take 40 mg by mouth at bedtime.     telmisartan-hydrochlorothiazide (MICARDIS HCT) 80-12.5 MG per tablet Take 1 tablet by mouth daily with breakfast.  escitalopram (LEXAPRO) 10 MG tablet Take 10 mg by mouth daily. (Patient not taking: Reported on 03/07/2021)     Omega-3 Fatty Acids (FISH OIL) 1200 MG CAPS Take 1,200 mg by mouth every evening. (Patient not taking: Reported on 12/30/2020)     oxybutynin (DITROPAN) 5 MG tablet Take 1 tablet by mouth 2 (two) times daily.  (Patient not taking: Reported on 03/07/2021)  0   No current facility-administered medications for this visit.    Physical Exam: Vitals:   03/07/21 1354  SpO2: 96%  Weight: 203 lb 3.2 oz (92.2 kg)  Height: 5' (1.524 m)    GEN- The patient is well appearing, alert and oriented x 3 today.   Head- normocephalic, atraumatic Eyes-  Sclera clear, conjunctiva pink Ears- hearing intact Oropharynx- clear Lungs- Clear to ausculation  bilaterally, normal work of breathing Chest- pacemaker pocket is well healed Heart- Regular rate and rhythm, no murmurs, rubs or gallops, PMI not laterally displaced GI- soft, NT, ND, + BS Extremities- no clubbing, cyanosis, or edema  Pacemaker interrogation- reviewed in detail today,  See PACEART report  ekg tracing ordered today is personally reviewed and shows sinus with V pacing  Echo 01/06/21- EF 65%  Assessment and Plan:  1. Symptomatic mobitz II heart block Normal pacemaker function See Pace Art report There was far field sensing on the atrial lead.  This was remedied by reducing atrial sensitivity from 0.3 to 0.45 mV today she is not device dependant today  2. HTN Stable No change required today  Return to see EP APP in a year  [name] [name], [facility] 03/07/2021 2:06 PM

## 2021-03-07 NOTE — Patient Instructions (Addendum)
Medication Instructions:  Your physician recommends that you continue on your current medications as directed. Please refer to the Current Medication list given to you today. *If you need a refill on your cardiac medications before your next appointment, please call your pharmacy*  Lab Work: None. If you have labs (blood work) drawn today and your tests are completely normal, you will receive your results only by: [place] (if you have MyChart) OR A paper copy in the mail If you have any lab test that is abnormal or we need to change your treatment, we will call you to review the results.  Testing/Procedures: None.  Follow-Up: At [facility], you and your health needs are our priority.  As part of our continuing mission to provide you with exceptional heart care, we have created designated Provider Care Teams.  These Care Teams include your primary Cardiologist (physician) and Advanced Practice Providers (APPs -  Physician Assistants and Nurse Practitioners) who all work together to provide you with the care you need, when you need it.  Your physician wants you to follow-up in: 12 months with  [name], MD or one of the following Advanced Practice Providers on your designated Care Team:    Tommye Standard, PA-C    You will receive a reminder letter in the mail two months in advance. If you don't receive a letter, please call our office to schedule the follow-up appointment.  Remote monitoring is used to monitor your Pacemaker from home. This monitoring reduces the number of office visits required to check your device to one time per year. It allows [place] to keep an eye on the functioning of your device to ensure it is working properly. You are scheduled for a device check from home on 06-05-21. You may send your transmission at any time that day. If you have a wireless device, the transmission will be sent automatically. After your physician reviews your transmission, you will receive a  postcard with your next transmission date.  We recommend signing up for the patient portal called "MyChart".  Sign up information is provided on this After Visit Summary.  MyChart is used to connect with patients for Virtual Visits (Telemedicine).  Patients are able to view lab/test results, encounter notes, upcoming appointments, etc.  Non-urgent messages can be sent to your provider as well.   To learn more about what you can do with MyChart, go to [email or web address].    Any Other Special Instructions Will Be Listed Below (If Applicable).

## 2021-03-17 NOTE — Progress Notes (Signed)
Remote pacemaker transmission.   

## 2021-04-18 DIAGNOSIS — I10 Essential (primary) hypertension: Secondary | ICD-10-CM | POA: Diagnosis not present

## 2021-04-18 DIAGNOSIS — N183 Chronic kidney disease, stage 3 unspecified: Secondary | ICD-10-CM | POA: Diagnosis not present

## 2021-04-18 DIAGNOSIS — E785 Hyperlipidemia, unspecified: Secondary | ICD-10-CM | POA: Diagnosis not present

## 2021-05-04 ENCOUNTER — Other Ambulatory Visit: Payer: Self-pay | Admitting: Student

## 2021-05-04 DIAGNOSIS — R609 Edema, unspecified: Secondary | ICD-10-CM

## 2021-06-05 ENCOUNTER — Ambulatory Visit (INDEPENDENT_AMBULATORY_CARE_PROVIDER_SITE_OTHER): Payer: PPO

## 2021-06-05 DIAGNOSIS — I441 Atrioventricular block, second degree: Secondary | ICD-10-CM | POA: Diagnosis not present

## 2021-06-05 LAB — CUP PACEART REMOTE DEVICE CHECK
Battery Remaining Longevity: 123 mo
Battery Voltage: 3.15 V
Brady Statistic AP VP Percent: 11.66 %
Brady Statistic AP VS Percent: 0.04 %
Brady Statistic AS VP Percent: 86.49 %
Brady Statistic AS VS Percent: 1.81 %
Brady Statistic RA Percent Paced: 12.01 %
Brady Statistic RV Percent Paced: 98.15 %
Date Time Interrogation Session: 20230216022524
Implantable Lead Implant Date: 20100217
Implantable Lead Implant Date: 20100217
Implantable Lead Location: 753859
Implantable Lead Location: 753860
Implantable Lead Model: 5076
Implantable Lead Model: 5076
Implantable Pulse Generator Implant Date: 20220816
Lead Channel Impedance Value: 361 Ohm
Lead Channel Impedance Value: 361 Ohm
Lead Channel Impedance Value: 399 Ohm
Lead Channel Impedance Value: 418 Ohm
Lead Channel Pacing Threshold Amplitude: 0.5 V
Lead Channel Pacing Threshold Amplitude: 0.75 V
Lead Channel Pacing Threshold Pulse Width: 0.4 ms
Lead Channel Pacing Threshold Pulse Width: 0.4 ms
Lead Channel Sensing Intrinsic Amplitude: 12.25 mV
Lead Channel Sensing Intrinsic Amplitude: 12.25 mV
Lead Channel Sensing Intrinsic Amplitude: 2.125 mV
Lead Channel Sensing Intrinsic Amplitude: 2.125 mV
Lead Channel Setting Pacing Amplitude: 1.5 V
Lead Channel Setting Pacing Amplitude: 2.5 V
Lead Channel Setting Pacing Pulse Width: 0.4 ms
Lead Channel Setting Sensing Sensitivity: 4 mV

## 2021-06-10 NOTE — Progress Notes (Signed)
Remote pacemaker transmission.   

## 2021-06-13 DIAGNOSIS — R051 Acute cough: Secondary | ICD-10-CM | POA: Diagnosis not present

## 2021-06-13 DIAGNOSIS — J069 Acute upper respiratory infection, unspecified: Secondary | ICD-10-CM | POA: Diagnosis not present

## 2021-06-13 DIAGNOSIS — I10 Essential (primary) hypertension: Secondary | ICD-10-CM | POA: Diagnosis not present

## 2021-06-13 NOTE — Progress Notes (Signed)
Patient ID: [name], female    DOB: August 13, 1934, 87 y.o.   MRN: [id number]  HPI Female never smoker followed for cough/ GERD, OSA/CPAP, complicated by HBP, [id number]/ Pacemaker NPSG 11/14/00- AHI 22/ hr, desaturation to 84% PFT at [facility] 03/18/10 showed mild reversible small  airway obstruction  -------------------------------------------------------------------------------------------------------.   06/14/20-  87 year old female never smoker followed for OSA/CPAP, Cough/ GERD,  complicated by HBP, [id number]/ pacemaker CPAP auto 7-15/ [facility]- compliance 97%, AHI 2/ hr Body weight today- Covid vax-3 Phizer Flu vax- had -----Patient states that she has had pneumonia since being here last, has shortness of breath with exertion. She was short of breath walking back to room from lobby. Dry cough. CPAP Machine is working good but states she is having a lot of trouble falling asleep  Since her husband died 2 yrs ago and then son moved out, she is alone. Gets busy brain at night, fretting, and has trouble falling asleep before about 11:30. May read. Goes back to bed in AM after letting dog out and may be in bed till 10AM. No naps. Melatonin no help.   Pneumonia in December, Covid neg, Rx's outpatient by PCP w CXR reported pneumonia. Since then some persistent dry cough- intermittent.   06/16/21- 87 year old female never smoker followed for OSA/CPAP, Cough/ GERD,  complicated by HBP, [id number]/ pacemaker CPAP auto 7-15/ [facility]- compliance 100%, AHI 1.2/ hr Body weight today-202 lbs Covid vax-4 Phizer                        Covid test here today Negative Flu vax- had -----Last Tuesday started a productive cough with with clear sputum. Has increased shortness of breath since cough started. Has been taking  AZITHROMYCIN since Friday.  Acute upper respiratory symptoms x5 days starting with bad sore throat, cough productive mostly clear but no fever or myalgias.   Beginning to get better now but still mildly short of breath.  Feels like a "cold". Reports doing well with CPAP.  "Life is better". Uses [employer] machine- discussed. Had been very short of breath in recent months until she got pacemaker replaced-dramatic difference.  Review of Systems-See HPI + = positive Constitutional:   No-   weight loss, night sweats, fevers, chills, fatigue, lassitude. HEENT:   No-  headaches, difficulty swallowing, tooth/dental problems, sore throat,       No-  sneezing, itching, ear ache, nasal congestion, post nasal drip,  CV:  No-   chest pain, orthopnea, PND, swelling in lower extremities, anasarca, as dizziness, palpitations Resp: No-   shortness of breath with exertion or at rest.               productive cough,  + non-productive cough,  No- coughing up of blood.               change in color of mucus.  No- wheezing.   Skin: No-   rash or lesions. GI:  No-   heartburn, indigestion, abdominal pain, nausea, vomiting,  GU:  MS:  No-   joint pain or swelling.   Neuro-     nothing unusual Psych:  No- change in mood or affect. No depression or anxiety.  No memory loss.    Objective:   Physical Exam General- Alert, Oriented, Affect-appropriate, Distress- none acute, + obese Skin- rash-none, lesions- none, excoriation- none Lymphadenopathy- none Head- atraumatic            Eyes- Gross vision intact,  PERRLA, conjunctivae clear secretions            Ears- Hearing, canals-normal            Nose- Clear, no-Septal dev, mucosa clear polyps, erosion, perforation             Throat- Mallampati III-IV , mucosa+ minimally red , drainage- none, tonsils- atrophic Neck- flexible , trachea midline, no stridor , thyroid nl, carotid no bruit Chest - symmetrical excursion , unlabored           Heart/CV- RRR , no murmur , no gallop  , no rub, nl s1 s2                           - JVD- none , edema- none, stasis changes- none, varices- none           Lung- + mild tachypnea during  speech, wheeze-none cough- none , dullness-none, rub- none. +somewhat breathless talking through N95 mask.           Chest wall-+ L pacemaker Abd-  Br/ Gen/ Rectal- Not done, not indicated Extrem- cyanosis- none, clubbing, none, atrophy- none, strength- nl Neuro- grossly intact to observation

## 2021-06-15 ENCOUNTER — Encounter: Payer: Self-pay | Admitting: Internal Medicine

## 2021-06-16 ENCOUNTER — Ambulatory Visit: Payer: PPO | Admitting: Internal Medicine

## 2021-06-16 ENCOUNTER — Other Ambulatory Visit: Payer: Self-pay

## 2021-06-16 ENCOUNTER — Encounter: Payer: Self-pay | Admitting: Internal Medicine

## 2021-06-16 VITALS — BP 128/72 | HR 91 | Temp 98.2°F | Ht 60.0 in | Wt 202.2 lb

## 2021-06-16 DIAGNOSIS — R051 Acute cough: Secondary | ICD-10-CM

## 2021-06-16 DIAGNOSIS — J069 Acute upper respiratory infection, unspecified: Secondary | ICD-10-CM | POA: Diagnosis not present

## 2021-06-16 DIAGNOSIS — G4733 Obstructive sleep apnea (adult) (pediatric): Secondary | ICD-10-CM

## 2021-06-16 LAB — POC COVID19 BINAXNOW: SARS Coronavirus 2 Ag: NEGATIVE

## 2021-06-16 NOTE — Patient Instructions (Signed)
We can continue CPAP auto auto 7-15  Ok to finish your azithromycin antibiotic. Let me know if you start feeling worse, but hopefully you will gradually get over this infection.  Please call if wee can help

## 2021-06-25 ENCOUNTER — Encounter: Payer: Self-pay | Admitting: Internal Medicine

## 2021-06-25 DIAGNOSIS — J069 Acute upper respiratory infection, unspecified: Secondary | ICD-10-CM | POA: Insufficient documentation

## 2021-06-25 NOTE — Assessment & Plan Note (Signed)
Symptoms compatible with a viral URI.  ? Plan-COVID testing done-negative.  Finish azithromycin antibiotic ?

## 2021-06-25 NOTE — Assessment & Plan Note (Signed)
Benefits from CPAP with good compliance and control ?Plan-continue auto 7-15 ?

## 2021-07-17 DIAGNOSIS — H35033 Hypertensive retinopathy, bilateral: Secondary | ICD-10-CM | POA: Diagnosis not present

## 2021-07-17 DIAGNOSIS — H35372 Puckering of macula, left eye: Secondary | ICD-10-CM | POA: Diagnosis not present

## 2021-07-17 DIAGNOSIS — H353132 Nonexudative age-related macular degeneration, bilateral, intermediate dry stage: Secondary | ICD-10-CM | POA: Diagnosis not present

## 2021-07-17 DIAGNOSIS — H35373 Puckering of macula, bilateral: Secondary | ICD-10-CM | POA: Diagnosis not present

## 2021-07-17 DIAGNOSIS — H524 Presbyopia: Secondary | ICD-10-CM | POA: Diagnosis not present

## 2021-07-17 DIAGNOSIS — H04123 Dry eye syndrome of bilateral lacrimal glands: Secondary | ICD-10-CM | POA: Diagnosis not present

## 2021-07-17 DIAGNOSIS — G4733 Obstructive sleep apnea (adult) (pediatric): Secondary | ICD-10-CM | POA: Diagnosis not present

## 2021-07-21 DIAGNOSIS — M25522 Pain in left elbow: Secondary | ICD-10-CM | POA: Diagnosis not present

## 2021-07-21 DIAGNOSIS — M19022 Primary osteoarthritis, left elbow: Secondary | ICD-10-CM | POA: Diagnosis not present

## 2021-07-23 IMAGING — MG MM DIGITAL SCREENING BILAT W/ TOMO W/ CAD
6 of 10 series · 6 of 30 positions shown · non-contrast
Comparison: Previous exam(s).

CLINICAL DATA: Screening.

EXAM:
DIGITAL SCREENING BILATERAL MAMMOGRAM WITH TOMO AND CAD

[R MLO synth-2D]
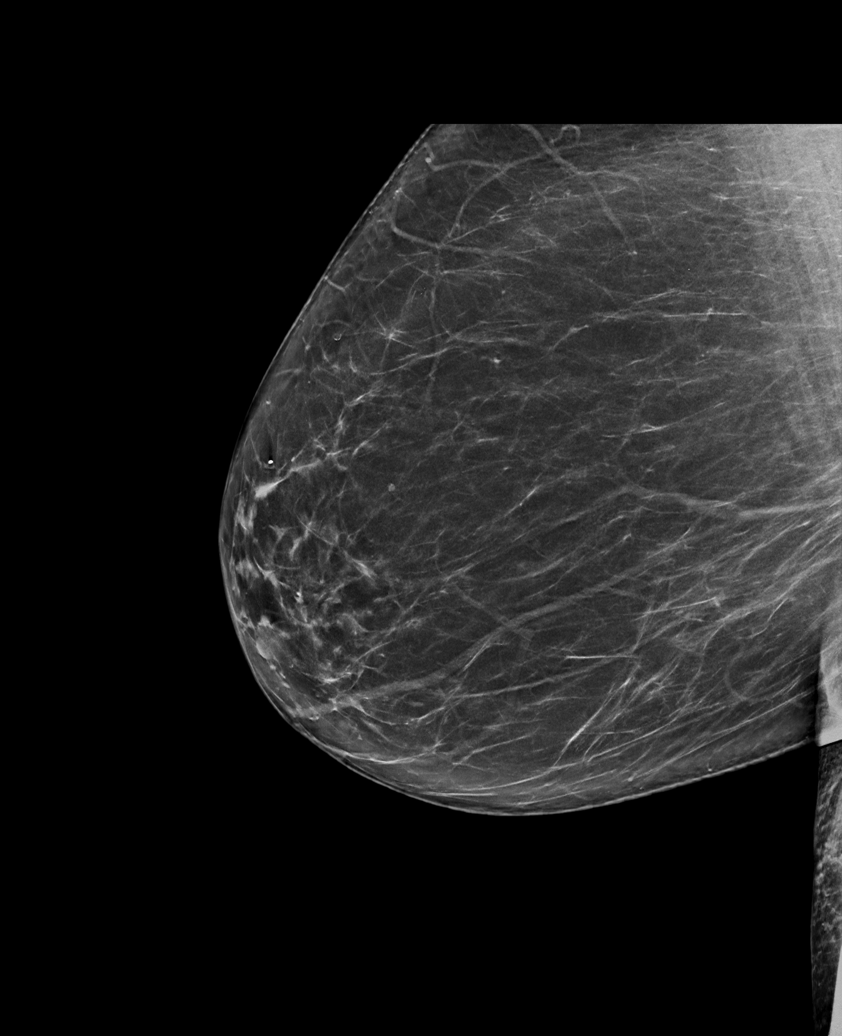

[R CC synth-2D]
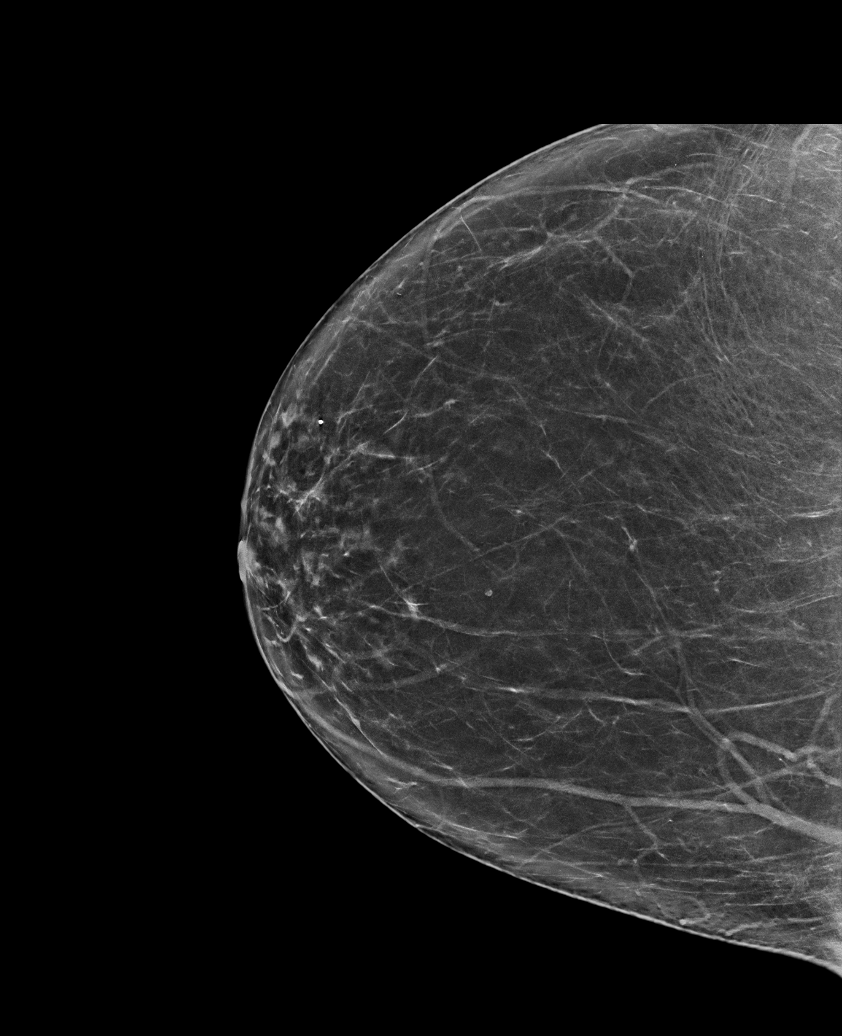

[L CC synth-2D]
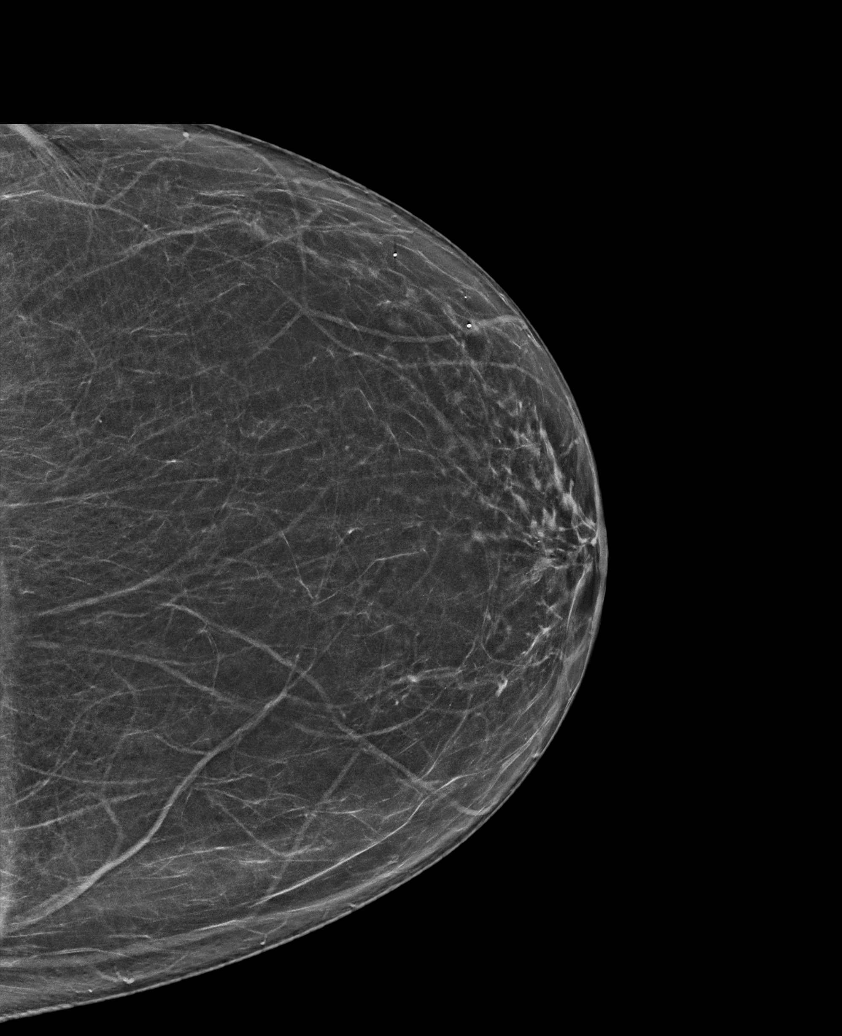

[L MLO synth-2D (1 of 2)]
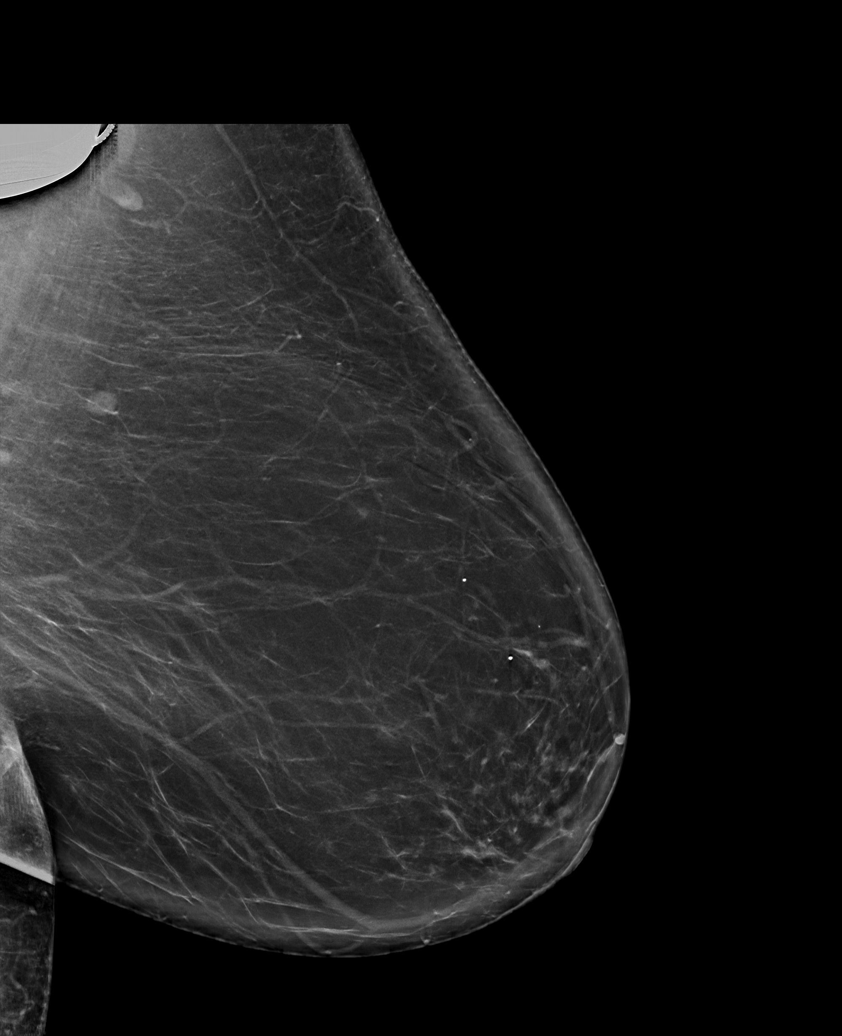

[L MLO synth-2D (2 of 2)]
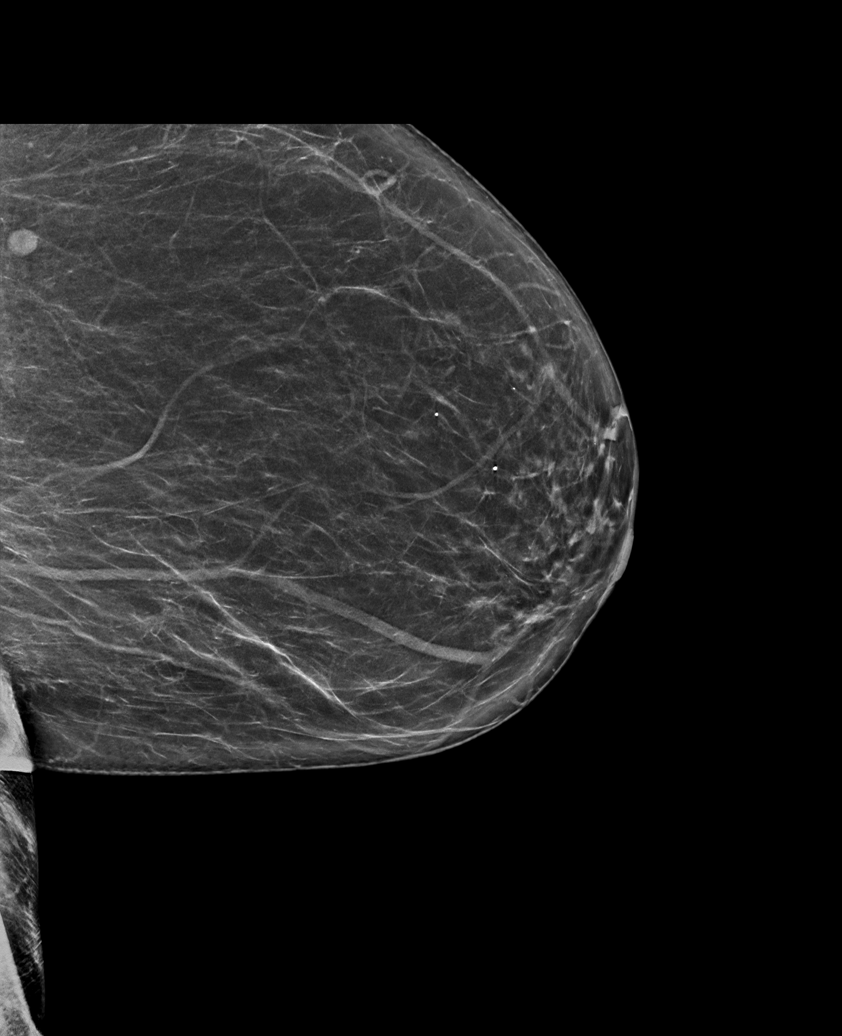

[L MLO tomo · tomo slice 38/75.0]
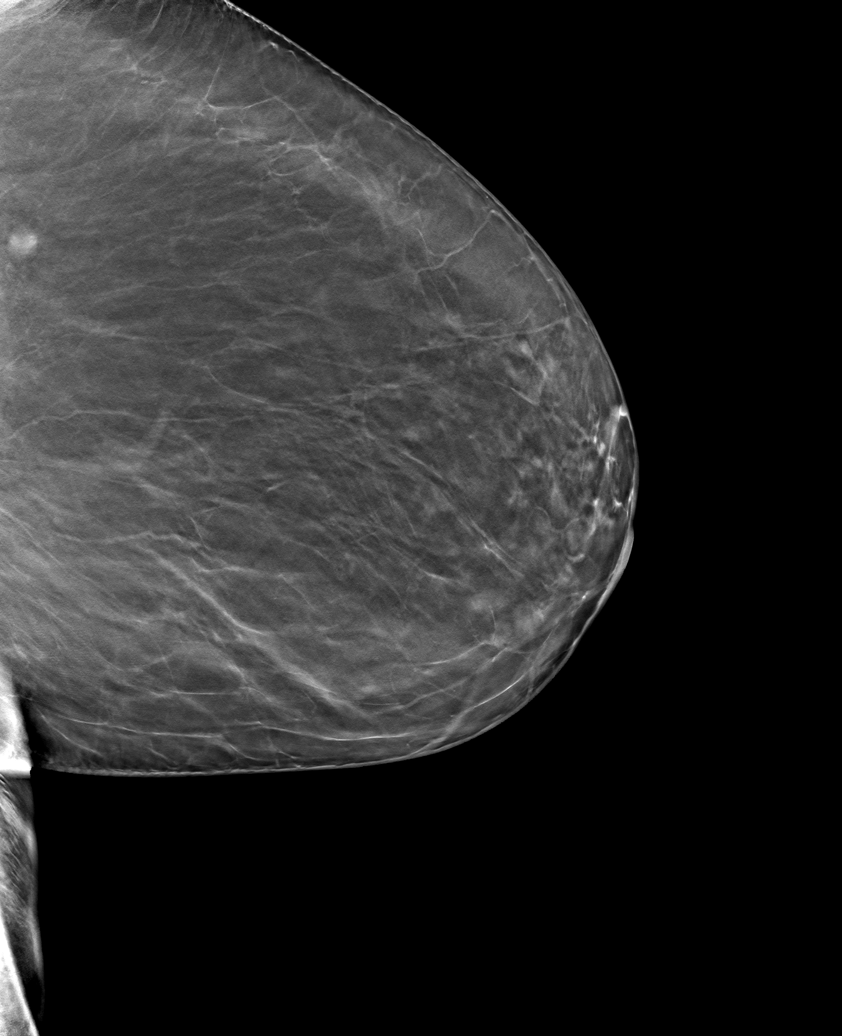

[6 of 30 positions shown; findings below may reference images not displayed]

ACR Breast Density Category b: There are scattered areas of
fibroglandular density.
FINDINGS: There are no findings suspicious for malignancy. Images were
processed with CAD.
IMPRESSION: No mammographic evidence of malignancy. A result letter of this
screening mammogram will be mailed directly to the patient.

RECOMMENDATION:
Screening mammogram in one year. (Code:[id number])

BI-RADS CATEGORY  1: Negative.

## 2021-08-14 DIAGNOSIS — E785 Hyperlipidemia, unspecified: Secondary | ICD-10-CM | POA: Diagnosis not present

## 2021-08-14 DIAGNOSIS — I10 Essential (primary) hypertension: Secondary | ICD-10-CM | POA: Diagnosis not present

## 2021-08-14 DIAGNOSIS — R7301 Impaired fasting glucose: Secondary | ICD-10-CM | POA: Diagnosis not present

## 2021-08-21 DIAGNOSIS — Z1339 Encounter for screening examination for other mental health and behavioral disorders: Secondary | ICD-10-CM | POA: Diagnosis not present

## 2021-08-21 DIAGNOSIS — R82998 Other abnormal findings in urine: Secondary | ICD-10-CM | POA: Diagnosis not present

## 2021-08-21 DIAGNOSIS — J441 Chronic obstructive pulmonary disease with (acute) exacerbation: Secondary | ICD-10-CM | POA: Diagnosis not present

## 2021-08-21 DIAGNOSIS — Z0000 Encounter for general adult medical examination without abnormal findings: Secondary | ICD-10-CM | POA: Diagnosis not present

## 2021-08-21 DIAGNOSIS — Z1331 Encounter for screening for depression: Secondary | ICD-10-CM | POA: Diagnosis not present

## 2021-08-21 DIAGNOSIS — R7301 Impaired fasting glucose: Secondary | ICD-10-CM | POA: Diagnosis not present

## 2021-08-21 DIAGNOSIS — I10 Essential (primary) hypertension: Secondary | ICD-10-CM | POA: Diagnosis not present

## 2021-08-21 DIAGNOSIS — E785 Hyperlipidemia, unspecified: Secondary | ICD-10-CM | POA: Diagnosis not present

## 2021-08-21 DIAGNOSIS — G4733 Obstructive sleep apnea (adult) (pediatric): Secondary | ICD-10-CM | POA: Diagnosis not present

## 2021-08-21 DIAGNOSIS — K219 Gastro-esophageal reflux disease without esophagitis: Secondary | ICD-10-CM | POA: Diagnosis not present

## 2021-08-21 DIAGNOSIS — R2681 Unsteadiness on feet: Secondary | ICD-10-CM | POA: Diagnosis not present

## 2021-09-04 ENCOUNTER — Ambulatory Visit (INDEPENDENT_AMBULATORY_CARE_PROVIDER_SITE_OTHER): Payer: PPO

## 2021-09-04 DIAGNOSIS — I441 Atrioventricular block, second degree: Secondary | ICD-10-CM

## 2021-09-05 LAB — CUP PACEART REMOTE DEVICE CHECK
Battery Remaining Longevity: 121 mo
Battery Voltage: 3.08 V
Brady Statistic AP VP Percent: 8.48 %
Brady Statistic AP VS Percent: 0.04 %
Brady Statistic AS VP Percent: 89.84 %
Brady Statistic AS VS Percent: 1.64 %
Brady Statistic RA Percent Paced: 8.74 %
Brady Statistic RV Percent Paced: 98.32 %
Date Time Interrogation Session: 20230517235322
Implantable Lead Implant Date: 20100217
Implantable Lead Implant Date: 20100217
Implantable Lead Location: 753859
Implantable Lead Location: 753860
Implantable Lead Model: 5076
Implantable Lead Model: 5076
Implantable Pulse Generator Implant Date: 20220816
Lead Channel Impedance Value: 380 Ohm
Lead Channel Impedance Value: 380 Ohm
Lead Channel Impedance Value: 418 Ohm
Lead Channel Impedance Value: 437 Ohm
Lead Channel Pacing Threshold Amplitude: 0.5 V
Lead Channel Pacing Threshold Amplitude: 0.75 V
Lead Channel Pacing Threshold Pulse Width: 0.4 ms
Lead Channel Pacing Threshold Pulse Width: 0.4 ms
Lead Channel Sensing Intrinsic Amplitude: 1.875 mV
Lead Channel Sensing Intrinsic Amplitude: 1.875 mV
Lead Channel Sensing Intrinsic Amplitude: 20.625 mV
Lead Channel Sensing Intrinsic Amplitude: 20.625 mV
Lead Channel Setting Pacing Amplitude: 1.5 V
Lead Channel Setting Pacing Amplitude: 2.5 V
Lead Channel Setting Pacing Pulse Width: 0.4 ms
Lead Channel Setting Sensing Sensitivity: 4 mV

## 2021-09-10 ENCOUNTER — Encounter (INDEPENDENT_AMBULATORY_CARE_PROVIDER_SITE_OTHER): Payer: PPO | Admitting: Ophthalmology

## 2021-09-10 DIAGNOSIS — H35033 Hypertensive retinopathy, bilateral: Secondary | ICD-10-CM | POA: Diagnosis not present

## 2021-09-10 DIAGNOSIS — H353132 Nonexudative age-related macular degeneration, bilateral, intermediate dry stage: Secondary | ICD-10-CM

## 2021-09-10 DIAGNOSIS — H43813 Vitreous degeneration, bilateral: Secondary | ICD-10-CM | POA: Diagnosis not present

## 2021-09-10 DIAGNOSIS — I10 Essential (primary) hypertension: Secondary | ICD-10-CM | POA: Diagnosis not present

## 2021-09-16 NOTE — Progress Notes (Signed)
Remote pacemaker transmission.   

## 2021-09-23 DIAGNOSIS — R051 Acute cough: Secondary | ICD-10-CM | POA: Diagnosis not present

## 2021-09-23 DIAGNOSIS — I10 Essential (primary) hypertension: Secondary | ICD-10-CM | POA: Diagnosis not present

## 2021-09-23 DIAGNOSIS — E785 Hyperlipidemia, unspecified: Secondary | ICD-10-CM | POA: Diagnosis not present

## 2021-09-23 DIAGNOSIS — U071 COVID-19: Secondary | ICD-10-CM | POA: Diagnosis not present

## 2021-09-23 DIAGNOSIS — Z1152 Encounter for screening for COVID-19: Secondary | ICD-10-CM | POA: Diagnosis not present

## 2021-09-23 DIAGNOSIS — J441 Chronic obstructive pulmonary disease with (acute) exacerbation: Secondary | ICD-10-CM | POA: Diagnosis not present

## 2021-09-23 DIAGNOSIS — R5383 Other fatigue: Secondary | ICD-10-CM | POA: Diagnosis not present

## 2021-09-23 DIAGNOSIS — R0981 Nasal congestion: Secondary | ICD-10-CM | POA: Diagnosis not present

## 2021-09-23 DIAGNOSIS — G4733 Obstructive sleep apnea (adult) (pediatric): Secondary | ICD-10-CM | POA: Diagnosis not present

## 2021-09-29 ENCOUNTER — Encounter (INDEPENDENT_AMBULATORY_CARE_PROVIDER_SITE_OTHER): Payer: PPO | Admitting: Ophthalmology

## 2021-10-01 ENCOUNTER — Encounter (INDEPENDENT_AMBULATORY_CARE_PROVIDER_SITE_OTHER): Payer: PPO | Admitting: Ophthalmology

## 2021-10-07 ENCOUNTER — Encounter (INDEPENDENT_AMBULATORY_CARE_PROVIDER_SITE_OTHER): Payer: PPO | Admitting: Ophthalmology

## 2021-10-09 ENCOUNTER — Encounter (INDEPENDENT_AMBULATORY_CARE_PROVIDER_SITE_OTHER): Payer: PPO | Admitting: Ophthalmology

## 2021-10-09 DIAGNOSIS — H35372 Puckering of macula, left eye: Secondary | ICD-10-CM | POA: Diagnosis not present

## 2021-10-09 DIAGNOSIS — H353132 Nonexudative age-related macular degeneration, bilateral, intermediate dry stage: Secondary | ICD-10-CM | POA: Diagnosis not present

## 2021-10-17 DIAGNOSIS — J441 Chronic obstructive pulmonary disease with (acute) exacerbation: Secondary | ICD-10-CM | POA: Diagnosis not present

## 2021-10-17 DIAGNOSIS — E785 Hyperlipidemia, unspecified: Secondary | ICD-10-CM | POA: Diagnosis not present

## 2021-10-17 DIAGNOSIS — I10 Essential (primary) hypertension: Secondary | ICD-10-CM | POA: Diagnosis not present

## 2021-10-17 DIAGNOSIS — N1831 Chronic kidney disease, stage 3a: Secondary | ICD-10-CM | POA: Diagnosis not present

## 2021-11-03 DIAGNOSIS — R260 Ataxic gait: Secondary | ICD-10-CM | POA: Diagnosis not present

## 2021-11-03 DIAGNOSIS — R2681 Unsteadiness on feet: Secondary | ICD-10-CM | POA: Diagnosis not present

## 2021-11-03 DIAGNOSIS — M6281 Muscle weakness (generalized): Secondary | ICD-10-CM | POA: Diagnosis not present

## 2021-11-12 DIAGNOSIS — R260 Ataxic gait: Secondary | ICD-10-CM | POA: Diagnosis not present

## 2021-11-12 DIAGNOSIS — M6281 Muscle weakness (generalized): Secondary | ICD-10-CM | POA: Diagnosis not present

## 2021-11-12 DIAGNOSIS — R2681 Unsteadiness on feet: Secondary | ICD-10-CM | POA: Diagnosis not present

## 2021-11-17 DIAGNOSIS — M6281 Muscle weakness (generalized): Secondary | ICD-10-CM | POA: Diagnosis not present

## 2021-11-17 DIAGNOSIS — R260 Ataxic gait: Secondary | ICD-10-CM | POA: Diagnosis not present

## 2021-11-17 DIAGNOSIS — R2681 Unsteadiness on feet: Secondary | ICD-10-CM | POA: Diagnosis not present

## 2021-11-19 DIAGNOSIS — R260 Ataxic gait: Secondary | ICD-10-CM | POA: Diagnosis not present

## 2021-11-19 DIAGNOSIS — M6281 Muscle weakness (generalized): Secondary | ICD-10-CM | POA: Diagnosis not present

## 2021-11-19 DIAGNOSIS — R2681 Unsteadiness on feet: Secondary | ICD-10-CM | POA: Diagnosis not present

## 2021-12-04 ENCOUNTER — Ambulatory Visit (INDEPENDENT_AMBULATORY_CARE_PROVIDER_SITE_OTHER): Payer: PPO

## 2021-12-04 DIAGNOSIS — I441 Atrioventricular block, second degree: Secondary | ICD-10-CM

## 2021-12-04 LAB — CUP PACEART REMOTE DEVICE CHECK
Battery Remaining Longevity: 117 mo
Battery Voltage: 3.04 V
Brady Statistic AP VP Percent: 11.58 %
Brady Statistic AP VS Percent: 0 %
Brady Statistic AS VP Percent: 88.42 %
Brady Statistic AS VS Percent: 0 %
Brady Statistic RA Percent Paced: 11.59 %
Brady Statistic RV Percent Paced: 99.99 %
Date Time Interrogation Session: 20230816233351
Implantable Lead Implant Date: 20100217
Implantable Lead Implant Date: 20100217
Implantable Lead Location: 753859
Implantable Lead Location: 753860
Implantable Lead Model: 5076
Implantable Lead Model: 5076
Implantable Pulse Generator Implant Date: 20220816
Lead Channel Impedance Value: 361 Ohm
Lead Channel Impedance Value: 361 Ohm
Lead Channel Impedance Value: 418 Ohm
Lead Channel Impedance Value: 437 Ohm
Lead Channel Pacing Threshold Amplitude: 0.375 V
Lead Channel Pacing Threshold Amplitude: 1 V
Lead Channel Pacing Threshold Pulse Width: 0.4 ms
Lead Channel Pacing Threshold Pulse Width: 0.4 ms
Lead Channel Sensing Intrinsic Amplitude: 2 mV
Lead Channel Sensing Intrinsic Amplitude: 2 mV
Lead Channel Sensing Intrinsic Amplitude: 20.625 mV
Lead Channel Sensing Intrinsic Amplitude: 20.625 mV
Lead Channel Setting Pacing Amplitude: 2 V
Lead Channel Setting Pacing Amplitude: 2.5 V
Lead Channel Setting Pacing Pulse Width: 0.4 ms
Lead Channel Setting Sensing Sensitivity: 4 mV

## 2021-12-08 DIAGNOSIS — R260 Ataxic gait: Secondary | ICD-10-CM | POA: Diagnosis not present

## 2021-12-08 DIAGNOSIS — R2681 Unsteadiness on feet: Secondary | ICD-10-CM | POA: Diagnosis not present

## 2021-12-08 DIAGNOSIS — M6281 Muscle weakness (generalized): Secondary | ICD-10-CM | POA: Diagnosis not present

## 2021-12-10 ENCOUNTER — Encounter (INDEPENDENT_AMBULATORY_CARE_PROVIDER_SITE_OTHER): Payer: PPO | Admitting: Ophthalmology

## 2021-12-10 DIAGNOSIS — H35372 Puckering of macula, left eye: Secondary | ICD-10-CM

## 2021-12-10 DIAGNOSIS — H35033 Hypertensive retinopathy, bilateral: Secondary | ICD-10-CM

## 2021-12-10 DIAGNOSIS — I10 Essential (primary) hypertension: Secondary | ICD-10-CM | POA: Diagnosis not present

## 2021-12-10 DIAGNOSIS — H43813 Vitreous degeneration, bilateral: Secondary | ICD-10-CM | POA: Diagnosis not present

## 2021-12-10 DIAGNOSIS — H353231 Exudative age-related macular degeneration, bilateral, with active choroidal neovascularization: Secondary | ICD-10-CM

## 2021-12-11 DIAGNOSIS — R2681 Unsteadiness on feet: Secondary | ICD-10-CM | POA: Diagnosis not present

## 2021-12-11 DIAGNOSIS — R260 Ataxic gait: Secondary | ICD-10-CM | POA: Diagnosis not present

## 2021-12-11 DIAGNOSIS — M6281 Muscle weakness (generalized): Secondary | ICD-10-CM | POA: Diagnosis not present

## 2021-12-16 DIAGNOSIS — R2681 Unsteadiness on feet: Secondary | ICD-10-CM | POA: Diagnosis not present

## 2021-12-16 DIAGNOSIS — R260 Ataxic gait: Secondary | ICD-10-CM | POA: Diagnosis not present

## 2021-12-16 DIAGNOSIS — M6281 Muscle weakness (generalized): Secondary | ICD-10-CM | POA: Diagnosis not present

## 2021-12-19 DIAGNOSIS — R2681 Unsteadiness on feet: Secondary | ICD-10-CM | POA: Diagnosis not present

## 2021-12-19 DIAGNOSIS — M6281 Muscle weakness (generalized): Secondary | ICD-10-CM | POA: Diagnosis not present

## 2021-12-19 DIAGNOSIS — R260 Ataxic gait: Secondary | ICD-10-CM | POA: Diagnosis not present

## 2021-12-23 DIAGNOSIS — R2681 Unsteadiness on feet: Secondary | ICD-10-CM | POA: Diagnosis not present

## 2021-12-23 DIAGNOSIS — M6281 Muscle weakness (generalized): Secondary | ICD-10-CM | POA: Diagnosis not present

## 2021-12-23 DIAGNOSIS — R260 Ataxic gait: Secondary | ICD-10-CM | POA: Diagnosis not present

## 2021-12-25 DIAGNOSIS — R260 Ataxic gait: Secondary | ICD-10-CM | POA: Diagnosis not present

## 2021-12-25 DIAGNOSIS — M6281 Muscle weakness (generalized): Secondary | ICD-10-CM | POA: Diagnosis not present

## 2021-12-25 DIAGNOSIS — R2681 Unsteadiness on feet: Secondary | ICD-10-CM | POA: Diagnosis not present

## 2021-12-30 DIAGNOSIS — R2681 Unsteadiness on feet: Secondary | ICD-10-CM | POA: Diagnosis not present

## 2021-12-30 DIAGNOSIS — R260 Ataxic gait: Secondary | ICD-10-CM | POA: Diagnosis not present

## 2021-12-30 DIAGNOSIS — M6281 Muscle weakness (generalized): Secondary | ICD-10-CM | POA: Diagnosis not present

## 2021-12-30 NOTE — Progress Notes (Signed)
Remote pacemaker transmission.   

## 2022-01-01 DIAGNOSIS — R2681 Unsteadiness on feet: Secondary | ICD-10-CM | POA: Diagnosis not present

## 2022-01-01 DIAGNOSIS — M6281 Muscle weakness (generalized): Secondary | ICD-10-CM | POA: Diagnosis not present

## 2022-01-01 DIAGNOSIS — R260 Ataxic gait: Secondary | ICD-10-CM | POA: Diagnosis not present

## 2022-01-05 DIAGNOSIS — R2681 Unsteadiness on feet: Secondary | ICD-10-CM | POA: Diagnosis not present

## 2022-01-05 DIAGNOSIS — M6281 Muscle weakness (generalized): Secondary | ICD-10-CM | POA: Diagnosis not present

## 2022-01-05 DIAGNOSIS — R260 Ataxic gait: Secondary | ICD-10-CM | POA: Diagnosis not present

## 2022-01-06 ENCOUNTER — Telehealth: Payer: Self-pay | Admitting: Internal Medicine

## 2022-01-06 DIAGNOSIS — G4733 Obstructive sleep apnea (adult) (pediatric): Secondary | ICD-10-CM

## 2022-01-06 NOTE — Telephone Encounter (Signed)
Patient called to inform the doctor that the motor in her cpap machine is going out.  She contacted the [facility] and they told her that she will need a new machine soon.  They also said they will contact the doctor to let him know as well.  Please contact the patient to discuss further.  CB# [phone]

## 2022-01-06 NOTE — Telephone Encounter (Signed)
Please replace old CPAP machine, auto 7-15, mask of choice, humidifier, supplies, AirView/ card

## 2022-01-06 NOTE — Telephone Encounter (Signed)
Called patient and informed her that I was sending in new cpap order into [facility]. She verbalized understanding. Nothing further needed

## 2022-01-06 NOTE — Telephone Encounter (Signed)
Called patient and she states that her machine is needing to be replaced. She contacted [facility] and they state they she needs an order for a new machine  Previous orders were auto 7-15  Sir are you ok with [place] sending in new order for cpap with previous settings.  Please advise

## 2022-01-07 ENCOUNTER — Encounter (INDEPENDENT_AMBULATORY_CARE_PROVIDER_SITE_OTHER): Payer: PPO | Admitting: Ophthalmology

## 2022-01-07 DIAGNOSIS — H35372 Puckering of macula, left eye: Secondary | ICD-10-CM

## 2022-01-07 DIAGNOSIS — H35033 Hypertensive retinopathy, bilateral: Secondary | ICD-10-CM

## 2022-01-07 DIAGNOSIS — H43813 Vitreous degeneration, bilateral: Secondary | ICD-10-CM | POA: Diagnosis not present

## 2022-01-07 DIAGNOSIS — I10 Essential (primary) hypertension: Secondary | ICD-10-CM | POA: Diagnosis not present

## 2022-01-07 DIAGNOSIS — H353231 Exudative age-related macular degeneration, bilateral, with active choroidal neovascularization: Secondary | ICD-10-CM | POA: Diagnosis not present

## 2022-01-12 DIAGNOSIS — R260 Ataxic gait: Secondary | ICD-10-CM | POA: Diagnosis not present

## 2022-01-12 DIAGNOSIS — R2681 Unsteadiness on feet: Secondary | ICD-10-CM | POA: Diagnosis not present

## 2022-01-12 DIAGNOSIS — M6281 Muscle weakness (generalized): Secondary | ICD-10-CM | POA: Diagnosis not present

## 2022-01-14 DIAGNOSIS — D2271 Melanocytic nevi of right lower limb, including hip: Secondary | ICD-10-CM | POA: Diagnosis not present

## 2022-01-14 DIAGNOSIS — R260 Ataxic gait: Secondary | ICD-10-CM | POA: Diagnosis not present

## 2022-01-14 DIAGNOSIS — D1801 Hemangioma of skin and subcutaneous tissue: Secondary | ICD-10-CM | POA: Diagnosis not present

## 2022-01-14 DIAGNOSIS — M6281 Muscle weakness (generalized): Secondary | ICD-10-CM | POA: Diagnosis not present

## 2022-01-14 DIAGNOSIS — G4733 Obstructive sleep apnea (adult) (pediatric): Secondary | ICD-10-CM | POA: Diagnosis not present

## 2022-01-14 DIAGNOSIS — R2681 Unsteadiness on feet: Secondary | ICD-10-CM | POA: Diagnosis not present

## 2022-01-14 DIAGNOSIS — L570 Actinic keratosis: Secondary | ICD-10-CM | POA: Diagnosis not present

## 2022-01-14 DIAGNOSIS — L821 Other seborrheic keratosis: Secondary | ICD-10-CM | POA: Diagnosis not present

## 2022-01-14 DIAGNOSIS — C44319 Basal cell carcinoma of skin of other parts of face: Secondary | ICD-10-CM | POA: Diagnosis not present

## 2022-01-14 DIAGNOSIS — Z85828 Personal history of other malignant neoplasm of skin: Secondary | ICD-10-CM | POA: Diagnosis not present

## 2022-01-14 DIAGNOSIS — L814 Other melanin hyperpigmentation: Secondary | ICD-10-CM | POA: Diagnosis not present

## 2022-01-20 DIAGNOSIS — M6281 Muscle weakness (generalized): Secondary | ICD-10-CM | POA: Diagnosis not present

## 2022-01-20 DIAGNOSIS — R2681 Unsteadiness on feet: Secondary | ICD-10-CM | POA: Diagnosis not present

## 2022-01-20 DIAGNOSIS — R260 Ataxic gait: Secondary | ICD-10-CM | POA: Diagnosis not present

## 2022-01-21 ENCOUNTER — Other Ambulatory Visit: Payer: Self-pay | Admitting: Internal Medicine

## 2022-01-21 DIAGNOSIS — Z1231 Encounter for screening mammogram for malignant neoplasm of breast: Secondary | ICD-10-CM

## 2022-01-22 DIAGNOSIS — R2681 Unsteadiness on feet: Secondary | ICD-10-CM | POA: Diagnosis not present

## 2022-01-22 DIAGNOSIS — R260 Ataxic gait: Secondary | ICD-10-CM | POA: Diagnosis not present

## 2022-01-22 DIAGNOSIS — M6281 Muscle weakness (generalized): Secondary | ICD-10-CM | POA: Diagnosis not present

## 2022-01-24 DIAGNOSIS — Z23 Encounter for immunization: Secondary | ICD-10-CM | POA: Diagnosis not present

## 2022-01-27 DIAGNOSIS — M6281 Muscle weakness (generalized): Secondary | ICD-10-CM | POA: Diagnosis not present

## 2022-01-27 DIAGNOSIS — R2681 Unsteadiness on feet: Secondary | ICD-10-CM | POA: Diagnosis not present

## 2022-01-27 DIAGNOSIS — R260 Ataxic gait: Secondary | ICD-10-CM | POA: Diagnosis not present

## 2022-02-03 DIAGNOSIS — M6281 Muscle weakness (generalized): Secondary | ICD-10-CM | POA: Diagnosis not present

## 2022-02-03 DIAGNOSIS — G4733 Obstructive sleep apnea (adult) (pediatric): Secondary | ICD-10-CM | POA: Diagnosis not present

## 2022-02-03 DIAGNOSIS — R260 Ataxic gait: Secondary | ICD-10-CM | POA: Diagnosis not present

## 2022-02-03 DIAGNOSIS — R2681 Unsteadiness on feet: Secondary | ICD-10-CM | POA: Diagnosis not present

## 2022-02-04 ENCOUNTER — Encounter (INDEPENDENT_AMBULATORY_CARE_PROVIDER_SITE_OTHER): Payer: PPO | Admitting: Ophthalmology

## 2022-02-04 DIAGNOSIS — I10 Essential (primary) hypertension: Secondary | ICD-10-CM | POA: Diagnosis not present

## 2022-02-04 DIAGNOSIS — H353231 Exudative age-related macular degeneration, bilateral, with active choroidal neovascularization: Secondary | ICD-10-CM

## 2022-02-04 DIAGNOSIS — H35033 Hypertensive retinopathy, bilateral: Secondary | ICD-10-CM | POA: Diagnosis not present

## 2022-02-04 DIAGNOSIS — H35372 Puckering of macula, left eye: Secondary | ICD-10-CM

## 2022-02-04 DIAGNOSIS — H43813 Vitreous degeneration, bilateral: Secondary | ICD-10-CM | POA: Diagnosis not present

## 2022-02-05 DIAGNOSIS — R2681 Unsteadiness on feet: Secondary | ICD-10-CM | POA: Diagnosis not present

## 2022-02-05 DIAGNOSIS — M6281 Muscle weakness (generalized): Secondary | ICD-10-CM | POA: Diagnosis not present

## 2022-02-05 DIAGNOSIS — R260 Ataxic gait: Secondary | ICD-10-CM | POA: Diagnosis not present

## 2022-02-10 DIAGNOSIS — R260 Ataxic gait: Secondary | ICD-10-CM | POA: Diagnosis not present

## 2022-02-10 DIAGNOSIS — R2681 Unsteadiness on feet: Secondary | ICD-10-CM | POA: Diagnosis not present

## 2022-02-10 DIAGNOSIS — M6281 Muscle weakness (generalized): Secondary | ICD-10-CM | POA: Diagnosis not present

## 2022-02-12 DIAGNOSIS — M6281 Muscle weakness (generalized): Secondary | ICD-10-CM | POA: Diagnosis not present

## 2022-02-12 DIAGNOSIS — R260 Ataxic gait: Secondary | ICD-10-CM | POA: Diagnosis not present

## 2022-02-12 DIAGNOSIS — R2681 Unsteadiness on feet: Secondary | ICD-10-CM | POA: Diagnosis not present

## 2022-02-13 ENCOUNTER — Ambulatory Visit (HOSPITAL_BASED_OUTPATIENT_CLINIC_OR_DEPARTMENT_OTHER): Payer: PPO | Admitting: Cardiology

## 2022-02-13 DIAGNOSIS — G4733 Obstructive sleep apnea (adult) (pediatric): Secondary | ICD-10-CM | POA: Diagnosis not present

## 2022-02-16 ENCOUNTER — Telehealth: Payer: Self-pay | Admitting: Cardiology

## 2022-02-16 ENCOUNTER — Ambulatory Visit (HOSPITAL_BASED_OUTPATIENT_CLINIC_OR_DEPARTMENT_OTHER): Payer: PPO | Admitting: Cardiology

## 2022-02-16 NOTE — Telephone Encounter (Signed)
Spoke with patient and moved her appointment to Friday, she apologized for getting times confused on today's appointment

## 2022-02-16 NOTE — Progress Notes (Incomplete)
Cardiology Office Note:    Date:  02/16/2022   ID:  [name], DOB 08/03/1934, MRN [id number]  PCP:  Donnajean Lopes, MD  Cardiologist:  Buford Dresser, MD EP: Dr. Rayann Heman  Referring MD: Donnajean Lopes, MD   CC: new patient evaluation for shortness of breath  History of Present Illness:    [name] is a 87 y.o. female with a hx of hypertension, hyperlipidemia, paroxysmal atrial fibrillation, OSA, Second degree type II AV block s/p PPM who is seen as a new consult at the request of Donnajean Lopes, MD for the evaluation and management of shortness of breath.  She has been seen by EP but has not been followed by general cardiology since 2013 (Dr. Mare Ferrari). Last seen in EP clinic 11/26/20, noted ERI on device with asynchronous pacing at 65 bpm, s/p gen change 12/03/20 by Dr. Rayann Heman. Noted at EP clinic to have more shortness of breath and peripheral edema with asynchronous pacing.  Note from 10/29/20 From Hope Budds, [place] reviewed. Concern was for worsening shortness of breath and fatigue.  Cardiovascular risk factors: Prior clinical ASCVD: None Comorbid conditions: hypertension, hyperlipidemia Tobacco use history: Never smoker Family history: Father died at 38 yo, and had multiple heart attacks. Exercise level: This summer, she could only walk 10 feet before needing to rest. She has been resting and recovering since her generator change, and has not participated in much formal exercise. She is limited by her baseline shortness of breath and hip pain. She does walk her dog regularly, which includes walking down the driveway and around the court. In her home she has one step down to the laundry room, and uses a handle to steady herself. Current diet: Previously using fish oil supplement.  Her breathing improved significantly almost immediately after her generator change. She still was having some shortness of breath that had been about the same since  before her procedure, including this morning as she was hurrying.  In 03/2020 she had pneumonia. Since then she has had an intermittent cough, worse when she goes to bed at night.   Since her appointment with Dr. Chalmers Cater she had been taking Lasix, which had improved her LE edema. At this time she was taking Lasix every other day.  She was unaware of having any episodes or history of atrial fibrillation.  For back pain management, she had been using Aleve every 2-3 days. She was taking Omeprazole for heartburn. Also, since beginning metoprolol she was experiencing tremors.  Today:  She denies any palpitations, chest pain, shortness of breath, or peripheral edema. No lightheadedness, headaches, syncope, orthopnea, or PND.  (+)  Past Medical History:  Diagnosis Date   Abnormal heart rhythm    Anemia    hx of not recent    Atypical chest pain    Cardiomegaly    Chronic headaches    Chronic kidney disease    Classic migraine with aura 12/17/2014   Depression    Dysrhythmia    HX OF MOBITZ 2 AV BLOCK AND FUNCTIONING PACEMAKER -DR. JAMES ALLRED FOLLOWS PT'S PACEMAKER CARE   GERD (gastroesophageal reflux disease)    Headache(784.0)    PT STATES HER CHRONIC H/A'S HAVE RESOLVED   Hypercholesteremia    Hyperlipidemia    Hypertension    Obstructive sleep apnea on CPAP    Osteoarthritis    Pacemaker    Recurrent upper respiratory infection (URI)    MARCH 2013 DX WITH BRONCHITIS--PT STATES RESOLVED--DOES HAVE SOME  ALLERGIES AT PRESENT   Second degree Mobitz II AV block    s/p PPM by Dr Verlon Setting 2010   Sleep apnea    on CPAP-uses [facility]   Urinary incontinence     Past Surgical History:  Procedure Laterality Date   CARPAL TUNNEL RELEASE Bilateral    CATARACT EXTRACTION Left    CESAREAN SECTION     FOOT SURGERY     HAND SURGERY     KNEE CLOSED REDUCTION  11/30/2011   Procedure: CLOSED MANIPULATION KNEE;  Surgeon: Gearlean Alf, MD;  Location: [facility];  Service: Orthopedics;   Laterality: Left;   KNEE SURGERY     Arthroscopic surg   PACEMAKER INSERTION  2010   dual-chamber (MDT) implanted by Dr Verlon Setting   [facility] GENERATOR CHANGEOUT N/A 12/03/2020   Procedure: PPM GENERATOR CHANGEOUT;  Surgeon: [name], MD;  Location: [place] [facility];  Service: Cardiovascular;  Laterality: N/A;   TOTAL KNEE ARTHROPLASTY Bilateral    right   TOTAL KNEE ARTHROPLASTY  08/24/2011   Procedure: TOTAL KNEE ARTHROPLASTY;  Surgeon: Gearlean Alf, MD;  Location: [facility];  Service: Orthopedics;  Laterality: Left;   TUBAL LIGATION  1968    Current Medications: Current Outpatient Medications on File Prior to Visit  Medication Sig   amLODipine (NORVASC) 5 MG tablet Take 5 mg by mouth at bedtime.   azithromycin (ZITHROMAX) 250 MG tablet Take by mouth as directed.   clindamycin (CLEOCIN) 300 MG capsule Take 600 mg by mouth See admin instructions. Take 600 mg before dental work then 600 mg after the dental work   furosemide (LASIX) 20 MG tablet TAKE 1 TABLET BY MOUTH EVERY DAY AS NEEDED   metoprolol succinate (TOPROL-XL) 50 MG 24 hr tablet Take 50 mg by mouth daily.   mirabegron ER (MYRBETRIQ) 25 MG TB24 tablet Take 25 mg by mouth daily.   Multiple Vitamins-Minerals (PRESERVISION AREDS 2) CAPS Take 1 capsule by mouth 2 (two) times daily.   omeprazole (PRILOSEC) 20 MG capsule Take 20 mg by mouth every morning.   simvastatin (ZOCOR) 40 MG tablet Take 40 mg by mouth at bedtime.   telmisartan-hydrochlorothiazide (MICARDIS HCT) 80-12.5 MG per tablet Take 1 tablet by mouth daily with breakfast.   No current facility-administered medications on file prior to visit.     Allergies:   Ace inhibitors, Amoxicillin, and Codeine   Social History   Tobacco Use   Smoking status: Never   Smokeless tobacco: Never  Vaping Use   Vaping Use: Never used  Substance Use Topics   Alcohol use: No   Drug use: No    Family History: family history includes Allergies in her father; Breast cancer in her  mother; Dementia in her mother; Diabetes in her mother; Emphysema in her father; Heart attack in her father; Heart disease in her father and mother; Hypertension in her father and mother; Macular degeneration in her father and sister; Migraines in her daughter; Osteoarthritis in her mother.  ROS:   Please see the history of present illness.  Additional pertinent ROS: Constitutional: Negative for chills, fever, night sweats, unintentional weight loss  HENT: Negative for ear pain and hearing loss.   Eyes: Negative for loss of vision and eye pain.  Respiratory: Positive for Shortness of breath, cough. Negative for sputum, wheezing.   Cardiovascular: See HPI. Gastrointestinal: Negative for abdominal pain, melena, and hematochezia.  Genitourinary: Negative for dysuria and hematuria.  Musculoskeletal: Positive for hip and back pain. Negative for falls and  myalgias.  Skin: Negative for itching and rash.  Neurological: Negative for focal weakness, focal sensory changes and loss of consciousness.  Endo/Heme/Allergies: Does not bruise/bleed easily.     EKGs/Labs/Other Studies Reviewed:    The following studies were reviewed today:  Echo 01/06/2021: 1. Left ventricular ejection fraction, by estimation, is 65 to 70%. The  left ventricle has normal function. The left ventricle has no regional  wall motion abnormalities. Left ventricular diastolic parameters are  consistent with Grade I diastolic  dysfunction (impaired relaxation).   2. Right ventricular systolic function is normal. The right ventricular  size is normal.   3. The mitral valve is grossly normal. Trivial mitral valve  regurgitation.   4. The aortic valve is normal in structure. Aortic valve regurgitation is  not visualized. No aortic stenosis is present.   5. The inferior vena cava is normal in size with greater than 50%  respiratory variability, suggesting right atrial pressure of 3 mmHg.   Comparison(s): The left ventricle has  hyperdynamic systolic function, with  an ejection fraction of >65%.    Echo 12/29/2018: 1. The left ventricle has hyperdynamic systolic function, with an  ejection fraction of >65%. The cavity size was normal. Left ventricular  diastolic Doppler parameters are consistent with impaired relaxation.   2. The right ventricle has normal systolic function. The cavity was  normal. There is no increase in right ventricular wall thickness.   3. Left atrial size was mildly dilated.   4. The mitral valve is abnormal. Mild thickening of the mitral valve  leaflet. There is mild to moderate mitral annular calcification present.   5. Tricuspid valve regurgitation is mild-moderate.   6. The aortic valve is tricuspid. Mild thickening of the aortic valve.  Mild calcification of the aortic valve. Aortic valve regurgitation is mild  by color flow Doppler.   EKG:  EKG is personally reviewed.   02/16/22: Sinus ***. Rate *** bpm. 12/30/2020: AV dual paced rhythm at 80 bpm  Recent Labs: No results found for requested labs within last 365 days.  Recent Lipid Panel    Component Value Date/Time   CHOL  06/04/2008 1430    171        ATP III CLASSIFICATION:  <200     mg/dL   Desirable  200-239  mg/dL   Borderline High  >=240    mg/dL   High          TRIG 144 06/04/2008 1430   HDL 47 06/04/2008 1430   CHOLHDL 3.6 06/04/2008 1430   VLDL 29 06/04/2008 1430   LDLCALC  06/04/2008 1430    95        Total Cholesterol/HDL:CHD Risk Coronary Heart Disease Risk Table                     Men   Women  1/2 Average Risk   3.4   3.3  Average Risk       5.0   4.4  2 X Average Risk   9.6   7.1  3 X Average Risk  23.4   11.0        Use the calculated Patient Ratio above and the CHD Risk Table to determine the patient's CHD Risk.        ATP III CLASSIFICATION (LDL):  <100     mg/dL   Optimal  100-129  mg/dL   Near or Above  Optimal  130-159  mg/dL   Borderline  160-189  mg/dL   High  >190      mg/dL   Very High    Physical Exam:    VS:  There were no vitals taken for this visit.    Wt Readings from Last 3 Encounters:  06/16/21 202 lb 3.2 oz (91.7 kg)  03/07/21 203 lb 3.2 oz (92.2 kg)  12/30/20 205 lb 4.8 oz (93.1 kg)    GEN: Well nourished, well developed in no acute distress HEENT: Normal, moist mucous membranes NECK: No JVD CARDIAC: regular rhythm, normal S1 and S2, no rubs or gallops. No murmur. VASCULAR: Radial and DP pulses 2+ bilaterally. No carotid bruits RESPIRATORY:  ***Coarse sounds in upper right lung without rales, wheezing or rhonchi  ABDOMEN: Soft, non-tender, non-distended MUSCULOSKELETAL:  Ambulates independently SKIN: Warm and dry, no edema NEUROLOGIC:  Alert and oriented x 3. No focal neuro deficits noted. PSYCHIATRIC:  Normal affect    ASSESSMENT:    No diagnosis found.  PLAN:    Shortness of breath Intermittent LE edema -much improved with gen change, but daughter still notes that she sounds short of breath even on the phone -appears euvolemic today -last echo 2020 largely unremarkable -discussed heart failure vs noncardiac causes of SOB -will repeat echo to exclude structural changes that may be leading to symptoms -counseled on red flag warning signs that need immediate medical attention  Second degree AV block type II Permanent DC-PPM Frequent PVCs -followed by Dr. Rayann Heman -s/p gen change for PPM, symptoms improved -on metoprolol for PVCs  Hypertension: Intermittent LE edema -on amlodipine, telmisartan-HCTZ -she has cut back lasix dose to every few days, notices more LE edema with this. Mild, not severe. Discussed do not want on both HCTZ daily and frequent furosemide. She will cut back to only rare as needed on the furosemide. Discussed compression, elevation of legs, which she states helps with edema as well.  Cardiac risk counseling and prevention recommendations: -recommend heart healthy/Mediterranean diet, with whole grains,  fruits, vegetable, fish, lean meats, nuts, and olive oil. Limit salt. -recommend moderate walking, 3-5 times/week for 30-50 minutes each session. Aim for at least 150 minutes.week. Goal should be pace of 3 miles/hours, or walking 1.5 miles in 30 minutes -recommend avoidance of tobacco products. Avoid excess alcohol. -ASCVD risk score: The ASCVD Risk score (Arnett DK, et al., 2019) failed to calculate for the following reasons:   The 2019 ASCVD risk score is only valid for ages 62 to 59    Plan for follow up: 1 year or sooner as needed.***  Buford Dresser, MD, PhD, [facility]    Medication Adjustments/Labs and Tests Ordered: Current medicines are reviewed at length with the patient today.  Concerns regarding medicines are outlined above.  No orders of the defined types were placed in this encounter.   No orders of the defined types were placed in this encounter.   There are no Patient Instructions on file for this visit.    I,Breanna Adamick,acting as a Education administrator for [employer], MD.,have documented all relevant documentation on the behalf of Buford Dresser, MD,as directed by  Buford Dresser, MD while in the presence of Buford Dresser, MD.   I, Bubba Hales Adamick, have reviewed all documentation for this visit. The documentation on 02/16/22 for the exam, diagnosis, procedures, and orders are all accurate and complete.   Signed, Buford Dresser, MD PhD 02/16/2022 8:12 AM    [facility]

## 2022-02-16 NOTE — Telephone Encounter (Signed)
Patient states she is returning a call, but does not know who called or what it was regarding.

## 2022-02-17 DIAGNOSIS — R260 Ataxic gait: Secondary | ICD-10-CM | POA: Diagnosis not present

## 2022-02-17 DIAGNOSIS — R2681 Unsteadiness on feet: Secondary | ICD-10-CM | POA: Diagnosis not present

## 2022-02-17 DIAGNOSIS — M6281 Muscle weakness (generalized): Secondary | ICD-10-CM | POA: Diagnosis not present

## 2022-02-20 ENCOUNTER — Ambulatory Visit (INDEPENDENT_AMBULATORY_CARE_PROVIDER_SITE_OTHER): Payer: PPO | Admitting: Cardiology

## 2022-02-20 ENCOUNTER — Encounter (HOSPITAL_BASED_OUTPATIENT_CLINIC_OR_DEPARTMENT_OTHER): Payer: Self-pay | Admitting: Cardiology

## 2022-02-20 VITALS — BP 110/68 | HR 87 | Ht 60.0 in | Wt 213.1 lb

## 2022-02-20 DIAGNOSIS — R0602 Shortness of breath: Secondary | ICD-10-CM | POA: Diagnosis not present

## 2022-02-20 DIAGNOSIS — I441 Atrioventricular block, second degree: Secondary | ICD-10-CM

## 2022-02-20 DIAGNOSIS — R609 Edema, unspecified: Secondary | ICD-10-CM | POA: Diagnosis not present

## 2022-02-20 DIAGNOSIS — Z950 Presence of cardiac pacemaker: Secondary | ICD-10-CM | POA: Diagnosis not present

## 2022-02-20 DIAGNOSIS — G4733 Obstructive sleep apnea (adult) (pediatric): Secondary | ICD-10-CM | POA: Diagnosis not present

## 2022-02-20 DIAGNOSIS — R5382 Chronic fatigue, unspecified: Secondary | ICD-10-CM

## 2022-02-20 NOTE — Progress Notes (Signed)
Cardiology Office Note:    Date:  02/20/2022   ID:  [name], DOB 02-12-1935, MRN [id number]  PCP:  Tracey Lopes, MD  Cardiologist:  Buford Dresser, MD EP: Dr. Rayann Heman  Referring MD: Tracey Lopes, MD   CC: follow up  History of Present Illness:    [name] is a 87 y.o. female with a hx of hypertension, hyperlipidemia, paroxysmal atrial fibrillation, OSA, Second degree type II AV block s/p PPM who is seen for follow up of shortness of breath. She was initially seen as a new consult on 12/30/2020 at the request of Tracey Lopes, MD for the evaluation and management of shortness of breath.  Cardiac history: Noted at EP clinic to have more shortness of breath and peripheral edema with asynchronous pacing.  Family history: Father died at 4 yo, and had multiple heart attacks.  Today, she states that her shortness of breath has worsened recently. She states that people have told her that she sounds short of breath over the phone. However, she does not always notice this shortness of breath when others point it out to her.   She has been attending physical therapy to work on her leg weakness without any difficulty breathing. She is performing her ADLs without breathing limitations. However, she reports feeling out of breath with getting up to the exam table today. This resolved quickly with resting and taking deep breaths. She notes that she was anxious and in a hurry to get to the office today. She denies any changes in her breathing with weather changes.  She reports an accompanying cough but denies feeling sick otherwise.  She notes accompanying fatigue and often does not have the energy to do activities that she would like to do. She denies any change in appetite or decreased PO intake. She notes that she is relatively sedentary but would like to lose some weight.  She reports right ankle swelling but denies any LLE swelling. She has been using Lasix prn  if her ankle swelling occurs.  She recently got a new CPAP machine and hs started using humidity with it with improvement of previous nosebleeds. She is taking Ambien every night which is helping with her sleep. Prior to this she was waking up every night around 2-3AM and having difficulty going back to sleep.  The patient denies chest pain, chest pressure, palpitations, PND, or orthopnea. Denies fever, chills, nausea, or vomiting. Denies syncope. Denies dizziness or lightheadedness.    Past Medical History:  Diagnosis Date   Abnormal heart rhythm    Anemia    hx of not recent    Atypical chest pain    Cardiomegaly    Chronic headaches    Chronic kidney disease    Classic migraine with aura 12/17/2014   Depression    Dysrhythmia    HX OF MOBITZ 2 AV BLOCK AND FUNCTIONING PACEMAKER -DR. JAMES ALLRED FOLLOWS PT'S PACEMAKER CARE   GERD (gastroesophageal reflux disease)    Headache(784.0)    PT STATES HER CHRONIC H/A'S HAVE RESOLVED   Hypercholesteremia    Hyperlipidemia    Hypertension    Obstructive sleep apnea on CPAP    Osteoarthritis    Pacemaker    Recurrent upper respiratory infection (URI)    MARCH 2013 DX WITH BRONCHITIS--PT STATES RESOLVED--DOES HAVE SOME ALLERGIES AT PRESENT   Second degree Mobitz II AV block    s/p PPM by Dr Verlon Setting 2010   Sleep apnea    on  CPAP-uses [facility]   Urinary incontinence     Past Surgical History:  Procedure Laterality Date   CARPAL TUNNEL RELEASE Bilateral    CATARACT EXTRACTION Left    CESAREAN SECTION     FOOT SURGERY     HAND SURGERY     KNEE CLOSED REDUCTION  11/30/2011   Procedure: CLOSED MANIPULATION KNEE;  Surgeon: Gearlean Alf, MD;  Location: [facility];  Service: Orthopedics;  Laterality: Left;   KNEE SURGERY     Arthroscopic surg   PACEMAKER INSERTION  2010   dual-chamber (MDT) implanted by Dr Verlon Setting   [facility] GENERATOR CHANGEOUT N/A 12/03/2020   Procedure: PPM GENERATOR CHANGEOUT;  Surgeon: [name], MD;  Location: [facility];  Service: Cardiovascular;  Laterality: N/A;   TOTAL KNEE ARTHROPLASTY Bilateral    right   TOTAL KNEE ARTHROPLASTY  08/24/2011   Procedure: TOTAL KNEE ARTHROPLASTY;  Surgeon: Gearlean Alf, MD;  Location: [facility];  Service: Orthopedics;  Laterality: Left;   TUBAL LIGATION  1968    Current Medications: Current Outpatient Medications on File Prior to Visit  Medication Sig   amLODipine (NORVASC) 5 MG tablet Take 5 mg by mouth at bedtime.   clindamycin (CLEOCIN) 300 MG capsule Take 600 mg by mouth See admin instructions. Take 600 mg before dental work then 600 mg after the dental work   furosemide (LASIX) 20 MG tablet TAKE 1 TABLET BY MOUTH EVERY DAY AS NEEDED   metoprolol succinate (TOPROL-XL) 50 MG 24 hr tablet Take 50 mg by mouth daily.   mirabegron ER (MYRBETRIQ) 25 MG TB24 tablet Take 25 mg by mouth daily.   Multiple Vitamins-Minerals (PRESERVISION AREDS 2) CAPS Take 1 capsule by mouth 2 (two) times daily.   omeprazole (PRILOSEC) 20 MG capsule Take 20 mg by mouth every morning.   simvastatin (ZOCOR) 40 MG tablet Take 40 mg by mouth at bedtime.   telmisartan-hydrochlorothiazide (MICARDIS HCT) 80-12.5 MG per tablet Take 1 tablet by mouth daily with breakfast.   No current facility-administered medications on file prior to visit.     Allergies:   Ace inhibitors, Amoxicillin, and Codeine   Social History   Tobacco Use   Smoking status: Never   Smokeless tobacco: Never  Vaping Use   Vaping Use: Never used  Substance Use Topics   Alcohol use: No   Drug use: No    Family History: family history includes Allergies in her father; Breast cancer in her mother; Dementia in her mother; Diabetes in her mother; Emphysema in her father; Heart attack in her father; Heart disease in her father and mother; Hypertension in her father and mother; Macular degeneration in her father and sister; Migraines in her daughter; Osteoarthritis in her mother.  ROS:   Please see the history  of present illness.   (+) Shortness of breath (+) Cough Additional pertinent ROS negative except as documented.   EKGs/Labs/Other Studies Reviewed:    The following studies were reviewed today:  Echo 01/06/2021:  1. Left ventricular ejection fraction, by estimation, is 65 to 70%. The  left ventricle has normal function. The left ventricle has no regional  wall motion abnormalities. Left ventricular diastolic parameters are  consistent with Grade I diastolic  dysfunction (impaired relaxation).   2. Right ventricular systolic function is normal. The right ventricular  size is normal.   3. The mitral valve is grossly normal. Trivial mitral valve  regurgitation.   4. The aortic valve is normal in structure. Aortic valve  regurgitation is  not visualized. No aortic stenosis is present.   5. The inferior vena cava is normal in size with greater than 50%  respiratory variability, suggesting right atrial pressure of 3 mmHg.   Echo 12/29/2018: 1. The left ventricle has hyperdynamic systolic function, with an  ejection fraction of >65%. The cavity size was normal. Left ventricular  diastolic Doppler parameters are consistent with impaired relaxation.   2. The right ventricle has normal systolic function. The cavity was  normal. There is no increase in right ventricular wall thickness.   3. Left atrial size was mildly dilated.   4. The mitral valve is abnormal. Mild thickening of the mitral valve  leaflet. There is mild to moderate mitral annular calcification present.   5. Tricuspid valve regurgitation is mild-moderate.   6. The aortic valve is tricuspid. Mild thickening of the aortic valve.  Mild calcification of the aortic valve. Aortic valve regurgitation is mild  by color flow Doppler.   EKG:  EKG is personally reviewed.   02/20/22: a-paced v sensed rhythm 87 bpm 12/30/2020: AV dual paced rhythm at 80 bpm  Recent Labs: No results found for requested labs within last 365 days.  Recent  Lipid Panel    Component Value Date/Time   CHOL  06/04/2008 1430    171        ATP III CLASSIFICATION:  <200     mg/dL   Desirable  200-239  mg/dL   Borderline High  >=240    mg/dL   High          TRIG 144 06/04/2008 1430   HDL 47 06/04/2008 1430   CHOLHDL 3.6 06/04/2008 1430   VLDL 29 06/04/2008 1430   LDLCALC  06/04/2008 1430    95        Total Cholesterol/HDL:CHD Risk Coronary Heart Disease Risk Table                     Men   Women  1/2 Average Risk   3.4   3.3  Average Risk       5.0   4.4  2 X Average Risk   9.6   7.1  3 X Average Risk  23.4   11.0        Use the calculated Patient Ratio above and the CHD Risk Table to determine the patient's CHD Risk.        ATP III CLASSIFICATION (LDL):  <100     mg/dL   Optimal  100-129  mg/dL   Near or Above                    Optimal  130-159  mg/dL   Borderline  160-189  mg/dL   High  >190     mg/dL   Very High    Physical Exam:    VS:  BP 110/68 (BP Location: Left Arm, Patient Position: Sitting, Cuff Size: Large)   Pulse 87   Ht 5' (1.524 m)   Wt 213 lb 1.6 oz (96.7 kg)   BMI 41.62 kg/m     Wt Readings from Last 3 Encounters:  02/20/22 213 lb 1.6 oz (96.7 kg)  06/16/21 202 lb 3.2 oz (91.7 kg)  03/07/21 203 lb 3.2 oz (92.2 kg)    GEN: Well nourished, well developed in no acute distress HEENT: Normal, moist mucous membranes NECK: No JVD CARDIAC: regular rhythm, normal S1 and S2, no rubs or gallops. No murmur. VASCULAR:  Radial and DP pulses 2+ bilaterally. No carotid bruits RESPIRATORY:  No rales, wheezing or rhonchi. Lungs clear to ausculation.  ABDOMEN: Soft, non-tender, non-distended MUSCULOSKELETAL:  Ambulates independently SKIN: Warm and dry, no edema  NEUROLOGIC:  Alert and oriented x 3. No focal neuro deficits noted. PSYCHIATRIC:  Normal affect    ASSESSMENT:    1. Shortness of breath   2. Mobitz type II atrioventricular block   3. Cardiac pacemaker in situ   4. Peripheral edema   5. Chronic  fatigue   6. OSA on CPAP     PLAN:    Shortness of breath Intermittent LE edema Fatigue, chronic -much improved with gen change, but daughter still notes that she sounds short of breath even on the phone -appears euvolemic today -last echo 2020 largely unremarkable -discussed heart failure vs noncardiac causes of SOB -will check labs today -counseled on red flag warning signs that need immediate medical attention  Second degree AV block type II Permanent DC-PPM Frequent PVCs -followed by Dr. Rayann Heman -s/p gen change for PPM, symptoms improved -on metoprolol for PVCs  Hypertension: Intermittent LE edema -on amlodipine, telmisartan-HCTZ -Discussed compression, elevation of legs, which she states helps with edema as well. -lasix only PRN  Cardiac risk counseling and prevention recommendations: -recommend heart healthy/Mediterranean diet, with whole grains, fruits, vegetable, fish, lean meats, nuts, and olive oil. Limit salt. -recommend moderate walking, 3-5 times/week for 30-50 minutes each session. Aim for at least 150 minutes.week. Goal should be pace of 3 miles/hours, or walking 1.5 miles in 30 minutes -recommend avoidance of tobacco products. Avoid excess alcohol. -ASCVD risk score: The ASCVD Risk score (Arnett DK, et al., 2019) failed to calculate for the following reasons:   The 2019 ASCVD risk score is only valid for ages 72 to 11    Plan for follow up: 1 year or sooner if needed  Buford Dresser, MD, PhD, [facility]    Medication Adjustments/Labs and Tests Ordered: Current medicines are reviewed at length with the patient today.  Concerns regarding medicines are outlined above.  Orders Placed This Encounter  Procedures   TSH   B Nat Peptide   Basic metabolic panel   EKG [id number]    No orders of the defined types were placed in this encounter.    Patient Instructions  Medication Instructions:  None *If you need a refill on your  cardiac medications before your next appointment, please call your pharmacy*   Lab Work: BMET BNP Thyroid  If you have labs (blood work) drawn today and your tests are completely normal, you will receive your results only by: [place] (if you have MyChart) OR A paper copy in the mail If you have any lab test that is abnormal or we need to change your treatment, we will call you to review the results.   Testing/Procedures: None   Follow-Up: At [facility], you and your health needs are our priority.  As part of our continuing mission to provide you with exceptional heart care, we have created designated Provider Care Teams.  These Care Teams include your primary Cardiologist (physician) and Advanced Practice Providers (APPs -  Physician Assistants and Nurse Practitioners) who all work together to provide you with the care you need, when you need it.  We recommend signing up for the patient portal called "MyChart".  Sign up information is provided on this After Visit Summary.  MyChart is used to connect with patients for Virtual Visits (Telemedicine).  Patients are able to view lab/test results, encounter notes, upcoming appointments, etc.  Non-urgent messages can be sent to your provider as well.   To learn more about what you can do with MyChart, go to [email or web address].    Your next appointment:   1 year(s)  The format for your next appointment:   In Person  Provider:   Buford Dresser, MD   Other Instructions None            I,Alexis Herring,acting as a scribe for Buford Dresser, MD.,have documented all relevant documentation on the behalf of Buford Dresser, MD,as directed by  Buford Dresser, MD while in the presence of Buford Dresser, MD.  I, Buford Dresser, MD, have reviewed all documentation for this visit. The documentation on 03/23/22 for the exam, diagnosis, procedures, and orders are all accurate  and complete.   Signed, Buford Dresser, MD PhD 02/20/2022     [place]

## 2022-02-20 NOTE — Patient Instructions (Signed)
Medication Instructions:  None *If you need a refill on your cardiac medications before your next appointment, please call your pharmacy*   Lab Work: BMET BNP Thyroid  If you have labs (blood work) drawn today and your tests are completely normal, you will receive your results only by: [place] (if you have MyChart) OR A paper copy in the mail If you have any lab test that is abnormal or we need to change your treatment, we will call you to review the results.   Testing/Procedures: None   Follow-Up: At [facility], you and your health needs are our priority.  As part of our continuing mission to provide you with exceptional heart care, we have created designated Provider Care Teams.  These Care Teams include your primary Cardiologist (physician) and Advanced Practice Providers (APPs -  Physician Assistants and Nurse Practitioners) who all work together to provide you with the care you need, when you need it.  We recommend signing up for the patient portal called "MyChart".  Sign up information is provided on this After Visit Summary.  MyChart is used to connect with patients for Virtual Visits (Telemedicine).  Patients are able to view lab/test results, encounter notes, upcoming appointments, etc.  Non-urgent messages can be sent to your provider as well.   To learn more about what you can do with MyChart, go to [email or web address].    Your next appointment:   1 year(s)  The format for your next appointment:   In Person  Provider:   Buford Dresser, MD   Other Instructions None

## 2022-02-21 LAB — BASIC METABOLIC PANEL
BUN/Creatinine Ratio: 25 (ref 12–28)
BUN: 29 mg/dL — ABNORMAL HIGH (ref 8–27)
CO2: 24 mmol/L (ref 20–29)
Calcium: 10 mg/dL (ref 8.7–10.3)
Chloride: 104 mmol/L (ref 96–106)
Creatinine, Ser: 1.14 mg/dL — ABNORMAL HIGH (ref 0.57–1.00)
Glucose: 109 mg/dL — ABNORMAL HIGH (ref 70–99)
Potassium: 5 mmol/L (ref 3.5–5.2)
Sodium: 142 mmol/L (ref 134–144)
eGFR: 47 mL/min/{1.73_m2} — ABNORMAL LOW (ref >59)

## 2022-02-21 LAB — TSH: TSH: 2.04 u[IU]/mL (ref 0.450–4.500)

## 2022-02-21 LAB — BRAIN NATRIURETIC PEPTIDE: BNP: 24.9 pg/mL (ref 0.0–100.0)

## 2022-02-23 ENCOUNTER — Telehealth (HOSPITAL_BASED_OUTPATIENT_CLINIC_OR_DEPARTMENT_OTHER): Payer: Self-pay

## 2022-02-23 NOTE — Telephone Encounter (Addendum)
Results called to patient who verbalizes understanding!      ----- Message from Loel Dubonnet, NP sent at 02/23/2022  8:52 AM EST ----- Stable renal function.  Normal electrolytes.  Normal thyroid.  BNP with no evidence of volume overload.

## 2022-02-26 ENCOUNTER — Ambulatory Visit
Admission: RE | Admit: 2022-02-26 | Discharge: 2022-02-26 | Disposition: A | Discharge status: Home / Self Care | Payer: PPO | Source: Ambulatory Visit | Attending: Internal Medicine | Admitting: Internal Medicine

## 2022-02-26 DIAGNOSIS — Z1231 Encounter for screening mammogram for malignant neoplasm of breast: Secondary | ICD-10-CM | POA: Diagnosis not present

## 2022-03-04 ENCOUNTER — Encounter (INDEPENDENT_AMBULATORY_CARE_PROVIDER_SITE_OTHER): Payer: PPO | Admitting: Ophthalmology

## 2022-03-04 DIAGNOSIS — H43813 Vitreous degeneration, bilateral: Secondary | ICD-10-CM

## 2022-03-04 DIAGNOSIS — I10 Essential (primary) hypertension: Secondary | ICD-10-CM | POA: Diagnosis not present

## 2022-03-04 DIAGNOSIS — H353231 Exudative age-related macular degeneration, bilateral, with active choroidal neovascularization: Secondary | ICD-10-CM | POA: Diagnosis not present

## 2022-03-04 DIAGNOSIS — H35372 Puckering of macula, left eye: Secondary | ICD-10-CM | POA: Diagnosis not present

## 2022-03-04 DIAGNOSIS — H35033 Hypertensive retinopathy, bilateral: Secondary | ICD-10-CM | POA: Diagnosis not present

## 2022-03-05 ENCOUNTER — Ambulatory Visit (INDEPENDENT_AMBULATORY_CARE_PROVIDER_SITE_OTHER): Payer: PPO

## 2022-03-05 DIAGNOSIS — I441 Atrioventricular block, second degree: Secondary | ICD-10-CM

## 2022-03-05 DIAGNOSIS — R7301 Impaired fasting glucose: Secondary | ICD-10-CM | POA: Diagnosis not present

## 2022-03-05 DIAGNOSIS — E785 Hyperlipidemia, unspecified: Secondary | ICD-10-CM | POA: Diagnosis not present

## 2022-03-05 DIAGNOSIS — I10 Essential (primary) hypertension: Secondary | ICD-10-CM | POA: Diagnosis not present

## 2022-03-05 DIAGNOSIS — G4733 Obstructive sleep apnea (adult) (pediatric): Secondary | ICD-10-CM | POA: Diagnosis not present

## 2022-03-05 DIAGNOSIS — R2681 Unsteadiness on feet: Secondary | ICD-10-CM | POA: Diagnosis not present

## 2022-03-05 LAB — CUP PACEART REMOTE DEVICE CHECK
Battery Remaining Longevity: 116 mo
Battery Voltage: 3.02 V
Brady Statistic AP VP Percent: 12.22 %
Brady Statistic AP VS Percent: 0 %
Brady Statistic AS VP Percent: 87.78 %
Brady Statistic AS VS Percent: 0.01 %
Brady Statistic RA Percent Paced: 12.23 %
Brady Statistic RV Percent Paced: 99.99 %
Date Time Interrogation Session: 20231116000258
Implantable Lead Connection Status: 753985
Implantable Lead Connection Status: 753985
Implantable Lead Implant Date: 20100217
Implantable Lead Implant Date: 20100217
Implantable Lead Location: 753859
Implantable Lead Location: 753860
Implantable Lead Model: 5076
Implantable Lead Model: 5076
Implantable Pulse Generator Implant Date: 20220816
Lead Channel Impedance Value: 380 Ohm
Lead Channel Impedance Value: 380 Ohm
Lead Channel Impedance Value: 437 Ohm
Lead Channel Impedance Value: 456 Ohm
Lead Channel Pacing Threshold Amplitude: 0.5 V
Lead Channel Pacing Threshold Amplitude: 0.75 V
Lead Channel Pacing Threshold Pulse Width: 0.4 ms
Lead Channel Pacing Threshold Pulse Width: 0.4 ms
Lead Channel Sensing Intrinsic Amplitude: 1.5 mV
Lead Channel Sensing Intrinsic Amplitude: 1.5 mV
Lead Channel Sensing Intrinsic Amplitude: 22.25 mV
Lead Channel Sensing Intrinsic Amplitude: 22.25 mV
Lead Channel Setting Pacing Amplitude: 1.75 V
Lead Channel Setting Pacing Amplitude: 2.5 V
Lead Channel Setting Pacing Pulse Width: 0.4 ms
Lead Channel Setting Sensing Sensitivity: 4 mV
Zone Setting Status: 755011

## 2022-03-16 ENCOUNTER — Ambulatory Visit (HOSPITAL_BASED_OUTPATIENT_CLINIC_OR_DEPARTMENT_OTHER): Payer: PPO | Admitting: Cardiology

## 2022-03-16 DIAGNOSIS — G4733 Obstructive sleep apnea (adult) (pediatric): Secondary | ICD-10-CM | POA: Diagnosis not present

## 2022-03-24 ENCOUNTER — Ambulatory Visit (HOSPITAL_BASED_OUTPATIENT_CLINIC_OR_DEPARTMENT_OTHER): Payer: PPO | Admitting: Cardiology

## 2022-03-25 NOTE — Progress Notes (Signed)
Remote pacemaker transmission.   

## 2022-04-01 ENCOUNTER — Encounter (INDEPENDENT_AMBULATORY_CARE_PROVIDER_SITE_OTHER): Payer: PPO | Admitting: Ophthalmology

## 2022-04-01 DIAGNOSIS — I10 Essential (primary) hypertension: Secondary | ICD-10-CM

## 2022-04-01 DIAGNOSIS — H353231 Exudative age-related macular degeneration, bilateral, with active choroidal neovascularization: Secondary | ICD-10-CM

## 2022-04-01 DIAGNOSIS — H43813 Vitreous degeneration, bilateral: Secondary | ICD-10-CM | POA: Diagnosis not present

## 2022-04-01 DIAGNOSIS — H35372 Puckering of macula, left eye: Secondary | ICD-10-CM | POA: Diagnosis not present

## 2022-04-01 DIAGNOSIS — H35033 Hypertensive retinopathy, bilateral: Secondary | ICD-10-CM

## 2022-04-09 NOTE — Progress Notes (Signed)
Cardiology Office Note Date:  04/10/2022  Patient ID:  [name], [name] [birth year], MRN [id number] PCP:  Donnajean Lopes, MD  Cardiologist:  Buford Dresser, MD Electrophysiologist: [name], MD >> Melida Quitter, MD     Chief Complaint: 31yrfollow-up PPM  History of Present Illness: Tracey POLKAis a 87y.o. female seen today for AMelida Quitter MD for routine electrophysiology followup.   Last saw Dr. ARayann Heman11/2022 with some far-field sensing on atrial lead. Sensing adjusted with improvement.   Saw Dr. CHarrell Gave11/2023,having worsening SOB, cough. Labs and BNP wnl.  Today, she continues to report SOB with minimal activity, noting that if the phone rings and she walks across the room to answer it, she is SOB. People often tell her she sounds breathless when on the phone. She is not very active during the day. She used to be much more active when she was caring for her husband, but since his passing 3 years ago, her physical activity has drastically reduced. She was able to mop this AM without SOB. When she is SOB, denies CP, palpitations, N/V, just feels like she can't get a deep breath in.  She takes her lasix PRN, usually about 1/week when she notices increased lower extremity edema, R> L. Last took lasix about a week ago.    Device Information: MDT dual-chamber PPM (CHB), gen change 11/2020   Past Medical History:  Diagnosis Date   Abnormal heart rhythm    Anemia    hx of not recent    Atypical chest pain    Cardiomegaly    Chronic headaches    Chronic kidney disease    Classic migraine with aura 12/17/2014   Depression    Dysrhythmia    HX OF MOBITZ 2 AV BLOCK AND FUNCTIONING PACEMAKER -DR. JAMES ALLRED FOLLOWS PT'S PACEMAKER CARE   GERD (gastroesophageal reflux disease)    Headache(784.0)    PT STATES HER CHRONIC H/A'S HAVE RESOLVED   Hypercholesteremia    Hyperlipidemia    Hypertension    Obstructive sleep apnea on CPAP     Osteoarthritis    Pacemaker    Recurrent upper respiratory infection (URI)    MARCH 2013 DX WITH BRONCHITIS--PT STATES RESOLVED--DOES HAVE SOME ALLERGIES AT PRESENT   Second degree Mobitz II AV block    s/p PPM by Dr KVerlon Setting2010   Sleep apnea    on CPAP-uses [facility]   Urinary incontinence     Past Surgical History:  Procedure Laterality Date   CARPAL TUNNEL RELEASE Bilateral    CATARACT EXTRACTION Left    CESAREAN SECTION     FOOT SURGERY     HAND SURGERY     KNEE CLOSED REDUCTION  11/30/2011   Procedure: CLOSED MANIPULATION KNEE;  Surgeon: FGearlean Alf MD;  Location: [facility];  Service: Orthopedics;  Laterality: Left;   KNEE SURGERY     Arthroscopic surg   PACEMAKER INSERTION  2010   dual-chamber (MDT) implanted by Dr KVerlon Setting  [facility]2020   Procedure: PPM GENERATOR CHANGEOUT;  Surgeon: AThompson Grayer MD;  [facility];  Service: Cardiovascular;  Laterality: N/A;   TOTAL KNEE ARTHROPLASTY Bilateral    right   TOTAL KNEE ARTHROPLASTY  08/24/2011   Procedure: TOTAL KNEE ARTHROPLASTY;  Surgeon: FGearlean Alf MD;  Location: [facility];  Service: Orthopedics;  Laterality: Left;   TUBAL LIGATION  1968    Current Outpatient Medications  Medication  Sig Dispense Refill   amLODipine (NORVASC) 5 MG tablet Take 5 mg by mouth at bedtime.     clindamycin (CLEOCIN) 300 MG capsule Take 600 mg by mouth See admin instructions. Take 600 mg before dental work then 600 mg after the dental work     furosemide (LASIX) 20 MG tablet TAKE 1 TABLET BY MOUTH EVERY DAY AS NEEDED 90 tablet 3   metoprolol succinate (TOPROL-XL) 50 MG 24 hr tablet Take 50 mg by mouth daily.     mirabegron ER (MYRBETRIQ) 25 MG TB24 tablet Take 25 mg by mouth daily.     Multiple Vitamins-Minerals (PRESERVISION AREDS 2) CAPS Take 1 capsule by mouth 2 (two) times daily.     omeprazole (PRILOSEC) 20 MG capsule Take 20 mg by mouth every morning.     simvastatin (ZOCOR) 40 MG tablet Take 40 mg  by mouth at bedtime.     telmisartan-hydrochlorothiazide (MICARDIS HCT) 80-12.5 MG per tablet Take 1 tablet by mouth daily with breakfast.     zolpidem (AMBIEN) 5 MG tablet Take 2.5 mg by mouth at bedtime.     No current facility-administered medications for this visit.    Allergies:   Ace inhibitors, Amoxicillin, and Codeine   Social History:  The patient  reports that she has never smoked. She has never used smokeless tobacco. She reports that she does not drink alcohol and does not use drugs.   Family History:  The patient's family history includes Allergies in her father; Breast cancer in her mother; Dementia in her mother; Diabetes in her mother; Emphysema in her father; Heart attack in her father; Heart disease in her father and mother; Hypertension in her father and mother; Macular degeneration in her father and sister; Migraines in her daughter; Osteoarthritis in her mother.  ROS:  Please see the history of present illness. All other systems are reviewed and otherwise negative.   PHYSICAL EXAM:  VS:  BP (!) 118/58   Pulse 99   Ht 5' (1.524 m)   Wt 207 lb (93.9 kg)   SpO2 96%   BMI 40.43 kg/m  BMI: Body mass index is 40.43 kg/m.  GEN- The patient is well appearing, alert and oriented x 3 today.   HEENT: normocephalic, atraumatic; sclera clear, conjunctiva pink; hearing intact; oropharynx clear; neck supple, no JVP Lungs- Clear to ausculation bilaterally, normal work of breathing.  No wheezes, rales, rhonchi Heart- Regular rate and rhythm, no murmurs, rubs or gallops, PMI not laterally displaced GI- soft, non-tender, non-distended, bowel sounds present, no hepatosplenomegaly Extremities- 1-2+ peripheral edema, R>L. no clubbing or cyanosis; DP/PT/radial pulses 2+ bilaterally MS- no significant deformity or atrophy Skin- warm and dry, no rash or lesion Psych- euthymic mood, full affect Neuro- strength and sensation are intact   Device interrogation done today and reviewed by  myself:  Battery good Lead thresholds, impedence, sensing stable  1 HVR episode, appears A tach 99% V-pacing percentage, stable from previous No changes made today  EKG is ordered. Personal review of EKG from today shows:  V-paced at 92bpm  Recent Labs: 02/20/2022: BNP 24.9; BUN 29; Creatinine, Ser 1.14; Potassium 5.0; Sodium 142; TSH 2.040  No results found for requested labs within last 365 days.   CrCl cannot be calculated (Patient's most recent lab result is older than the maximum 21 days allowed.).   Wt Readings from Last 3 Encounters:  04/10/22 207 lb (93.9 kg)  02/20/22 213 lb 1.6 oz (96.7 kg)  06/16/21 202 lb 3.2 oz (  91.7 kg)     Additional studies reviewed include: Previous EP notes.  Previous Cardiology notes.    Echo 01/06/2021  1. Left ventricular ejection fraction, by estimation, is 65 to 70%. The left ventricle has normal function. The left ventricle has no regional wall motion abnormalities. Left ventricular diastolic parameters are consistent with Grade I diastolic dysfunction (impaired relaxation).   2. Right ventricular systolic function is normal. The right ventricular size is normal.   3. The mitral valve is grossly normal. Trivial mitral valve regurgitation.   4. The aortic valve is normal in structure. Aortic valve regurgitation is not visualized. No aortic stenosis is present.   5. The inferior vena cava is normal in size with greater than 50% respiratory variability, suggesting right atrial pressure of 3 mmHg.   Comparison(s): The left ventricle has hyperdynamic systolic function, with an ejection fraction of >65%.   ASSESSMENT AND PLAN:  #) CHB s/p MDT PPM Device functioning well See PaceArt for details   #) SOB #) Grade 1 DD Not new, but not improving. Unclear if cardiac or pulm cause. I've encouraged her to reach out to PCP for further pulm cause. Increased BLE, clear lungs on exam. She is also minimally active during the day, so ?deconditioning.  We discussed slowly increasing walking to see if symptoms improve.  Increase lasix to 2/week for next 2 weeks.  Daily weights - if greater than 3lb gain in 1 day, or 5lbs in a week, take lasix. - update echo - updated labs   #) HTN Well-controlled in clinic today, cont to monitor.  #) transfer care Dr. Rayann Heman has retired, she prefers to see provider in [facility] office. Tx care to Dr. Myles Gip.      Current medicines are reviewed at length with the patient today.   The patient does not have concerns regarding her medicines.  The following changes were made today:   --Increased lasix to twice a week x 2 weeks, then take PRN dose based on weights and lower extremity edema  Labs/ tests ordered today include:  Orders Placed This Encounter  Procedures   Basic metabolic panel   CBC   Pro b natriuretic peptide (BNP)   CUP PACEART INCLINIC DEVICE CHECK   EKG 12-Lead   ECHOCARDIOGRAM COMPLETE     Disposition: Follow up with Dr. Myles Gip in 12 months to establish care with new MD   Signed, Mamie Levers, NP  04/10/22 1:16 PM   [place] [place] [address] [phone] (office)  [phone] (fax)

## 2022-04-10 ENCOUNTER — Ambulatory Visit: Payer: PPO | Attending: Student | Admitting: Cardiology

## 2022-04-10 ENCOUNTER — Encounter: Payer: Self-pay | Admitting: Student

## 2022-04-10 VITALS — BP 118/58 | HR 99 | Ht 60.0 in | Wt 207.0 lb

## 2022-04-10 DIAGNOSIS — I5189 Other ill-defined heart diseases: Secondary | ICD-10-CM

## 2022-04-10 DIAGNOSIS — I10 Essential (primary) hypertension: Secondary | ICD-10-CM

## 2022-04-10 DIAGNOSIS — I441 Atrioventricular block, second degree: Secondary | ICD-10-CM

## 2022-04-10 DIAGNOSIS — Z950 Presence of cardiac pacemaker: Secondary | ICD-10-CM | POA: Diagnosis not present

## 2022-04-10 DIAGNOSIS — R0602 Shortness of breath: Secondary | ICD-10-CM | POA: Insufficient documentation

## 2022-04-10 LAB — CUP PACEART INCLINIC DEVICE CHECK
Date Time Interrogation Session: 20231222125835
Implantable Lead Connection Status: 753985
Implantable Lead Connection Status: 753985
Implantable Lead Implant Date: 20100217
Implantable Lead Implant Date: 20100217
Implantable Lead Location: 753859
Implantable Lead Location: 753860
Implantable Lead Model: 5076
Implantable Lead Model: 5076
Implantable Pulse Generator Implant Date: 20220816

## 2022-04-10 NOTE — Patient Instructions (Signed)
Medication Instructions:  Your physician has recommended you make the following change in your medication:   Take Furosemide (lasix) twice weekly for 2 weeks then take as needed   *If you need a refill on your cardiac medications before your next appointment, please call your pharmacy*   Lab Work: TODAY: BMET, CBC, BNP  If you have labs (blood work) drawn today and your tests are completely normal, you will receive your results only by: [place] (if you have MyChart) OR A paper copy in the mail If you have any lab test that is abnormal or we need to change your treatment, we will call you to review the results.   Testing/Procedures: Your physician has requested that you have an echocardiogram. Echocardiography is a painless test that uses sound waves to create images of your heart. It provides your doctor with information about the size and shape of your heart and how well your heart's chambers and valves are working. This procedure takes approximately one hour. There are no restrictions for this procedure. Please do NOT wear cologne, perfume, aftershave, or lotions (deodorant is allowed). Please arrive 15 minutes prior to your appointment time.   Follow-Up: At [facility], you and your health needs are our priority.  As part of our continuing mission to provide you with exceptional heart care, we have created designated Provider Care Teams.  These Care Teams include your primary Cardiologist (physician) and Advanced Practice Providers (APPs -  Physician Assistants and Nurse Practitioners) who all work together to provide you with the care you need, when you need it.  We recommend signing up for the patient portal called "MyChart".  Sign up information is provided on this After Visit Summary.  MyChart is used to connect with patients for Virtual Visits (Telemedicine).  Patients are able to view lab/test results, encounter notes, upcoming appointments, etc.  Non-urgent  messages can be sent to your provider as well.   To learn more about what you can do with MyChart, go to [email or web address].    Your next appointment:   1 year(s)  The format for your next appointment:   In Person  Provider:   Doralee Albino, MD    Important Information About Sugar

## 2022-04-11 LAB — BASIC METABOLIC PANEL
BUN/Creatinine Ratio: 27 (ref 12–28)
BUN: 30 mg/dL — ABNORMAL HIGH (ref 8–27)
CO2: 20 mmol/L (ref 20–29)
Calcium: 10 mg/dL (ref 8.7–10.3)
Chloride: 104 mmol/L (ref 96–106)
Creatinine, Ser: 1.12 mg/dL — ABNORMAL HIGH (ref 0.57–1.00)
Glucose: 101 mg/dL — ABNORMAL HIGH (ref 70–99)
Potassium: 4.6 mmol/L (ref 3.5–5.2)
Sodium: 139 mmol/L (ref 134–144)
eGFR: 48 mL/min/{1.73_m2} — ABNORMAL LOW (ref >59)

## 2022-04-11 LAB — CBC
Hematocrit: 43 % (ref 34.0–46.6)
Hemoglobin: 14 g/dL (ref 11.1–15.9)
MCH: 29.2 pg (ref 26.6–33.0)
MCHC: 32.6 g/dL (ref 31.5–35.7)
MCV: 90 fL (ref 79–97)
Platelets: 274 10*3/uL (ref 150–450)
RBC: 4.79 x10E6/uL (ref 3.77–5.28)
RDW: 12.7 % (ref 11.7–15.4)
WBC: 8.2 10*3/uL (ref 3.4–10.8)

## 2022-04-11 LAB — PRO B NATRIURETIC PEPTIDE: NT-Pro BNP: 103 pg/mL (ref 0–738)

## 2022-04-15 DIAGNOSIS — G4733 Obstructive sleep apnea (adult) (pediatric): Secondary | ICD-10-CM | POA: Diagnosis not present

## 2022-04-20 DIAGNOSIS — G4733 Obstructive sleep apnea (adult) (pediatric): Secondary | ICD-10-CM | POA: Diagnosis not present

## 2022-04-29 ENCOUNTER — Encounter (INDEPENDENT_AMBULATORY_CARE_PROVIDER_SITE_OTHER): Payer: PPO | Admitting: Ophthalmology

## 2022-04-29 DIAGNOSIS — I10 Essential (primary) hypertension: Secondary | ICD-10-CM

## 2022-04-29 DIAGNOSIS — H35033 Hypertensive retinopathy, bilateral: Secondary | ICD-10-CM

## 2022-04-29 DIAGNOSIS — H353231 Exudative age-related macular degeneration, bilateral, with active choroidal neovascularization: Secondary | ICD-10-CM

## 2022-04-29 DIAGNOSIS — H35372 Puckering of macula, left eye: Secondary | ICD-10-CM

## 2022-04-29 DIAGNOSIS — H43813 Vitreous degeneration, bilateral: Secondary | ICD-10-CM | POA: Diagnosis not present

## 2022-05-13 ENCOUNTER — Ambulatory Visit (HOSPITAL_COMMUNITY): Payer: PPO | Attending: Cardiology

## 2022-05-13 DIAGNOSIS — R0602 Shortness of breath: Secondary | ICD-10-CM | POA: Insufficient documentation

## 2022-05-13 LAB — ECHOCARDIOGRAM COMPLETE
Area-P 1/2: 3.62 cm2
P 1/2 time: 399 msec
S' Lateral: 2.7 cm

## 2022-05-16 DIAGNOSIS — G4733 Obstructive sleep apnea (adult) (pediatric): Secondary | ICD-10-CM | POA: Diagnosis not present

## 2022-05-19 DIAGNOSIS — G4733 Obstructive sleep apnea (adult) (pediatric): Secondary | ICD-10-CM | POA: Diagnosis not present

## 2022-05-20 ENCOUNTER — Telehealth: Payer: Self-pay | Admitting: Cardiology

## 2022-05-20 NOTE — Telephone Encounter (Signed)
Patient returning call for echo results. 

## 2022-05-20 NOTE — Telephone Encounter (Signed)
Pt called and wanted some clarification on her Echo results. I explained it to her. I advised her that if she becomes more SOB than she already is now then she should let us know and we would address it again at that time.

## 2022-05-27 ENCOUNTER — Encounter (INDEPENDENT_AMBULATORY_CARE_PROVIDER_SITE_OTHER): Payer: PPO | Admitting: Ophthalmology

## 2022-05-27 DIAGNOSIS — I10 Essential (primary) hypertension: Secondary | ICD-10-CM

## 2022-05-27 DIAGNOSIS — H35372 Puckering of macula, left eye: Secondary | ICD-10-CM

## 2022-05-27 DIAGNOSIS — H43813 Vitreous degeneration, bilateral: Secondary | ICD-10-CM | POA: Diagnosis not present

## 2022-05-27 DIAGNOSIS — H353231 Exudative age-related macular degeneration, bilateral, with active choroidal neovascularization: Secondary | ICD-10-CM

## 2022-05-27 DIAGNOSIS — H35033 Hypertensive retinopathy, bilateral: Secondary | ICD-10-CM

## 2022-05-29 DIAGNOSIS — R040 Epistaxis: Secondary | ICD-10-CM | POA: Diagnosis not present

## 2022-06-01 DIAGNOSIS — R2681 Unsteadiness on feet: Secondary | ICD-10-CM | POA: Diagnosis not present

## 2022-06-01 DIAGNOSIS — R197 Diarrhea, unspecified: Secondary | ICD-10-CM | POA: Diagnosis not present

## 2022-06-04 ENCOUNTER — Ambulatory Visit (INDEPENDENT_AMBULATORY_CARE_PROVIDER_SITE_OTHER): Payer: PPO

## 2022-06-04 DIAGNOSIS — I441 Atrioventricular block, second degree: Secondary | ICD-10-CM | POA: Diagnosis not present

## 2022-06-05 LAB — CUP PACEART REMOTE DEVICE CHECK
Battery Remaining Longevity: 114 mo
Battery Voltage: 3.02 V
Brady Statistic AP VP Percent: 10.02 %
Brady Statistic AP VS Percent: 0 %
Brady Statistic AS VP Percent: 89.97 %
Brady Statistic AS VS Percent: 0 %
Brady Statistic RA Percent Paced: 10.05 %
Brady Statistic RV Percent Paced: 99.99 %
Date Time Interrogation Session: 20240215060238
Implantable Lead Connection Status: 753985
Implantable Lead Connection Status: 753985
Implantable Lead Implant Date: 20100217
Implantable Lead Implant Date: 20100217
Implantable Lead Location: 753859
Implantable Lead Location: 753860
Implantable Lead Model: 5076
Implantable Lead Model: 5076
Implantable Pulse Generator Implant Date: 20220816
Lead Channel Impedance Value: 361 Ohm
Lead Channel Impedance Value: 380 Ohm
Lead Channel Impedance Value: 437 Ohm
Lead Channel Impedance Value: 475 Ohm
Lead Channel Pacing Threshold Amplitude: 0.375 V
Lead Channel Pacing Threshold Amplitude: 0.875 V
Lead Channel Pacing Threshold Pulse Width: 0.4 ms
Lead Channel Pacing Threshold Pulse Width: 0.4 ms
Lead Channel Sensing Intrinsic Amplitude: 1.5 mV
Lead Channel Sensing Intrinsic Amplitude: 1.5 mV
Lead Channel Sensing Intrinsic Amplitude: 22.25 mV
Lead Channel Sensing Intrinsic Amplitude: 22.25 mV
Lead Channel Setting Pacing Amplitude: 1.75 V
Lead Channel Setting Pacing Amplitude: 2.5 V
Lead Channel Setting Pacing Pulse Width: 0.4 ms
Lead Channel Setting Sensing Sensitivity: 4 mV
Zone Setting Status: 755011

## 2022-06-13 NOTE — Progress Notes (Signed)
Patient ID: [name], female    DOB: Feb 14, 1935, 88 y.o.   MRN: [id number]  HPI Female never smoker followed for cough/ GERD, OSA/CPAP, complicated by HBP, [id number]/ Pacemaker NPSG 11/14/00- AHI 22/ hr, desaturation to 84% PFT at [facility] 03/18/10 showed mild reversible small  airway obstruction  -------------------------------------------------------------------------------------------------------.   06/16/21- 88 year old female never smoker followed for OSA/CPAP, Cough/ GERD,  complicated by HBP, FIEPP/[id number]/ pacemaker CPAP auto 7-15/ [facility] Download- compliance 100%, AHI 1.2/ hr Body weight today-202 lbs Covid vax-4 Phizer                        Covid test here today Negative Flu vax- had -----Last Tuesday started a productive cough with with clear sputum. Has increased shortness of breath since cough started. Has been taking  AZITHROMYCIN since Friday.  Acute upper respiratory symptoms x5 days starting with bad sore throat, cough productive mostly clear but no fever or myalgias.  Beginning to get better now but still mildly short of breath.  Feels like a "cold". Reports doing well with CPAP.  "Life is better". Uses So[employer] machine- discussed. Had been very short of breath in recent months until she got pacemaker replaced-dramatic difference.  06/16/22- 88 year old female never smoker followed for OSA/CPAP, Cough/ GERD,  complicated by HBP, AYTKZ/[id number]/ pacemaker CPAP auto 7-15/ [facility] Download- compliance 100%, AHI 1.2/ hr Body weight today-206 lbs Covid vax-4 Phizer                         Flu vax-had New machine working well.  Download reviewed with her.  No concerns. She has had episodes of epistaxis from left nostril for which she is seeing Dr. Olga Coaster. Some dyspnea on exertion-nothing acute.  Occasional dry cough.  No wheezing.  BMI on arrival today 40.3.  Review of Systems-See HPI + = positive Constitutional:   No-   weight loss, night sweats, fevers, chills,  fatigue, lassitude. HEENT:   No-  headaches, difficulty swallowing, tooth/dental problems, sore throat,       No-  sneezing, itching, ear ache, nasal congestion, post nasal drip,  CV:  No-   chest pain, orthopnea, PND, swelling in lower extremities, anasarca, as dizziness, palpitations Resp: +shortness of breath with exertion or at rest.               productive cough,  + non-productive cough,  No- coughing up of blood.               change in color of mucus.  No- wheezing.   Skin: No-   rash or lesions. GI:  No-   heartburn, indigestion, abdominal pain, nausea, vomiting,  GU:  MS:  No-   joint pain or swelling.   Neuro-     nothing unusual Psych:  No- change in mood or affect. No depression or anxiety.  No memory loss.    Objective:   Physical Exam General- Alert, Oriented, Affect-appropriate, Distress- none acute, + obese, +elderly Skin- rash-none, lesions- none, excoriation- none Lymphadenopathy- none Head- atraumatic            Eyes- Gross vision intact, PERRLA, conjunctivae clear secretions            Ears- Hearing, canals-normal            Nose- Clear, no-Septal dev, mucosa clear polyps, erosion, perforation             Throat- Mallampati III-IV , mucosa+ minimally  red , drainage- none, tonsils- atrophic Neck- flexible , trachea midline, no stridor , thyroid nl, carotid no bruit Chest - symmetrical excursion , unlabored           Heart/CV- RRR , no murmur , no gallop  , no rub, nl s1 s2                           - JVD- none , edema- none, stasis changes- none, varices- none           Lung-  wheeze+mild  cough- none , dullness-none, rub- none.            Chest wall-+ L pacemaker Abd-  Br/ Gen/ Rectal- Not done, not indicated Extrem- cyanosis- none, clubbing, none, atrophy- none, strength- nl Neuro- grossly intact to observation

## 2022-06-16 ENCOUNTER — Ambulatory Visit: Payer: PPO | Admitting: Internal Medicine

## 2022-06-16 ENCOUNTER — Ambulatory Visit (INDEPENDENT_AMBULATORY_CARE_PROVIDER_SITE_OTHER): Payer: PPO

## 2022-06-16 ENCOUNTER — Encounter: Payer: Self-pay | Admitting: Internal Medicine

## 2022-06-16 VITALS — BP 122/74 | HR 94 | Ht 60.0 in | Wt 206.6 lb

## 2022-06-16 DIAGNOSIS — R053 Chronic cough: Secondary | ICD-10-CM | POA: Diagnosis not present

## 2022-06-16 DIAGNOSIS — G4733 Obstructive sleep apnea (adult) (pediatric): Secondary | ICD-10-CM

## 2022-06-16 DIAGNOSIS — R0609 Other forms of dyspnea: Secondary | ICD-10-CM | POA: Diagnosis not present

## 2022-06-16 DIAGNOSIS — R0600 Dyspnea, unspecified: Secondary | ICD-10-CM | POA: Diagnosis not present

## 2022-06-16 DIAGNOSIS — K449 Diaphragmatic hernia without obstruction or gangrene: Secondary | ICD-10-CM | POA: Diagnosis not present

## 2022-06-16 NOTE — Patient Instructions (Signed)
Order- CXR   dx Dyspnea on exertion  We can continue CPAP auto 7-15

## 2022-06-24 ENCOUNTER — Encounter (INDEPENDENT_AMBULATORY_CARE_PROVIDER_SITE_OTHER): Payer: PPO | Admitting: Ophthalmology

## 2022-06-24 DIAGNOSIS — H35033 Hypertensive retinopathy, bilateral: Secondary | ICD-10-CM

## 2022-06-24 DIAGNOSIS — H35372 Puckering of macula, left eye: Secondary | ICD-10-CM

## 2022-06-24 DIAGNOSIS — H43813 Vitreous degeneration, bilateral: Secondary | ICD-10-CM

## 2022-06-24 DIAGNOSIS — H353231 Exudative age-related macular degeneration, bilateral, with active choroidal neovascularization: Secondary | ICD-10-CM | POA: Diagnosis not present

## 2022-06-24 DIAGNOSIS — I10 Essential (primary) hypertension: Secondary | ICD-10-CM | POA: Diagnosis not present

## 2022-06-30 NOTE — Progress Notes (Signed)
Remote pacemaker transmission.   

## 2022-07-08 ENCOUNTER — Encounter: Payer: Self-pay | Admitting: Internal Medicine

## 2022-07-08 NOTE — Assessment & Plan Note (Signed)
This seems to be mild, intermittent and nonprogressive.  Most likely reflecting upper airway cough syndrome and mild intermittent reflux/LPR.  Reflux precautions reemphasized.

## 2022-07-08 NOTE — Assessment & Plan Note (Signed)
Benefits from CPAP.  Encouraged to continue. Plan-continue auto 7-15

## 2022-07-14 DIAGNOSIS — R040 Epistaxis: Secondary | ICD-10-CM | POA: Diagnosis not present

## 2022-07-15 DIAGNOSIS — G4733 Obstructive sleep apnea (adult) (pediatric): Secondary | ICD-10-CM | POA: Diagnosis not present

## 2022-07-22 ENCOUNTER — Encounter (INDEPENDENT_AMBULATORY_CARE_PROVIDER_SITE_OTHER): Payer: PPO | Admitting: Ophthalmology

## 2022-07-22 DIAGNOSIS — H353231 Exudative age-related macular degeneration, bilateral, with active choroidal neovascularization: Secondary | ICD-10-CM | POA: Diagnosis not present

## 2022-07-22 DIAGNOSIS — H35033 Hypertensive retinopathy, bilateral: Secondary | ICD-10-CM

## 2022-07-22 DIAGNOSIS — I10 Essential (primary) hypertension: Secondary | ICD-10-CM

## 2022-07-22 DIAGNOSIS — H43813 Vitreous degeneration, bilateral: Secondary | ICD-10-CM

## 2022-07-24 DIAGNOSIS — L720 Epidermal cyst: Secondary | ICD-10-CM | POA: Diagnosis not present

## 2022-07-24 DIAGNOSIS — Z85828 Personal history of other malignant neoplasm of skin: Secondary | ICD-10-CM | POA: Diagnosis not present

## 2022-07-24 DIAGNOSIS — L308 Other specified dermatitis: Secondary | ICD-10-CM | POA: Diagnosis not present

## 2022-07-24 DIAGNOSIS — L570 Actinic keratosis: Secondary | ICD-10-CM | POA: Diagnosis not present

## 2022-08-05 ENCOUNTER — Telehealth: Payer: Self-pay | Admitting: Cardiovascular Disease

## 2022-08-05 NOTE — Telephone Encounter (Signed)
Pt wears a medical guardian watch, she would like to switch to the necklace. By the company of the watch, she has been told that she needs a note from her doctor stating it is ok. She thinks this may be because the necklace could interfere with her pacemaker. She says that if Dr. Nelly Laurence thinks the necklace will interfere with her pacemaker, she doesn't want to switch and will be ok with sticking with the watch. Per pt - ok to leave detailed message if she don't answer.

## 2022-08-06 NOTE — Telephone Encounter (Signed)
Advised patient she will need to call [employer] and provide information about the specific necklace. Phone number given to pt. Appreciative of call.

## 2022-08-15 DIAGNOSIS — G4733 Obstructive sleep apnea (adult) (pediatric): Secondary | ICD-10-CM | POA: Diagnosis not present

## 2022-08-19 ENCOUNTER — Encounter (INDEPENDENT_AMBULATORY_CARE_PROVIDER_SITE_OTHER): Payer: PPO | Admitting: Ophthalmology

## 2022-08-19 DIAGNOSIS — I10 Essential (primary) hypertension: Secondary | ICD-10-CM

## 2022-08-19 DIAGNOSIS — H35033 Hypertensive retinopathy, bilateral: Secondary | ICD-10-CM | POA: Diagnosis not present

## 2022-08-19 DIAGNOSIS — H43813 Vitreous degeneration, bilateral: Secondary | ICD-10-CM

## 2022-08-19 DIAGNOSIS — H353231 Exudative age-related macular degeneration, bilateral, with active choroidal neovascularization: Secondary | ICD-10-CM | POA: Diagnosis not present

## 2022-08-24 DIAGNOSIS — G4733 Obstructive sleep apnea (adult) (pediatric): Secondary | ICD-10-CM | POA: Diagnosis not present

## 2022-08-24 DIAGNOSIS — J310 Chronic rhinitis: Secondary | ICD-10-CM | POA: Diagnosis not present

## 2022-08-24 DIAGNOSIS — J441 Chronic obstructive pulmonary disease with (acute) exacerbation: Secondary | ICD-10-CM | POA: Diagnosis not present

## 2022-09-03 ENCOUNTER — Ambulatory Visit (INDEPENDENT_AMBULATORY_CARE_PROVIDER_SITE_OTHER): Payer: PPO

## 2022-09-03 DIAGNOSIS — I441 Atrioventricular block, second degree: Secondary | ICD-10-CM

## 2022-09-03 LAB — CUP PACEART REMOTE DEVICE CHECK
Battery Remaining Longevity: 109 mo
Battery Voltage: 3.01 V
Brady Statistic AP VP Percent: 14.98 %
Brady Statistic AP VS Percent: 0 %
Brady Statistic AS VP Percent: 85.02 %
Brady Statistic AS VS Percent: 0.01 %
Brady Statistic RA Percent Paced: 14.99 %
Brady Statistic RV Percent Paced: 99.99 %
Date Time Interrogation Session: 20240516032440
Implantable Lead Connection Status: 753985
Implantable Lead Connection Status: 753985
Implantable Lead Implant Date: 20100217
Implantable Lead Implant Date: 20100217
Implantable Lead Location: 753859
Implantable Lead Location: 753860
Implantable Lead Model: 5076
Implantable Lead Model: 5076
Implantable Pulse Generator Implant Date: 20220816
Lead Channel Impedance Value: 380 Ohm
Lead Channel Impedance Value: 399 Ohm
Lead Channel Impedance Value: 437 Ohm
Lead Channel Impedance Value: 437 Ohm
Lead Channel Pacing Threshold Amplitude: 0.375 V
Lead Channel Pacing Threshold Amplitude: 0.75 V
Lead Channel Pacing Threshold Pulse Width: 0.4 ms
Lead Channel Pacing Threshold Pulse Width: 0.4 ms
Lead Channel Sensing Intrinsic Amplitude: 1.625 mV
Lead Channel Sensing Intrinsic Amplitude: 1.625 mV
Lead Channel Sensing Intrinsic Amplitude: 22.25 mV
Lead Channel Sensing Intrinsic Amplitude: 22.25 mV
Lead Channel Setting Pacing Amplitude: 1.5 V
Lead Channel Setting Pacing Amplitude: 2.5 V
Lead Channel Setting Pacing Pulse Width: 0.4 ms
Lead Channel Setting Sensing Sensitivity: 4 mV
Zone Setting Status: 755011

## 2022-09-14 DIAGNOSIS — G4733 Obstructive sleep apnea (adult) (pediatric): Secondary | ICD-10-CM | POA: Diagnosis not present

## 2022-09-16 ENCOUNTER — Encounter (INDEPENDENT_AMBULATORY_CARE_PROVIDER_SITE_OTHER): Payer: PPO | Admitting: Ophthalmology

## 2022-09-16 DIAGNOSIS — H35033 Hypertensive retinopathy, bilateral: Secondary | ICD-10-CM | POA: Diagnosis not present

## 2022-09-16 DIAGNOSIS — H43813 Vitreous degeneration, bilateral: Secondary | ICD-10-CM

## 2022-09-16 DIAGNOSIS — H353231 Exudative age-related macular degeneration, bilateral, with active choroidal neovascularization: Secondary | ICD-10-CM

## 2022-09-16 DIAGNOSIS — I10 Essential (primary) hypertension: Secondary | ICD-10-CM

## 2022-09-17 NOTE — Progress Notes (Signed)
Remote pacemaker transmission.   

## 2022-09-21 DIAGNOSIS — H35373 Puckering of macula, bilateral: Secondary | ICD-10-CM | POA: Diagnosis not present

## 2022-09-21 DIAGNOSIS — H35723 Serous detachment of retinal pigment epithelium, bilateral: Secondary | ICD-10-CM | POA: Diagnosis not present

## 2022-09-21 DIAGNOSIS — H524 Presbyopia: Secondary | ICD-10-CM | POA: Diagnosis not present

## 2022-09-21 DIAGNOSIS — H353231 Exudative age-related macular degeneration, bilateral, with active choroidal neovascularization: Secondary | ICD-10-CM | POA: Diagnosis not present

## 2022-09-21 DIAGNOSIS — H35033 Hypertensive retinopathy, bilateral: Secondary | ICD-10-CM | POA: Diagnosis not present

## 2022-10-14 ENCOUNTER — Encounter (INDEPENDENT_AMBULATORY_CARE_PROVIDER_SITE_OTHER): Payer: PPO | Admitting: Ophthalmology

## 2022-10-14 DIAGNOSIS — H353231 Exudative age-related macular degeneration, bilateral, with active choroidal neovascularization: Secondary | ICD-10-CM

## 2022-10-14 DIAGNOSIS — I10 Essential (primary) hypertension: Secondary | ICD-10-CM | POA: Diagnosis not present

## 2022-10-14 DIAGNOSIS — H35033 Hypertensive retinopathy, bilateral: Secondary | ICD-10-CM | POA: Diagnosis not present

## 2022-10-14 DIAGNOSIS — H43813 Vitreous degeneration, bilateral: Secondary | ICD-10-CM | POA: Diagnosis not present

## 2022-10-15 DIAGNOSIS — G4733 Obstructive sleep apnea (adult) (pediatric): Secondary | ICD-10-CM | POA: Diagnosis not present

## 2022-11-03 DIAGNOSIS — E785 Hyperlipidemia, unspecified: Secondary | ICD-10-CM | POA: Diagnosis not present

## 2022-11-03 DIAGNOSIS — R7301 Impaired fasting glucose: Secondary | ICD-10-CM | POA: Diagnosis not present

## 2022-11-03 DIAGNOSIS — Z0000 Encounter for general adult medical examination without abnormal findings: Secondary | ICD-10-CM | POA: Diagnosis not present

## 2022-11-03 DIAGNOSIS — I10 Essential (primary) hypertension: Secondary | ICD-10-CM | POA: Diagnosis not present

## 2022-11-03 DIAGNOSIS — E7849 Other hyperlipidemia: Secondary | ICD-10-CM | POA: Diagnosis not present

## 2022-11-10 DIAGNOSIS — R0609 Other forms of dyspnea: Secondary | ICD-10-CM | POA: Diagnosis not present

## 2022-11-10 DIAGNOSIS — I129 Hypertensive chronic kidney disease with stage 1 through stage 4 chronic kidney disease, or unspecified chronic kidney disease: Secondary | ICD-10-CM | POA: Diagnosis not present

## 2022-11-10 DIAGNOSIS — R2681 Unsteadiness on feet: Secondary | ICD-10-CM | POA: Diagnosis not present

## 2022-11-10 DIAGNOSIS — Z950 Presence of cardiac pacemaker: Secondary | ICD-10-CM | POA: Diagnosis not present

## 2022-11-10 DIAGNOSIS — Z1331 Encounter for screening for depression: Secondary | ICD-10-CM | POA: Diagnosis not present

## 2022-11-10 DIAGNOSIS — Z6841 Body Mass Index (BMI) 40.0 and over, adult: Secondary | ICD-10-CM | POA: Diagnosis not present

## 2022-11-10 DIAGNOSIS — Z1339 Encounter for screening examination for other mental health and behavioral disorders: Secondary | ICD-10-CM | POA: Diagnosis not present

## 2022-11-10 DIAGNOSIS — R82998 Other abnormal findings in urine: Secondary | ICD-10-CM | POA: Diagnosis not present

## 2022-11-10 DIAGNOSIS — G4733 Obstructive sleep apnea (adult) (pediatric): Secondary | ICD-10-CM | POA: Diagnosis not present

## 2022-11-10 DIAGNOSIS — N3281 Overactive bladder: Secondary | ICD-10-CM | POA: Diagnosis not present

## 2022-11-10 DIAGNOSIS — Z0000 Encounter for general adult medical examination without abnormal findings: Secondary | ICD-10-CM | POA: Diagnosis not present

## 2022-11-10 DIAGNOSIS — N1831 Chronic kidney disease, stage 3a: Secondary | ICD-10-CM | POA: Diagnosis not present

## 2022-11-10 DIAGNOSIS — E785 Hyperlipidemia, unspecified: Secondary | ICD-10-CM | POA: Diagnosis not present

## 2022-11-11 ENCOUNTER — Encounter (INDEPENDENT_AMBULATORY_CARE_PROVIDER_SITE_OTHER): Payer: PPO | Admitting: Ophthalmology

## 2022-11-11 DIAGNOSIS — H43813 Vitreous degeneration, bilateral: Secondary | ICD-10-CM

## 2022-11-11 DIAGNOSIS — H353231 Exudative age-related macular degeneration, bilateral, with active choroidal neovascularization: Secondary | ICD-10-CM | POA: Diagnosis not present

## 2022-11-11 DIAGNOSIS — H35033 Hypertensive retinopathy, bilateral: Secondary | ICD-10-CM

## 2022-11-11 DIAGNOSIS — I10 Essential (primary) hypertension: Secondary | ICD-10-CM | POA: Diagnosis not present

## 2022-11-14 DIAGNOSIS — G4733 Obstructive sleep apnea (adult) (pediatric): Secondary | ICD-10-CM | POA: Diagnosis not present

## 2022-12-03 ENCOUNTER — Ambulatory Visit (INDEPENDENT_AMBULATORY_CARE_PROVIDER_SITE_OTHER): Payer: PPO

## 2022-12-03 DIAGNOSIS — I441 Atrioventricular block, second degree: Secondary | ICD-10-CM

## 2022-12-03 DIAGNOSIS — G4733 Obstructive sleep apnea (adult) (pediatric): Secondary | ICD-10-CM | POA: Diagnosis not present

## 2022-12-03 LAB — CUP PACEART REMOTE DEVICE CHECK
Battery Remaining Longevity: 107 mo
Battery Voltage: 3.01 V
Brady Statistic AP VP Percent: 12.29 %
Brady Statistic AP VS Percent: 0 %
Brady Statistic AS VP Percent: 87.7 %
Brady Statistic AS VS Percent: 0.01 %
Brady Statistic RA Percent Paced: 12.3 %
Brady Statistic RV Percent Paced: 99.99 %
Date Time Interrogation Session: 20240815001220
Implantable Lead Connection Status: 753985
Implantable Lead Connection Status: 753985
Implantable Lead Implant Date: 20100217
Implantable Lead Implant Date: 20100217
Implantable Lead Location: 753859
Implantable Lead Location: 753860
Implantable Lead Model: 5076
Implantable Lead Model: 5076
Implantable Pulse Generator Implant Date: 20220816
Lead Channel Impedance Value: 361 Ohm
Lead Channel Impedance Value: 380 Ohm
Lead Channel Impedance Value: 418 Ohm
Lead Channel Impedance Value: 456 Ohm
Lead Channel Pacing Threshold Amplitude: 0.5 V
Lead Channel Pacing Threshold Amplitude: 0.875 V
Lead Channel Pacing Threshold Pulse Width: 0.4 ms
Lead Channel Pacing Threshold Pulse Width: 0.4 ms
Lead Channel Sensing Intrinsic Amplitude: 1.75 mV
Lead Channel Sensing Intrinsic Amplitude: 1.75 mV
Lead Channel Sensing Intrinsic Amplitude: 22.25 mV
Lead Channel Sensing Intrinsic Amplitude: 22.25 mV
Lead Channel Setting Pacing Amplitude: 2 V
Lead Channel Setting Pacing Amplitude: 2.5 V
Lead Channel Setting Pacing Pulse Width: 0.4 ms
Lead Channel Setting Sensing Sensitivity: 4 mV
Zone Setting Status: 755011

## 2022-12-09 ENCOUNTER — Encounter (INDEPENDENT_AMBULATORY_CARE_PROVIDER_SITE_OTHER): Payer: PPO | Admitting: Ophthalmology

## 2022-12-09 DIAGNOSIS — H43813 Vitreous degeneration, bilateral: Secondary | ICD-10-CM | POA: Diagnosis not present

## 2022-12-09 DIAGNOSIS — H35033 Hypertensive retinopathy, bilateral: Secondary | ICD-10-CM | POA: Diagnosis not present

## 2022-12-09 DIAGNOSIS — I10 Essential (primary) hypertension: Secondary | ICD-10-CM | POA: Diagnosis not present

## 2022-12-09 DIAGNOSIS — H353231 Exudative age-related macular degeneration, bilateral, with active choroidal neovascularization: Secondary | ICD-10-CM

## 2022-12-15 ENCOUNTER — Encounter (INDEPENDENT_AMBULATORY_CARE_PROVIDER_SITE_OTHER): Payer: PPO | Admitting: Ophthalmology

## 2022-12-15 DIAGNOSIS — G4733 Obstructive sleep apnea (adult) (pediatric): Secondary | ICD-10-CM | POA: Diagnosis not present

## 2022-12-16 NOTE — Progress Notes (Signed)
Remote pacemaker transmission.   

## 2023-01-05 DIAGNOSIS — H524 Presbyopia: Secondary | ICD-10-CM | POA: Diagnosis not present

## 2023-01-06 ENCOUNTER — Encounter (INDEPENDENT_AMBULATORY_CARE_PROVIDER_SITE_OTHER): Payer: PPO | Admitting: Ophthalmology

## 2023-01-06 DIAGNOSIS — H353231 Exudative age-related macular degeneration, bilateral, with active choroidal neovascularization: Secondary | ICD-10-CM | POA: Diagnosis not present

## 2023-01-06 DIAGNOSIS — H35033 Hypertensive retinopathy, bilateral: Secondary | ICD-10-CM | POA: Diagnosis not present

## 2023-01-06 DIAGNOSIS — I10 Essential (primary) hypertension: Secondary | ICD-10-CM

## 2023-01-06 DIAGNOSIS — H43813 Vitreous degeneration, bilateral: Secondary | ICD-10-CM | POA: Diagnosis not present

## 2023-01-18 DIAGNOSIS — Z85828 Personal history of other malignant neoplasm of skin: Secondary | ICD-10-CM | POA: Diagnosis not present

## 2023-01-18 DIAGNOSIS — L821 Other seborrheic keratosis: Secondary | ICD-10-CM | POA: Diagnosis not present

## 2023-01-18 DIAGNOSIS — L814 Other melanin hyperpigmentation: Secondary | ICD-10-CM | POA: Diagnosis not present

## 2023-01-18 DIAGNOSIS — L820 Inflamed seborrheic keratosis: Secondary | ICD-10-CM | POA: Diagnosis not present

## 2023-01-18 DIAGNOSIS — L570 Actinic keratosis: Secondary | ICD-10-CM | POA: Diagnosis not present

## 2023-01-18 DIAGNOSIS — D692 Other nonthrombocytopenic purpura: Secondary | ICD-10-CM | POA: Diagnosis not present

## 2023-01-18 DIAGNOSIS — L308 Other specified dermatitis: Secondary | ICD-10-CM | POA: Diagnosis not present

## 2023-01-18 DIAGNOSIS — L300 Nummular dermatitis: Secondary | ICD-10-CM | POA: Diagnosis not present

## 2023-01-23 DIAGNOSIS — Z23 Encounter for immunization: Secondary | ICD-10-CM | POA: Diagnosis not present

## 2023-02-10 ENCOUNTER — Encounter (INDEPENDENT_AMBULATORY_CARE_PROVIDER_SITE_OTHER): Payer: PPO | Admitting: Ophthalmology

## 2023-02-10 DIAGNOSIS — H353231 Exudative age-related macular degeneration, bilateral, with active choroidal neovascularization: Secondary | ICD-10-CM

## 2023-02-10 DIAGNOSIS — H43813 Vitreous degeneration, bilateral: Secondary | ICD-10-CM

## 2023-02-10 DIAGNOSIS — I10 Essential (primary) hypertension: Secondary | ICD-10-CM | POA: Diagnosis not present

## 2023-02-10 DIAGNOSIS — H35033 Hypertensive retinopathy, bilateral: Secondary | ICD-10-CM | POA: Diagnosis not present

## 2023-02-12 ENCOUNTER — Other Ambulatory Visit: Payer: Self-pay | Admitting: Internal Medicine

## 2023-02-12 DIAGNOSIS — Z1231 Encounter for screening mammogram for malignant neoplasm of breast: Secondary | ICD-10-CM

## 2023-03-04 ENCOUNTER — Ambulatory Visit (INDEPENDENT_AMBULATORY_CARE_PROVIDER_SITE_OTHER): Payer: PPO

## 2023-03-04 DIAGNOSIS — I441 Atrioventricular block, second degree: Secondary | ICD-10-CM

## 2023-03-05 LAB — CUP PACEART REMOTE DEVICE CHECK
Battery Remaining Longevity: 103 mo
Battery Voltage: 3.01 V
Brady Statistic AP VP Percent: 11.75 %
Brady Statistic AP VS Percent: 0 %
Brady Statistic AS VP Percent: 88.16 %
Brady Statistic AS VS Percent: 0.09 %
Brady Statistic RA Percent Paced: 11.78 %
Brady Statistic RV Percent Paced: 99.91 %
Date Time Interrogation Session: 20241114045810
Implantable Lead Connection Status: 753985
Implantable Lead Connection Status: 753985
Implantable Lead Implant Date: 20100217
Implantable Lead Implant Date: 20100217
Implantable Lead Location: 753859
Implantable Lead Location: 753860
Implantable Lead Model: 5076
Implantable Lead Model: 5076
Implantable Pulse Generator Implant Date: 20220816
Lead Channel Impedance Value: 361 Ohm
Lead Channel Impedance Value: 380 Ohm
Lead Channel Impedance Value: 418 Ohm
Lead Channel Impedance Value: 437 Ohm
Lead Channel Pacing Threshold Amplitude: 0.5 V
Lead Channel Pacing Threshold Amplitude: 0.875 V
Lead Channel Pacing Threshold Pulse Width: 0.4 ms
Lead Channel Pacing Threshold Pulse Width: 0.4 ms
Lead Channel Sensing Intrinsic Amplitude: 1.5 mV
Lead Channel Sensing Intrinsic Amplitude: 1.5 mV
Lead Channel Sensing Intrinsic Amplitude: 13.25 mV
Lead Channel Sensing Intrinsic Amplitude: 13.25 mV
Lead Channel Setting Pacing Amplitude: 1.75 V
Lead Channel Setting Pacing Amplitude: 2.5 V
Lead Channel Setting Pacing Pulse Width: 0.4 ms
Lead Channel Setting Sensing Sensitivity: 4 mV
Zone Setting Status: 755011

## 2023-03-11 ENCOUNTER — Ambulatory Visit
Admission: RE | Admit: 2023-03-11 | Discharge: 2023-03-11 | Disposition: A | Discharge status: Home / Self Care | Payer: PPO | Source: Ambulatory Visit | Attending: Internal Medicine | Admitting: Internal Medicine

## 2023-03-11 DIAGNOSIS — Z1231 Encounter for screening mammogram for malignant neoplasm of breast: Secondary | ICD-10-CM

## 2023-03-16 ENCOUNTER — Encounter (INDEPENDENT_AMBULATORY_CARE_PROVIDER_SITE_OTHER): Payer: PPO | Admitting: Ophthalmology

## 2023-03-16 DIAGNOSIS — I10 Essential (primary) hypertension: Secondary | ICD-10-CM | POA: Diagnosis not present

## 2023-03-16 DIAGNOSIS — H35033 Hypertensive retinopathy, bilateral: Secondary | ICD-10-CM

## 2023-03-16 DIAGNOSIS — H43813 Vitreous degeneration, bilateral: Secondary | ICD-10-CM

## 2023-03-16 DIAGNOSIS — H353231 Exudative age-related macular degeneration, bilateral, with active choroidal neovascularization: Secondary | ICD-10-CM | POA: Diagnosis not present

## 2023-03-17 NOTE — Progress Notes (Signed)
Remote pacemaker transmission.   

## 2023-04-12 ENCOUNTER — Encounter (INDEPENDENT_AMBULATORY_CARE_PROVIDER_SITE_OTHER): Payer: PPO | Admitting: Ophthalmology

## 2023-04-12 DIAGNOSIS — H35033 Hypertensive retinopathy, bilateral: Secondary | ICD-10-CM

## 2023-04-12 DIAGNOSIS — H353231 Exudative age-related macular degeneration, bilateral, with active choroidal neovascularization: Secondary | ICD-10-CM | POA: Diagnosis not present

## 2023-04-12 DIAGNOSIS — I10 Essential (primary) hypertension: Secondary | ICD-10-CM | POA: Diagnosis not present

## 2023-04-12 DIAGNOSIS — H43813 Vitreous degeneration, bilateral: Secondary | ICD-10-CM

## 2023-05-11 ENCOUNTER — Encounter (INDEPENDENT_AMBULATORY_CARE_PROVIDER_SITE_OTHER): Payer: PPO | Admitting: Ophthalmology

## 2023-05-11 DIAGNOSIS — H35033 Hypertensive retinopathy, bilateral: Secondary | ICD-10-CM | POA: Diagnosis not present

## 2023-05-11 DIAGNOSIS — I10 Essential (primary) hypertension: Secondary | ICD-10-CM

## 2023-05-11 DIAGNOSIS — H353231 Exudative age-related macular degeneration, bilateral, with active choroidal neovascularization: Secondary | ICD-10-CM

## 2023-05-11 DIAGNOSIS — H43813 Vitreous degeneration, bilateral: Secondary | ICD-10-CM | POA: Diagnosis not present

## 2023-05-14 ENCOUNTER — Ambulatory Visit (HOSPITAL_BASED_OUTPATIENT_CLINIC_OR_DEPARTMENT_OTHER): Payer: PPO | Admitting: Cardiology

## 2023-06-03 ENCOUNTER — Ambulatory Visit (INDEPENDENT_AMBULATORY_CARE_PROVIDER_SITE_OTHER): Payer: PPO

## 2023-06-03 DIAGNOSIS — I441 Atrioventricular block, second degree: Secondary | ICD-10-CM

## 2023-06-05 LAB — CUP PACEART REMOTE DEVICE CHECK
Battery Remaining Longevity: 101 mo
Battery Voltage: 3 V
Brady Statistic AP VP Percent: 9.38 %
Brady Statistic AP VS Percent: 0 %
Brady Statistic AS VP Percent: 90.56 %
Brady Statistic AS VS Percent: 0.06 %
Brady Statistic RA Percent Paced: 9.43 %
Brady Statistic RV Percent Paced: 99.94 %
Date Time Interrogation Session: 20250212183848
Implantable Lead Connection Status: 753985
Implantable Lead Connection Status: 753985
Implantable Lead Implant Date: 20100217
Implantable Lead Implant Date: 20100217
Implantable Lead Location: 753859
Implantable Lead Location: 753860
Implantable Lead Model: 5076
Implantable Lead Model: 5076
Implantable Pulse Generator Implant Date: 20220816
Lead Channel Impedance Value: 361 Ohm
Lead Channel Impedance Value: 380 Ohm
Lead Channel Impedance Value: 418 Ohm
Lead Channel Impedance Value: 456 Ohm
Lead Channel Pacing Threshold Amplitude: 0.375 V
Lead Channel Pacing Threshold Amplitude: 0.75 V
Lead Channel Pacing Threshold Pulse Width: 0.4 ms
Lead Channel Pacing Threshold Pulse Width: 0.4 ms
Lead Channel Sensing Intrinsic Amplitude: 1.875 mV
Lead Channel Sensing Intrinsic Amplitude: 1.875 mV
Lead Channel Sensing Intrinsic Amplitude: 10.125 mV
Lead Channel Sensing Intrinsic Amplitude: 10.125 mV
Lead Channel Setting Pacing Amplitude: 1.5 V
Lead Channel Setting Pacing Amplitude: 2.5 V
Lead Channel Setting Pacing Pulse Width: 0.4 ms
Lead Channel Setting Sensing Sensitivity: 4 mV
Zone Setting Status: 755011

## 2023-06-07 DIAGNOSIS — G4733 Obstructive sleep apnea (adult) (pediatric): Secondary | ICD-10-CM | POA: Diagnosis not present

## 2023-06-07 DIAGNOSIS — I129 Hypertensive chronic kidney disease with stage 1 through stage 4 chronic kidney disease, or unspecified chronic kidney disease: Secondary | ICD-10-CM | POA: Diagnosis not present

## 2023-06-07 DIAGNOSIS — N3281 Overactive bladder: Secondary | ICD-10-CM | POA: Diagnosis not present

## 2023-06-07 DIAGNOSIS — R2681 Unsteadiness on feet: Secondary | ICD-10-CM | POA: Diagnosis not present

## 2023-06-08 ENCOUNTER — Encounter (INDEPENDENT_AMBULATORY_CARE_PROVIDER_SITE_OTHER): Payer: PPO | Admitting: Ophthalmology

## 2023-06-08 ENCOUNTER — Encounter: Payer: PPO | Admitting: Cardiovascular Disease

## 2023-06-08 DIAGNOSIS — H35033 Hypertensive retinopathy, bilateral: Secondary | ICD-10-CM

## 2023-06-08 DIAGNOSIS — H353231 Exudative age-related macular degeneration, bilateral, with active choroidal neovascularization: Secondary | ICD-10-CM | POA: Diagnosis not present

## 2023-06-08 DIAGNOSIS — I10 Essential (primary) hypertension: Secondary | ICD-10-CM

## 2023-06-08 DIAGNOSIS — H43813 Vitreous degeneration, bilateral: Secondary | ICD-10-CM

## 2023-06-14 ENCOUNTER — Ambulatory Visit: Payer: PPO | Attending: Cardiovascular Disease | Admitting: Cardiovascular Disease

## 2023-06-14 ENCOUNTER — Encounter: Payer: Self-pay | Admitting: Cardiovascular Disease

## 2023-06-14 VITALS — BP 124/60 | HR 93 | Ht 60.0 in | Wt 200.4 lb

## 2023-06-14 DIAGNOSIS — Z950 Presence of cardiac pacemaker: Secondary | ICD-10-CM

## 2023-06-14 DIAGNOSIS — I441 Atrioventricular block, second degree: Secondary | ICD-10-CM

## 2023-06-14 NOTE — Progress Notes (Signed)
  Electrophysiology Office Note:    Date:  06/14/2023   ID:  BONNY VANLEEUWEN, DOB 11-29-1934, MRN [id number]  PCP:  Garlan Fillers, MD   [facility] Providers Cardiologist:  Jodelle Red, MD Electrophysiologist:  Maurice Small, MD     Referring MD: Garlan Fillers, MD   History of Present Illness:    [name] is a 89 y.o. female with a medical history significant for Mobitz 2 AV block with [employer] Azure dual-chamber pacemaker in place, who presents for device follow-up.Marland Kitchen     She has a [employer] dual-chamber pacemaker.  She underwent generator change in August 2022      she has no device related complaints -- no new tenderness, drainage, redness.   EKGs/Labs/Other Studies Reviewed Today:     Echocardiogram:  TTE September 2022 LVEF 65 to 70%.     EKG:   EKG Interpretation Date/Time:  Monday June 14 2023 14:59:47 EST Ventricular Rate:  93 PR Interval:  232 QRS Duration:  154 QT Interval:  400 QTC Calculation: 497 R Axis:   115  Text Interpretation: Atrial-sensed ventricular-paced rhythm with prolonged AV conduction When compared with ECG of 03-Dec-2020 16:52, No signfiicant change Confirmed by York Pellant [phone]) on 06/14/2023 3:22:55 PM     Physical Exam:    VS:  BP 124/60 (BP Location: Left Arm, Patient Position: Sitting, Cuff Size: Large)   Pulse 93   Ht 5' (1.524 m)   Wt 200 lb 6.4 oz (90.9 kg)   SpO2 96%   BMI 39.14 kg/m     Wt Readings from Last 3 Encounters:  06/14/23 200 lb 6.4 oz (90.9 kg)  06/16/22 206 lb 9.6 oz (93.7 kg)  04/10/22 207 lb (93.9 kg)     GEN: Well nourished, well developed in no acute distress CARDIAC: RRR, no murmurs, rubs, gallops The device site is normal -- no tenderness, edema, drainage, redness, threatened erosion.  RESPIRATORY:  Normal work of breathing MUSCULOSKELETAL: no edema    ASSESSMENT & PLAN:     History of complete heart block Medtronic dual-chamber pacemaker  in place I reviewed today's device interrogation.  See Paceart for details Normal device function; 8 years battery remaining She is pacemaker dependent today     Signed, Maurice Small, MD  06/14/2023 3:32 PM    [facility]

## 2023-06-14 NOTE — Patient Instructions (Signed)

## 2023-06-16 NOTE — Progress Notes (Unsigned)
 Patient ID: [name], female    DOB: 05/29/1934, 89 y.o.   MRN: [id number]  HPI Female never smoker followed for cough/ GERD, OSA/CPAP, complicated by HBP, Mobitz2AVB/ Pacemaker NPSG 11/14/00- AHI 22/ hr, desaturation to 84% PFT at [facility] 03/18/10 showed mild reversible small  airway obstruction  -------------------------------------------------------------------------------------------------------.   06/16/22- 89 year old female never smoker followed for OSA/CPAP, Cough/ GERD,  complicated by HBP, PAFib/Mobitz2AVB/ pacemaker CPAP auto 7-15/ [facility] Download- compliance 100%, AHI 1.2/ hr Body weight today-206 lbs Covid vax-4 Phizer                         Flu vax-had New machine working well.  Download reviewed with her.  No concerns. She has had episodes of epistaxis from left nostril for which she is seeing Dr. Darlyn Read. Some dyspnea on exertion-nothing acute.  Occasional dry cough.  No wheezing.  BMI on arrival today 40.3.  06/17/23-89 year old female never smoker followed for OSA/CPAP, Cough/ GERD,  complicated by HBP, PAFib/Mobitz2AVB/ pacemaker CPAP auto 7-15/ [facility] Download- compliance 100%, AHI 0.6 Body weight today- -----Wearing CPAP-doing good. Wearing 7-8 hrs. Each night [name] gave her updated Covid and unspecified Pneumonia Vaccines in Jan, 2025. Discussed the use of AI scribe software for clinical note transcription with the patient, who gave verbal consent to proceed.  History of Present Illness   The patient, with a history of sleep apnea managed with CPAP and a pacemaker, presents with recurrent nosebleeds. She reports that the nosebleeds began about a month ago while cleaning her nose and have occurred two or three times since then. The patient is hesitant to blow her nose for fear of inducing a nosebleed. She believes the bleeding is originating from the front of the nose. The patient had a similar issue last year, which was managed by cauterization by  an ENT specialist. She has return appointment in March. She also reports that she uses a humidifier with her CPAP machine, and she has noticed that the tank is always empty in the morning.  The patient also mentions that her son has noticed her frequently clearing her throat, which she was not previously aware of. She is unsure if this is due to a sensation in her throat or if it has become a habit.    CXR 06/16/22-  IMPRESSION: 1. No active cardiopulmonary disease. 2. Small hiatal hernia.  Review of Systems-See HPI + = positive Constitutional:   No-   weight loss, night sweats, fevers, chills, fatigue, lassitude. HEENT:   No-  headaches, difficulty swallowing, tooth/dental problems, sore throat,       No-  sneezing, itching, ear ache, nasal congestion, post nasal drip,  CV:  No-   chest pain, orthopnea, PND, swelling in lower extremities, anasarca, as dizziness, palpitations Resp: +shortness of breath with exertion or at rest.               productive cough,  + non-productive cough,  No- coughing up of blood.               change in color of mucus.  No- wheezing.   Skin: No-   rash or lesions. GI:  No-   heartburn, indigestion, abdominal pain, nausea, vomiting,  GU:  MS:  No-   joint pain or swelling.   Neuro-     nothing unusual Psych:  No- change in mood or affect. No depression or anxiety.  No memory loss.    Objective:  Physical Exam General- Alert, Oriented, Affect-appropriate, Distress- none acute, + obese, +elderly Skin- rash-none, lesions- none, excoriation- none Lymphadenopathy- none Head- atraumatic            Eyes- Gross vision intact, PERRLA, conjunctivae clear secretions            Ears- Hearing, canals-normal            Nose- Clear, no-Septal dev, mucosa clear, polyps, erosion, perforation             Throat- Mallampati III-IV , mucosa+drainage- none, tonsils- atrophic Neck- flexible , trachea midline, no stridor , thyroid nl, carotid no bruit Chest - symmetrical  excursion , unlabored           Heart/CV- RRR , no murmur , no gallop  , no rub, nl s1 s2                           - JVD- none , edema- none, stasis changes- none, varices- none           Lung-  wheeze+mild  cough- none , dullness-none, rub- none.            Chest wall-+ L pacemaker Abd-  Br/ Gen/ Rectal- Not done, not indicated Extrem- cyanosis- none, clubbing, none, atrophy- none, strength- nl Neuro- grossly intact to observation  Assessment and Plan:    Obstructive Sleep Apnea Well controlled with nightly CPAP use. No changes needed. -Continue current CPAP settings and nightly use.  Nasal Bleeding Recurrent nosebleeds, possibly related to CPAP use and dry air. Previously cauterized by ENT. -Try nasal saline gel before CPAP use to moisturize nasal passages. -Consult with Dr. Pollyann Kennedy (ENT) for further evaluation and possible cauterization.  Throat Clearing Frequent throat clearing, possibly due to postnasal drip or reflux. -Try sugar-free hard candies to soothe throat. -Discuss with Dr. Pollyann Kennedy during ENT visit.

## 2023-06-17 ENCOUNTER — Encounter: Payer: Self-pay | Admitting: Internal Medicine

## 2023-06-17 ENCOUNTER — Ambulatory Visit: Payer: PPO | Admitting: Internal Medicine

## 2023-06-17 VITALS — BP 128/60 | HR 76 | Temp 97.5°F | Ht 60.0 in | Wt 201.8 lb

## 2023-06-17 DIAGNOSIS — R040 Epistaxis: Secondary | ICD-10-CM

## 2023-06-17 DIAGNOSIS — G4733 Obstructive sleep apnea (adult) (pediatric): Secondary | ICD-10-CM | POA: Diagnosis not present

## 2023-06-17 NOTE — Patient Instructions (Signed)
 Try the samples of nasal saline gel- wipe a little in your nostril at bedtime. It can protect the lining of your nose and may reduce the nose bleeds. It is cheap over the counter at drug store if you find it helps.  Try "zero-Sugar Archie Balboa Rancher hard candies ([employer], [employer]) as throat lozenges to see if they reduce the throat clearing your son notices.    Be sure to tell Dr Pollyann Kennedy about the throat clearing as well as the nosebleeds when you see him.

## 2023-07-06 ENCOUNTER — Encounter (INDEPENDENT_AMBULATORY_CARE_PROVIDER_SITE_OTHER): Payer: PPO | Admitting: Ophthalmology

## 2023-07-06 DIAGNOSIS — H35033 Hypertensive retinopathy, bilateral: Secondary | ICD-10-CM | POA: Diagnosis not present

## 2023-07-06 DIAGNOSIS — I10 Essential (primary) hypertension: Secondary | ICD-10-CM | POA: Diagnosis not present

## 2023-07-06 DIAGNOSIS — H43813 Vitreous degeneration, bilateral: Secondary | ICD-10-CM | POA: Diagnosis not present

## 2023-07-06 DIAGNOSIS — H353231 Exudative age-related macular degeneration, bilateral, with active choroidal neovascularization: Secondary | ICD-10-CM | POA: Diagnosis not present

## 2023-07-06 NOTE — Progress Notes (Signed)
 Remote pacemaker transmission.

## 2023-07-06 NOTE — Addendum Note (Signed)
 Addended by: Elease Etienne A on: 07/06/2023 12:20 PM   Modules accepted: Orders

## 2023-07-08 DIAGNOSIS — R040 Epistaxis: Secondary | ICD-10-CM | POA: Diagnosis not present

## 2023-08-11 ENCOUNTER — Encounter (INDEPENDENT_AMBULATORY_CARE_PROVIDER_SITE_OTHER): Admitting: Ophthalmology

## 2023-08-11 DIAGNOSIS — H353231 Exudative age-related macular degeneration, bilateral, with active choroidal neovascularization: Secondary | ICD-10-CM

## 2023-08-11 DIAGNOSIS — H43813 Vitreous degeneration, bilateral: Secondary | ICD-10-CM

## 2023-08-11 DIAGNOSIS — H35033 Hypertensive retinopathy, bilateral: Secondary | ICD-10-CM

## 2023-08-11 DIAGNOSIS — I10 Essential (primary) hypertension: Secondary | ICD-10-CM | POA: Diagnosis not present

## 2023-08-17 DIAGNOSIS — G4733 Obstructive sleep apnea (adult) (pediatric): Secondary | ICD-10-CM | POA: Diagnosis not present

## 2023-09-02 ENCOUNTER — Ambulatory Visit (INDEPENDENT_AMBULATORY_CARE_PROVIDER_SITE_OTHER): Payer: PPO

## 2023-09-02 DIAGNOSIS — I441 Atrioventricular block, second degree: Secondary | ICD-10-CM | POA: Diagnosis not present

## 2023-09-02 LAB — CUP PACEART REMOTE DEVICE CHECK
Battery Remaining Longevity: 97 mo
Battery Voltage: 3 V
Brady Statistic AP VP Percent: 16.04 %
Brady Statistic AP VS Percent: 0 %
Brady Statistic AS VP Percent: 83.95 %
Brady Statistic AS VS Percent: 0.01 %
Brady Statistic RA Percent Paced: 16.07 %
Brady Statistic RV Percent Paced: 99.99 %
Date Time Interrogation Session: 20250514213844
Implantable Lead Connection Status: 753985
Implantable Lead Connection Status: 753985
Implantable Lead Implant Date: 20100217
Implantable Lead Implant Date: 20100217
Implantable Lead Location: 753859
Implantable Lead Location: 753860
Implantable Lead Model: 5076
Implantable Lead Model: 5076
Implantable Pulse Generator Implant Date: 20220816
Lead Channel Impedance Value: 380 Ohm
Lead Channel Impedance Value: 380 Ohm
Lead Channel Impedance Value: 437 Ohm
Lead Channel Impedance Value: 437 Ohm
Lead Channel Pacing Threshold Amplitude: 0.5 V
Lead Channel Pacing Threshold Amplitude: 0.875 V
Lead Channel Pacing Threshold Pulse Width: 0.4 ms
Lead Channel Pacing Threshold Pulse Width: 0.4 ms
Lead Channel Sensing Intrinsic Amplitude: 10.125 mV
Lead Channel Sensing Intrinsic Amplitude: 10.125 mV
Lead Channel Sensing Intrinsic Amplitude: 2.25 mV
Lead Channel Sensing Intrinsic Amplitude: 2.25 mV
Lead Channel Setting Pacing Amplitude: 1.75 V
Lead Channel Setting Pacing Amplitude: 2.5 V
Lead Channel Setting Pacing Pulse Width: 0.4 ms
Lead Channel Setting Sensing Sensitivity: 4 mV
Zone Setting Status: 755011

## 2023-09-03 ENCOUNTER — Ambulatory Visit: Payer: Self-pay | Admitting: Cardiovascular Disease

## 2023-09-08 ENCOUNTER — Encounter (INDEPENDENT_AMBULATORY_CARE_PROVIDER_SITE_OTHER): Admitting: Ophthalmology

## 2023-09-08 DIAGNOSIS — H43813 Vitreous degeneration, bilateral: Secondary | ICD-10-CM

## 2023-09-08 DIAGNOSIS — H35033 Hypertensive retinopathy, bilateral: Secondary | ICD-10-CM | POA: Diagnosis not present

## 2023-09-08 DIAGNOSIS — I10 Essential (primary) hypertension: Secondary | ICD-10-CM | POA: Diagnosis not present

## 2023-09-08 DIAGNOSIS — H353231 Exudative age-related macular degeneration, bilateral, with active choroidal neovascularization: Secondary | ICD-10-CM | POA: Diagnosis not present

## 2023-09-22 DIAGNOSIS — H524 Presbyopia: Secondary | ICD-10-CM | POA: Diagnosis not present

## 2023-09-22 DIAGNOSIS — H35723 Serous detachment of retinal pigment epithelium, bilateral: Secondary | ICD-10-CM | POA: Diagnosis not present

## 2023-09-22 DIAGNOSIS — H35372 Puckering of macula, left eye: Secondary | ICD-10-CM | POA: Diagnosis not present

## 2023-09-22 DIAGNOSIS — H35373 Puckering of macula, bilateral: Secondary | ICD-10-CM | POA: Diagnosis not present

## 2023-09-22 DIAGNOSIS — H353231 Exudative age-related macular degeneration, bilateral, with active choroidal neovascularization: Secondary | ICD-10-CM | POA: Diagnosis not present

## 2023-09-22 DIAGNOSIS — H35033 Hypertensive retinopathy, bilateral: Secondary | ICD-10-CM | POA: Diagnosis not present

## 2023-10-06 ENCOUNTER — Encounter (INDEPENDENT_AMBULATORY_CARE_PROVIDER_SITE_OTHER): Admitting: Ophthalmology

## 2023-10-06 DIAGNOSIS — H353231 Exudative age-related macular degeneration, bilateral, with active choroidal neovascularization: Secondary | ICD-10-CM | POA: Diagnosis not present

## 2023-10-06 DIAGNOSIS — H43813 Vitreous degeneration, bilateral: Secondary | ICD-10-CM

## 2023-10-06 DIAGNOSIS — H35033 Hypertensive retinopathy, bilateral: Secondary | ICD-10-CM

## 2023-10-06 DIAGNOSIS — I10 Essential (primary) hypertension: Secondary | ICD-10-CM

## 2023-10-12 NOTE — Progress Notes (Signed)
 Remote pacemaker transmission.

## 2023-11-03 ENCOUNTER — Encounter (INDEPENDENT_AMBULATORY_CARE_PROVIDER_SITE_OTHER): Admitting: Ophthalmology

## 2023-11-03 DIAGNOSIS — H43813 Vitreous degeneration, bilateral: Secondary | ICD-10-CM

## 2023-11-03 DIAGNOSIS — H353231 Exudative age-related macular degeneration, bilateral, with active choroidal neovascularization: Secondary | ICD-10-CM

## 2023-11-03 DIAGNOSIS — H35033 Hypertensive retinopathy, bilateral: Secondary | ICD-10-CM

## 2023-11-03 DIAGNOSIS — I10 Essential (primary) hypertension: Secondary | ICD-10-CM | POA: Diagnosis not present

## 2023-11-22 DIAGNOSIS — R7301 Impaired fasting glucose: Secondary | ICD-10-CM | POA: Diagnosis not present

## 2023-11-22 DIAGNOSIS — E785 Hyperlipidemia, unspecified: Secondary | ICD-10-CM | POA: Diagnosis not present

## 2023-11-22 DIAGNOSIS — I129 Hypertensive chronic kidney disease with stage 1 through stage 4 chronic kidney disease, or unspecified chronic kidney disease: Secondary | ICD-10-CM | POA: Diagnosis not present

## 2023-11-22 DIAGNOSIS — N1831 Chronic kidney disease, stage 3a: Secondary | ICD-10-CM | POA: Diagnosis not present

## 2023-12-01 ENCOUNTER — Encounter (INDEPENDENT_AMBULATORY_CARE_PROVIDER_SITE_OTHER): Admitting: Ophthalmology

## 2023-12-02 ENCOUNTER — Ambulatory Visit

## 2023-12-02 DIAGNOSIS — I441 Atrioventricular block, second degree: Secondary | ICD-10-CM | POA: Diagnosis not present

## 2023-12-02 LAB — CUP PACEART REMOTE DEVICE CHECK
Battery Remaining Longevity: 94 mo
Battery Voltage: 3 V
Brady Statistic AP VP Percent: 19.6 %
Brady Statistic AP VS Percent: 0 %
Brady Statistic AS VP Percent: 80.4 %
Brady Statistic AS VS Percent: 0.01 %
Brady Statistic RA Percent Paced: 19.59 %
Brady Statistic RV Percent Paced: 99.99 %
Date Time Interrogation Session: 20250814054327
Implantable Lead Connection Status: 753985
Implantable Lead Connection Status: 753985
Implantable Lead Implant Date: 20100217
Implantable Lead Implant Date: 20100217
Implantable Lead Location: 753859
Implantable Lead Location: 753860
Implantable Lead Model: 5076
Implantable Lead Model: 5076
Implantable Pulse Generator Implant Date: 20220816
Lead Channel Impedance Value: 361 Ohm
Lead Channel Impedance Value: 361 Ohm
Lead Channel Impedance Value: 418 Ohm
Lead Channel Impedance Value: 437 Ohm
Lead Channel Pacing Threshold Amplitude: 0.5 V
Lead Channel Pacing Threshold Amplitude: 0.875 V
Lead Channel Pacing Threshold Pulse Width: 0.4 ms
Lead Channel Pacing Threshold Pulse Width: 0.4 ms
Lead Channel Sensing Intrinsic Amplitude: 1.875 mV
Lead Channel Sensing Intrinsic Amplitude: 1.875 mV
Lead Channel Sensing Intrinsic Amplitude: 10.125 mV
Lead Channel Sensing Intrinsic Amplitude: 10.125 mV
Lead Channel Setting Pacing Amplitude: 1.75 V
Lead Channel Setting Pacing Amplitude: 2.5 V
Lead Channel Setting Pacing Pulse Width: 0.4 ms
Lead Channel Setting Sensing Sensitivity: 4 mV
Zone Setting Status: 755011

## 2023-12-03 ENCOUNTER — Encounter (INDEPENDENT_AMBULATORY_CARE_PROVIDER_SITE_OTHER): Admitting: Ophthalmology

## 2023-12-03 DIAGNOSIS — H353231 Exudative age-related macular degeneration, bilateral, with active choroidal neovascularization: Secondary | ICD-10-CM

## 2023-12-03 DIAGNOSIS — H43813 Vitreous degeneration, bilateral: Secondary | ICD-10-CM

## 2023-12-03 DIAGNOSIS — H35033 Hypertensive retinopathy, bilateral: Secondary | ICD-10-CM

## 2023-12-03 DIAGNOSIS — I10 Essential (primary) hypertension: Secondary | ICD-10-CM

## 2023-12-06 DIAGNOSIS — Z0000 Encounter for general adult medical examination without abnormal findings: Secondary | ICD-10-CM | POA: Diagnosis not present

## 2023-12-06 DIAGNOSIS — N1831 Chronic kidney disease, stage 3a: Secondary | ICD-10-CM | POA: Diagnosis not present

## 2023-12-06 DIAGNOSIS — Z1339 Encounter for screening examination for other mental health and behavioral disorders: Secondary | ICD-10-CM | POA: Diagnosis not present

## 2023-12-06 DIAGNOSIS — I10 Essential (primary) hypertension: Secondary | ICD-10-CM | POA: Diagnosis not present

## 2023-12-06 DIAGNOSIS — Z950 Presence of cardiac pacemaker: Secondary | ICD-10-CM | POA: Diagnosis not present

## 2023-12-06 DIAGNOSIS — R2681 Unsteadiness on feet: Secondary | ICD-10-CM | POA: Diagnosis not present

## 2023-12-06 DIAGNOSIS — I129 Hypertensive chronic kidney disease with stage 1 through stage 4 chronic kidney disease, or unspecified chronic kidney disease: Secondary | ICD-10-CM | POA: Diagnosis not present

## 2023-12-06 DIAGNOSIS — G4733 Obstructive sleep apnea (adult) (pediatric): Secondary | ICD-10-CM | POA: Diagnosis not present

## 2023-12-06 DIAGNOSIS — Z1331 Encounter for screening for depression: Secondary | ICD-10-CM | POA: Diagnosis not present

## 2023-12-06 DIAGNOSIS — E785 Hyperlipidemia, unspecified: Secondary | ICD-10-CM | POA: Diagnosis not present

## 2023-12-06 DIAGNOSIS — R82998 Other abnormal findings in urine: Secondary | ICD-10-CM | POA: Diagnosis not present

## 2023-12-09 ENCOUNTER — Ambulatory Visit: Payer: Self-pay | Admitting: Cardiovascular Disease

## 2023-12-31 ENCOUNTER — Encounter (INDEPENDENT_AMBULATORY_CARE_PROVIDER_SITE_OTHER): Admitting: Ophthalmology

## 2023-12-31 DIAGNOSIS — I10 Essential (primary) hypertension: Secondary | ICD-10-CM | POA: Diagnosis not present

## 2023-12-31 DIAGNOSIS — H43813 Vitreous degeneration, bilateral: Secondary | ICD-10-CM

## 2023-12-31 DIAGNOSIS — H35033 Hypertensive retinopathy, bilateral: Secondary | ICD-10-CM

## 2023-12-31 DIAGNOSIS — H353231 Exudative age-related macular degeneration, bilateral, with active choroidal neovascularization: Secondary | ICD-10-CM | POA: Diagnosis not present

## 2024-01-13 NOTE — Progress Notes (Signed)
 Remote PPM Transmission

## 2024-01-19 DIAGNOSIS — L570 Actinic keratosis: Secondary | ICD-10-CM | POA: Diagnosis not present

## 2024-01-19 DIAGNOSIS — L720 Epidermal cyst: Secondary | ICD-10-CM | POA: Diagnosis not present

## 2024-01-19 DIAGNOSIS — D1801 Hemangioma of skin and subcutaneous tissue: Secondary | ICD-10-CM | POA: Diagnosis not present

## 2024-01-19 DIAGNOSIS — L813 Cafe au lait spots: Secondary | ICD-10-CM | POA: Diagnosis not present

## 2024-01-19 DIAGNOSIS — L814 Other melanin hyperpigmentation: Secondary | ICD-10-CM | POA: Diagnosis not present

## 2024-01-19 DIAGNOSIS — L821 Other seborrheic keratosis: Secondary | ICD-10-CM | POA: Diagnosis not present

## 2024-01-19 DIAGNOSIS — Z85828 Personal history of other malignant neoplasm of skin: Secondary | ICD-10-CM | POA: Diagnosis not present

## 2024-01-22 DIAGNOSIS — Z23 Encounter for immunization: Secondary | ICD-10-CM | POA: Diagnosis not present

## 2024-01-28 ENCOUNTER — Encounter (INDEPENDENT_AMBULATORY_CARE_PROVIDER_SITE_OTHER): Admitting: Ophthalmology

## 2024-01-28 DIAGNOSIS — H43813 Vitreous degeneration, bilateral: Secondary | ICD-10-CM

## 2024-01-28 DIAGNOSIS — H35033 Hypertensive retinopathy, bilateral: Secondary | ICD-10-CM

## 2024-01-28 DIAGNOSIS — I10 Essential (primary) hypertension: Secondary | ICD-10-CM

## 2024-01-28 DIAGNOSIS — H353231 Exudative age-related macular degeneration, bilateral, with active choroidal neovascularization: Secondary | ICD-10-CM | POA: Diagnosis not present

## 2024-02-25 ENCOUNTER — Encounter (INDEPENDENT_AMBULATORY_CARE_PROVIDER_SITE_OTHER): Admitting: Ophthalmology

## 2024-03-02 ENCOUNTER — Ambulatory Visit

## 2024-03-02 DIAGNOSIS — I441 Atrioventricular block, second degree: Secondary | ICD-10-CM | POA: Diagnosis not present

## 2024-03-03 LAB — CUP PACEART REMOTE DEVICE CHECK
Battery Remaining Longevity: 91 mo
Battery Voltage: 2.99 V
Brady Statistic AP VP Percent: 18.24 %
Brady Statistic AP VS Percent: 0 %
Brady Statistic AS VP Percent: 81.75 %
Brady Statistic AS VS Percent: 0.01 %
Brady Statistic RA Percent Paced: 18.25 %
Brady Statistic RV Percent Paced: 99.99 %
Date Time Interrogation Session: 20251112231831
Implantable Lead Connection Status: 753985
Implantable Lead Connection Status: 753985
Implantable Lead Implant Date: 20100217
Implantable Lead Implant Date: 20100217
Implantable Lead Location: 753859
Implantable Lead Location: 753860
Implantable Lead Model: 5076
Implantable Lead Model: 5076
Implantable Pulse Generator Implant Date: 20220816
Lead Channel Impedance Value: 361 Ohm
Lead Channel Impedance Value: 361 Ohm
Lead Channel Impedance Value: 418 Ohm
Lead Channel Impedance Value: 437 Ohm
Lead Channel Pacing Threshold Amplitude: 0.5 V
Lead Channel Pacing Threshold Amplitude: 0.75 V
Lead Channel Pacing Threshold Pulse Width: 0.4 ms
Lead Channel Pacing Threshold Pulse Width: 0.4 ms
Lead Channel Sensing Intrinsic Amplitude: 10.125 mV
Lead Channel Sensing Intrinsic Amplitude: 10.125 mV
Lead Channel Sensing Intrinsic Amplitude: 2 mV
Lead Channel Sensing Intrinsic Amplitude: 2 mV
Lead Channel Setting Pacing Amplitude: 1.5 V
Lead Channel Setting Pacing Amplitude: 2.5 V
Lead Channel Setting Pacing Pulse Width: 0.4 ms
Lead Channel Setting Sensing Sensitivity: 4 mV
Zone Setting Status: 755011

## 2024-03-07 NOTE — Progress Notes (Signed)
 Remote PPM Transmission

## 2024-03-09 DIAGNOSIS — H3530 Unspecified macular degeneration: Secondary | ICD-10-CM | POA: Diagnosis not present

## 2024-03-09 DIAGNOSIS — H353231 Exudative age-related macular degeneration, bilateral, with active choroidal neovascularization: Secondary | ICD-10-CM | POA: Diagnosis not present

## 2024-03-09 DIAGNOSIS — Z961 Presence of intraocular lens: Secondary | ICD-10-CM | POA: Diagnosis not present

## 2024-03-20 ENCOUNTER — Ambulatory Visit: Payer: Self-pay | Admitting: Cardiovascular Disease

## 2024-04-06 ENCOUNTER — Encounter (INDEPENDENT_AMBULATORY_CARE_PROVIDER_SITE_OTHER): Admitting: Ophthalmology

## 2024-04-06 DIAGNOSIS — H43813 Vitreous degeneration, bilateral: Secondary | ICD-10-CM

## 2024-04-06 DIAGNOSIS — I10 Essential (primary) hypertension: Secondary | ICD-10-CM | POA: Diagnosis not present

## 2024-04-06 DIAGNOSIS — H353231 Exudative age-related macular degeneration, bilateral, with active choroidal neovascularization: Secondary | ICD-10-CM | POA: Diagnosis not present

## 2024-04-06 DIAGNOSIS — H35033 Hypertensive retinopathy, bilateral: Secondary | ICD-10-CM | POA: Diagnosis not present

## 2024-05-04 ENCOUNTER — Encounter (INDEPENDENT_AMBULATORY_CARE_PROVIDER_SITE_OTHER): Admitting: Ophthalmology

## 2024-05-04 DIAGNOSIS — I10 Essential (primary) hypertension: Secondary | ICD-10-CM | POA: Diagnosis not present

## 2024-05-04 DIAGNOSIS — H43813 Vitreous degeneration, bilateral: Secondary | ICD-10-CM | POA: Diagnosis not present

## 2024-05-04 DIAGNOSIS — H353231 Exudative age-related macular degeneration, bilateral, with active choroidal neovascularization: Secondary | ICD-10-CM | POA: Diagnosis not present

## 2024-05-04 DIAGNOSIS — H35033 Hypertensive retinopathy, bilateral: Secondary | ICD-10-CM | POA: Diagnosis not present

## 2024-06-01 ENCOUNTER — Encounter (INDEPENDENT_AMBULATORY_CARE_PROVIDER_SITE_OTHER): Admitting: Ophthalmology

## 2024-06-01 ENCOUNTER — Encounter

## 2024-06-16 ENCOUNTER — Encounter

## 2024-08-31 ENCOUNTER — Encounter

## 2024-11-30 ENCOUNTER — Encounter
